# Patient Record
Sex: Male | Born: 1965 | Race: White | Hispanic: No | Smoking: Current some day smoker
Health system: Southern US, Community
[De-identification: ages and names within clinical notes are randomized; demographics above are authoritative.]

## PROBLEM LIST (undated history)

## (undated) DIAGNOSIS — N200 Calculus of kidney: Secondary | ICD-10-CM

## (undated) DIAGNOSIS — F1910 Other psychoactive substance abuse, uncomplicated: Secondary | ICD-10-CM

## (undated) DIAGNOSIS — F329 Major depressive disorder, single episode, unspecified: Secondary | ICD-10-CM

## (undated) DIAGNOSIS — F32A Depression, unspecified: Secondary | ICD-10-CM

## (undated) DIAGNOSIS — F4310 Post-traumatic stress disorder, unspecified: Secondary | ICD-10-CM

## (undated) DIAGNOSIS — E785 Hyperlipidemia, unspecified: Secondary | ICD-10-CM

## (undated) DIAGNOSIS — M545 Low back pain, unspecified: Secondary | ICD-10-CM

## (undated) DIAGNOSIS — F419 Anxiety disorder, unspecified: Secondary | ICD-10-CM

## (undated) DIAGNOSIS — J45909 Unspecified asthma, uncomplicated: Secondary | ICD-10-CM

## (undated) DIAGNOSIS — F1010 Alcohol abuse, uncomplicated: Secondary | ICD-10-CM

## (undated) DIAGNOSIS — J189 Pneumonia, unspecified organism: Secondary | ICD-10-CM

## (undated) HISTORY — PX: THUMB FUSION: SUR636

## (undated) HISTORY — PX: BACK SURGERY: SHX140

## (undated) HISTORY — PX: OPEN REDUCTION INTERNAL FIXATION (ORIF) HAND: SHX5991

---

## 2001-05-15 ENCOUNTER — Encounter: Payer: Self-pay | Admitting: Emergency Medicine

## 2001-05-15 ENCOUNTER — Emergency Department (HOSPITAL_COMMUNITY): Admission: EM | Admit: 2001-05-15 | Discharge: 2001-05-15 | Payer: Self-pay | Admitting: Emergency Medicine

## 2001-05-17 ENCOUNTER — Emergency Department (HOSPITAL_COMMUNITY): Admission: EM | Admit: 2001-05-17 | Discharge: 2001-05-17 | Payer: Self-pay | Admitting: Emergency Medicine

## 2002-11-08 ENCOUNTER — Ambulatory Visit (HOSPITAL_COMMUNITY): Admission: RE | Admit: 2002-11-08 | Discharge: 2002-11-08 | Payer: Self-pay | Admitting: *Deleted

## 2003-11-22 ENCOUNTER — Emergency Department (HOSPITAL_COMMUNITY): Admission: EM | Admit: 2003-11-22 | Discharge: 2003-11-22 | Payer: Self-pay | Admitting: Internal Medicine

## 2004-02-23 ENCOUNTER — Emergency Department (HOSPITAL_COMMUNITY): Admission: EM | Admit: 2004-02-23 | Discharge: 2004-02-23 | Payer: Self-pay | Admitting: Emergency Medicine

## 2004-12-02 ENCOUNTER — Emergency Department (HOSPITAL_COMMUNITY): Admission: EM | Admit: 2004-12-02 | Discharge: 2004-12-02 | Payer: Self-pay | Admitting: Emergency Medicine

## 2005-07-06 ENCOUNTER — Emergency Department (HOSPITAL_COMMUNITY): Admission: EM | Admit: 2005-07-06 | Discharge: 2005-07-06 | Payer: Self-pay | Admitting: Emergency Medicine

## 2005-10-25 ENCOUNTER — Ambulatory Visit (HOSPITAL_COMMUNITY): Admission: RE | Admit: 2005-10-25 | Discharge: 2005-10-25 | Payer: Self-pay | Admitting: Family Medicine

## 2006-09-05 ENCOUNTER — Emergency Department (HOSPITAL_COMMUNITY): Admission: EM | Admit: 2006-09-05 | Discharge: 2006-09-06 | Payer: Self-pay | Admitting: Emergency Medicine

## 2006-09-11 ENCOUNTER — Emergency Department (HOSPITAL_COMMUNITY): Admission: EM | Admit: 2006-09-11 | Discharge: 2006-09-11 | Payer: Self-pay | Admitting: Emergency Medicine

## 2008-12-25 ENCOUNTER — Emergency Department (HOSPITAL_COMMUNITY): Admission: EM | Admit: 2008-12-25 | Discharge: 2008-12-25 | Payer: Self-pay | Admitting: Emergency Medicine

## 2012-09-03 ENCOUNTER — Emergency Department (HOSPITAL_COMMUNITY)
Admission: EM | Admit: 2012-09-03 | Discharge: 2012-09-03 | Disposition: A | Discharge status: Home / Self Care | Payer: Worker's Compensation | Attending: Emergency Medicine | Admitting: Emergency Medicine

## 2012-09-03 ENCOUNTER — Emergency Department (HOSPITAL_COMMUNITY): Payer: Worker's Compensation

## 2012-09-03 ENCOUNTER — Encounter (HOSPITAL_COMMUNITY): Payer: Self-pay

## 2012-09-03 DIAGNOSIS — Y9389 Activity, other specified: Secondary | ICD-10-CM | POA: Insufficient documentation

## 2012-09-03 DIAGNOSIS — IMO0001 Reserved for inherently not codable concepts without codable children: Secondary | ICD-10-CM

## 2012-09-03 DIAGNOSIS — F17200 Nicotine dependence, unspecified, uncomplicated: Secondary | ICD-10-CM | POA: Insufficient documentation

## 2012-09-03 DIAGNOSIS — S62609A Fracture of unspecified phalanx of unspecified finger, initial encounter for closed fracture: Secondary | ICD-10-CM | POA: Insufficient documentation

## 2012-09-03 DIAGNOSIS — Y9289 Other specified places as the place of occurrence of the external cause: Secondary | ICD-10-CM | POA: Insufficient documentation

## 2012-09-03 DIAGNOSIS — X500XXA Overexertion from strenuous movement or load, initial encounter: Secondary | ICD-10-CM | POA: Insufficient documentation

## 2012-09-03 MED ORDER — IBUPROFEN 800 MG PO TABS: 800.0000 mg | ORAL_TABLET | Freq: Three times a day (TID) | ORAL | Status: DC

## 2012-09-03 MED ORDER — IBUPROFEN 800 MG PO TABS
800.0000 mg | ORAL_TABLET | Freq: Once | ORAL | Status: AC
  Administered 2012-09-03: 800 mg via ORAL
  Filled 2012-09-03: qty 1

## 2012-09-03 MED ORDER — OXYCODONE-ACETAMINOPHEN 5-325 MG PO TABS: 1.0000 | ORAL_TABLET | ORAL | Status: AC | PRN

## 2012-09-03 MED ORDER — OXYCODONE-ACETAMINOPHEN 5-325 MG PO TABS
1.0000 | ORAL_TABLET | Freq: Once | ORAL | Status: AC
  Administered 2012-09-03: 1 via ORAL
  Filled 2012-09-03: qty 1

## 2012-09-03 NOTE — ED Notes (Signed)
Pt reports was at work Friday and r thumb was bent backwards.  Swelling to thumb and at base of thumb.

## 2012-09-03 NOTE — ED Provider Notes (Signed)
History     CSN: [id number]  Arrival date & time 09/03/12  4098   First MD Initiated Contact with Patient 09/03/12 0920      Chief Complaint  Patient presents with  . Hand Pain    (Consider location/radiation/quality/duration/timing/severity/associated sxs/prior treatment) HPI Comments: Patient c/o pain and swelling to the right thumb for 3 days.  Pain began after a work related injury that caused a hyperextension of the joint. Patient had a heavy weight in his hand at the time of the injury.  States the pain radiates into his wrist.  He has not tried to apply ice, has taken aleve w/o relief of pain.  He denies numbness of the fingers, elbow or shoulder pain  Patient is a 46 y.o. male presenting with hand pain. The history is provided by the patient.  Hand Pain This is a new problem. The current episode started in the past 7 days. The problem occurs constantly. The problem has been unchanged. Associated symptoms include arthralgias and joint swelling. Pertinent negatives include no chest pain, chills, fever, neck pain, numbness, rash or weakness. The symptoms are aggravated by bending (movement and palpation). He has tried nothing for the symptoms. The treatment provided no relief.    History reviewed. No pertinent past medical history.  Past Surgical History  Procedure Date  . Back surgery     No family history on file.  History  Substance Use Topics  . Smoking status: Current Every Day Smoker  . Smokeless tobacco: Not on file  . Alcohol Use: Yes     Comment: occ-  drank heavy last night      Review of Systems  Constitutional: Negative for fever and chills.  HENT: Negative for neck pain.   Cardiovascular: Negative for chest pain.  Genitourinary: Negative for dysuria and difficulty urinating.  Musculoskeletal: Positive for joint swelling and arthralgias.  Skin: Negative for color change, rash and wound.  Neurological: Negative for weakness and numbness.  All other  systems reviewed and are negative.    Allergies  Penicillins  Home Medications   Current Outpatient Rx  Name  Route  Sig  Dispense  Refill  . ALBUTEROL SULFATE HFA 108 (90 BASE) MCG/ACT IN AERS   Inhalation   Inhale 2 puffs into the lungs every 6 (six) hours as needed. Asthma         . ASPIRIN 325 MG PO TBEC   Oral   Take 650 mg by mouth daily as needed. Pain         . ADULT MULTIVITAMIN W/MINERALS CH   Oral   Take 1 tablet by mouth daily.         Marland Kitchen NAPROXEN SODIUM 220 MG PO TABS   Oral   Take 220-440 mg by mouth 2 (two) times daily as needed. Pain           BP 130/79  Pulse 100  Temp 98.4 F (36.9 C) (Oral)  Resp 18  Ht 5\' 7"  (1.702 m)  Wt 175 lb (79.379 kg)  BMI 27.41 kg/m2  SpO2 100%  Physical Exam  Nursing note and vitals reviewed. Constitutional: He is oriented to person, place, and time. He appears well-developed and well-nourished. No distress.  HENT:  Head: Normocephalic and atraumatic.  Cardiovascular: Normal rate, regular rhythm and normal heart sounds.   Pulmonary/Chest: Effort normal and breath sounds normal.  Musculoskeletal: He exhibits edema and tenderness.       Right hand: He exhibits decreased range of motion, tenderness,  bony tenderness and swelling. He exhibits normal two-point discrimination, normal capillary refill, no deformity and no laceration. normal sensation noted. Normal strength noted.       Hands:      ttp of the base of the right thumb with moderate STS of the thenar eminence and dorsal surface of thumb and dorsal hand.  Radial pulse is brisk, distal sensation intact.  CR< 2 sec.    Neurological: He is alert and oriented to person, place, and time. He exhibits normal muscle tone. Coordination normal.  Skin: Skin is warm and dry.    ED Course  Procedures (including critical care time)  Labs Reviewed - No data to display Dg Wrist Complete Right  09/03/2012  *RADIOLOGY REPORT*  Clinical Data: Hand pain  RIGHT WRIST -  COMPLETE 3+ VIEW  Comparison:  None.  Findings: There is soft tissue swelling ventral to the distal radius suggesting a hematoma displacing the pronator fat pad.  There is a probable fracture at the base of the first metacarpal. While this could represent an ossicle, it has angular margins suggestive of fracture.  Correlate with pain location.  Chronic healed fractures of the fourth and fifth metacarpals.  IMPRESSION: Probable fracture at the base of the thumb which could be acute.  Chronic fracture fourth and fifth metacarpals.  Displaced pronator fat pad suggesting hematoma.   Original Report Authenticated By: Janeece Riggers, M.D.    Dg Finger Thumb Right  09/03/2012  *RADIOLOGY REPORT*  Clinical Data: Thumb pain after injury.  RIGHT THUMB 2+V  Comparison: None.  Findings: No acute osseous or joint abnormality.  IMPRESSION: No acute osseous or joint abnormality.   Original Report Authenticated By: Leanna Battles, M.D.      Fiberglass thumb spice splint applied, pain improved, remains NV intact.     MDM     Pt has a 3 day old injury to the base of the right thumb with significant swelling of the thenar eminence and dorsal aspect of the proximal thumb.  Radial pulse is brisk, CR < 3 sec.  Distal sensation intact.  I have advised pt that he will need close orthopedic follow-up.  Will give referral to hand surgeon.  Pt agrees to RICE therapy  Prescribed: Percocet #20 ibuprofen     Jamont Mellin L. [place], [place] 09/04/12 2127

## 2012-09-06 NOTE — ED Provider Notes (Signed)
Medical screening examination/treatment/procedure(s) were performed by non-physician practitioner and as supervising physician I was immediately available for consultation/collaboration. Devoria Albe, MD, FACEP   Reviewed xrays with PA  Ward Givens, MD 09/06/12 [phone]

## 2012-10-24 ENCOUNTER — Ambulatory Visit (HOSPITAL_COMMUNITY)
Admission: AD | Admit: 2012-10-24 | Discharge: 2012-10-24 | Disposition: A | Discharge status: Home / Self Care | Payer: Worker's Compensation | Source: Ambulatory Visit | Attending: Orthopedic Surgery | Admitting: Orthopedic Surgery

## 2012-10-24 ENCOUNTER — Ambulatory Visit (HOSPITAL_COMMUNITY): Payer: Worker's Compensation

## 2012-10-24 ENCOUNTER — Encounter (HOSPITAL_COMMUNITY)
Admission: AD | Disposition: A | Discharge status: Home / Self Care | Payer: Self-pay | Source: Ambulatory Visit | Attending: Orthopedic Surgery

## 2012-10-24 ENCOUNTER — Ambulatory Visit (HOSPITAL_COMMUNITY): Payer: Worker's Compensation | Admitting: Anesthesiology

## 2012-10-24 ENCOUNTER — Encounter (HOSPITAL_COMMUNITY): Payer: Self-pay | Admitting: Anesthesiology

## 2012-10-24 ENCOUNTER — Other Ambulatory Visit: Payer: Self-pay | Admitting: Orthopedic Surgery

## 2012-10-24 ENCOUNTER — Encounter (HOSPITAL_COMMUNITY): Payer: Self-pay | Admitting: *Deleted

## 2012-10-24 DIAGNOSIS — M86149 Other acute osteomyelitis, unspecified hand: Secondary | ICD-10-CM | POA: Insufficient documentation

## 2012-10-24 DIAGNOSIS — T847XXA Infection and inflammatory reaction due to other internal orthopedic prosthetic devices, implants and grafts, initial encounter: Secondary | ICD-10-CM | POA: Insufficient documentation

## 2012-10-24 DIAGNOSIS — Z79899 Other long term (current) drug therapy: Secondary | ICD-10-CM | POA: Insufficient documentation

## 2012-10-24 DIAGNOSIS — L0889 Other specified local infections of the skin and subcutaneous tissue: Secondary | ICD-10-CM | POA: Insufficient documentation

## 2012-10-24 DIAGNOSIS — Y838 Other surgical procedures as the cause of abnormal reaction of the patient, or of later complication, without mention of misadventure at the time of the procedure: Secondary | ICD-10-CM | POA: Insufficient documentation

## 2012-10-24 DIAGNOSIS — F17200 Nicotine dependence, unspecified, uncomplicated: Secondary | ICD-10-CM | POA: Insufficient documentation

## 2012-10-24 HISTORY — PX: I & D EXTREMITY: SHX5045

## 2012-10-24 LAB — CBC WITH DIFFERENTIAL/PLATELET
HCT: 42.8 % (ref 39.0–52.0)
Hemoglobin: 14.8 g/dL (ref 13.0–17.0)
Lymphocytes Relative: 22 % (ref 12–46)
Lymphs Abs: 2.2 10*3/uL (ref 0.7–4.0)
Monocytes Absolute: 0.8 10*3/uL (ref 0.1–1.0)
Monocytes Relative: 8 % (ref 3–12)
Neutro Abs: 7 10*3/uL (ref 1.7–7.7)
Neutrophils Relative %: 68 % (ref 43–77)
RBC: 4.86 MIL/uL (ref 4.22–5.81)

## 2012-10-24 LAB — BASIC METABOLIC PANEL
BUN: 11 mg/dL (ref 6–23)
CO2: 26 mEq/L (ref 19–32)
Chloride: 99 mEq/L (ref 96–112)
Creatinine, Ser: 0.73 mg/dL (ref 0.50–1.35)
GFR calc Af Amer: >90 mL/min (ref >90)
Glucose, Bld: 97 mg/dL (ref 70–99)
Potassium: 3.3 mEq/L — ABNORMAL LOW (ref 3.5–5.1)

## 2012-10-24 SURGERY — IRRIGATION AND DEBRIDEMENT EXTREMITY
Anesthesia: General | Site: Thumb | Laterality: Right | Wound class: Dirty or Infected

## 2012-10-24 MED ORDER — LIDOCAINE HCL 1 % IJ SOLN
INTRAMUSCULAR | Status: DC | PRN
  Administered 2012-10-24: 40 mg via INTRADERMAL

## 2012-10-24 MED ORDER — MUPIROCIN 2 % EX OINT
TOPICAL_OINTMENT | Freq: Two times a day (BID) | CUTANEOUS | Status: DC
  Administered 2012-10-24: 1 via NASAL

## 2012-10-24 MED ORDER — HYDROMORPHONE HCL PF 1 MG/ML IJ SOLN
INTRAMUSCULAR | Status: AC
  Filled 2012-10-24: qty 1

## 2012-10-24 MED ORDER — SODIUM CHLORIDE 0.9 % IR SOLN
Status: DC | PRN
  Administered 2012-10-24: 1000 mL
  Administered 2012-10-24: 3000 mL

## 2012-10-24 MED ORDER — MUPIROCIN 2 % EX OINT
TOPICAL_OINTMENT | CUTANEOUS | Status: AC
  Administered 2012-10-24: 1 via NASAL
  Filled 2012-10-24: qty 22

## 2012-10-24 MED ORDER — OXYCODONE HCL 5 MG/5ML PO SOLN: 5.0000 mg | Freq: Once | ORAL | Status: DC | PRN

## 2012-10-24 MED ORDER — LACTATED RINGERS IV SOLN
INTRAVENOUS | Status: DC | PRN
  Administered 2012-10-24 (×2): via INTRAVENOUS

## 2012-10-24 MED ORDER — FENTANYL CITRATE 0.05 MG/ML IJ SOLN
INTRAMUSCULAR | Status: DC | PRN
  Administered 2012-10-24 (×2): 50 ug via INTRAVENOUS
  Administered 2012-10-24: 25 ug via INTRAVENOUS
  Administered 2012-10-24: 50 ug via INTRAVENOUS
  Administered 2012-10-24: 25 ug via INTRAVENOUS
  Administered 2012-10-24: 50 ug via INTRAVENOUS

## 2012-10-24 MED ORDER — PHENYLEPHRINE HCL 10 MG/ML IJ SOLN
INTRAMUSCULAR | Status: DC | PRN
  Administered 2012-10-24 (×2): 40 ug via INTRAVENOUS
  Administered 2012-10-24: 80 ug via INTRAVENOUS

## 2012-10-24 MED ORDER — VECURONIUM BROMIDE 10 MG IV SOLR
INTRAVENOUS | Status: DC | PRN
  Administered 2012-10-24: 2 mg via INTRAVENOUS

## 2012-10-24 MED ORDER — MIDAZOLAM HCL 5 MG/5ML IJ SOLN
INTRAMUSCULAR | Status: DC | PRN
  Administered 2012-10-24: 2 mg via INTRAVENOUS

## 2012-10-24 MED ORDER — VANCOMYCIN HCL IN DEXTROSE 1-5 GM/200ML-% IV SOLN: 1000.0000 mg | INTRAVENOUS | Status: DC

## 2012-10-24 MED ORDER — VANCOMYCIN HCL IN DEXTROSE 1-5 GM/200ML-% IV SOLN
INTRAVENOUS | Status: AC
  Filled 2012-10-24: qty 200

## 2012-10-24 MED ORDER — VANCOMYCIN HCL 1000 MG IV SOLR
INTRAVENOUS | Status: DC | PRN
  Administered 2012-10-24: 1000 mg

## 2012-10-24 MED ORDER — GLYCOPYRROLATE 0.2 MG/ML IJ SOLN
INTRAMUSCULAR | Status: DC | PRN
  Administered 2012-10-24: 0.2 mg via INTRAVENOUS

## 2012-10-24 MED ORDER — NEOSTIGMINE METHYLSULFATE 1 MG/ML IJ SOLN
INTRAMUSCULAR | Status: DC | PRN
  Administered 2012-10-24: 1 mg via INTRAVENOUS

## 2012-10-24 MED ORDER — ONDANSETRON HCL 4 MG/2ML IJ SOLN
INTRAMUSCULAR | Status: DC | PRN
  Administered 2012-10-24: 4 mg via INTRAVENOUS

## 2012-10-24 MED ORDER — OXYCODONE HCL 5 MG PO TABS: 5.0000 mg | ORAL_TABLET | Freq: Once | ORAL | Status: DC | PRN

## 2012-10-24 MED ORDER — HYDROMORPHONE HCL PF 1 MG/ML IJ SOLN
0.2500 mg | INTRAMUSCULAR | Status: DC | PRN
  Administered 2012-10-24 (×4): 0.5 mg via INTRAVENOUS

## 2012-10-24 MED ORDER — PROPOFOL 10 MG/ML IV BOLUS
INTRAVENOUS | Status: DC | PRN
  Administered 2012-10-24: 160 mg via INTRAVENOUS

## 2012-10-24 MED ORDER — BUPIVACAINE HCL (PF) 0.25 % IJ SOLN
INTRAMUSCULAR | Status: DC | PRN
  Administered 2012-10-24: 8 mL

## 2012-10-24 MED ORDER — OXYCODONE-ACETAMINOPHEN 5-325 MG PO TABS: ORAL_TABLET | ORAL | Status: DC

## 2012-10-24 MED ORDER — SULFAMETHOXAZOLE-TRIMETHOPRIM 800-160 MG PO TABS: 1.0000 | ORAL_TABLET | Freq: Two times a day (BID) | ORAL | Status: DC

## 2012-10-24 MED ORDER — BUPIVACAINE HCL (PF) 0.25 % IJ SOLN
INTRAMUSCULAR | Status: AC
  Filled 2012-10-24: qty 30

## 2012-10-24 SURGICAL SUPPLY — 57 items
BANDAGE COBAN STERILE 2 (GAUZE/BANDAGES/DRESSINGS) IMPLANT
BANDAGE CONFORM 2  STR LF (GAUZE/BANDAGES/DRESSINGS) IMPLANT
BANDAGE ELASTIC 3 VELCRO ST LF (GAUZE/BANDAGES/DRESSINGS) ×2 IMPLANT
BANDAGE ELASTIC 4 VELCRO ST LF (GAUZE/BANDAGES/DRESSINGS) ×2 IMPLANT
BANDAGE GAUZE ELAST BULKY 4 IN (GAUZE/BANDAGES/DRESSINGS) ×2 IMPLANT
BNDG COHESIVE 1X5 TAN STRL LF (GAUZE/BANDAGES/DRESSINGS) IMPLANT
BNDG ESMARK 4X9 LF (GAUZE/BANDAGES/DRESSINGS) ×2 IMPLANT
CANISTER SUCTION 1500CC (MISCELLANEOUS) ×2 IMPLANT
CLOTH BEACON ORANGE TIMEOUT ST (SAFETY) ×2 IMPLANT
CORDS BIPOLAR (ELECTRODE) ×2 IMPLANT
COVER SURGICAL LIGHT HANDLE (MISCELLANEOUS) ×2 IMPLANT
DECANTER SPIKE VIAL GLASS SM (MISCELLANEOUS) IMPLANT
DRAIN PENROSE 1/4X12 LTX STRL (WOUND CARE) IMPLANT
DRSG ADAPTIC 3X8 NADH LF (GAUZE/BANDAGES/DRESSINGS) IMPLANT
DRSG EMULSION OIL 3X3 NADH (GAUZE/BANDAGES/DRESSINGS) ×2 IMPLANT
DRSG PAD ABDOMINAL 8X10 ST (GAUZE/BANDAGES/DRESSINGS) ×4 IMPLANT
GAUZE PACKING IODOFORM 1/4X5 (PACKING) ×2 IMPLANT
GAUZE XEROFORM 1X8 LF (GAUZE/BANDAGES/DRESSINGS) ×2 IMPLANT
GLOVE BIO SURGEON STRL SZ7.5 (GLOVE) ×2 IMPLANT
GLOVE BIOGEL PI IND STRL 8 (GLOVE) ×1 IMPLANT
GLOVE BIOGEL PI INDICATOR 8 (GLOVE) ×1
GOWN STRL REIN XL XLG (GOWN DISPOSABLE) ×2 IMPLANT
HANDPIECE INTERPULSE COAX TIP (DISPOSABLE)
KIT BASIN OR (CUSTOM PROCEDURE TRAY) ×2 IMPLANT
KIT ROOM TURNOVER OR (KITS) ×2 IMPLANT
LOOP VESSEL MAXI BLUE (MISCELLANEOUS) IMPLANT
LOOP VESSEL MINI RED (MISCELLANEOUS) IMPLANT
MANIFOLD NEPTUNE II (INSTRUMENTS) ×2 IMPLANT
NEEDLE HYPO 25X1 1.5 SAFETY (NEEDLE) ×2 IMPLANT
NS IRRIG 1000ML POUR BTL (IV SOLUTION) ×2 IMPLANT
PACK ORTHO EXTREMITY (CUSTOM PROCEDURE TRAY) ×2 IMPLANT
PAD ARMBOARD 7.5X6 YLW CONV (MISCELLANEOUS) ×2 IMPLANT
PADDING CAST ABS 3INX4YD NS (CAST SUPPLIES) ×1
PADDING CAST ABS COTTON 3X4 (CAST SUPPLIES) ×1 IMPLANT
SCRUB BETADINE 4OZ XXX (MISCELLANEOUS) ×2 IMPLANT
SET HNDPC FAN SPRY TIP SCT (DISPOSABLE) IMPLANT
SOLUTION BETADINE 4OZ (MISCELLANEOUS) ×2 IMPLANT
SPLINT PLASTER EXTRA FAST 3X15 (CAST SUPPLIES) ×1
SPLINT PLASTER GYPS XFAST 3X15 (CAST SUPPLIES) ×1 IMPLANT
SPONGE GAUZE 4X4 12PLY (GAUZE/BANDAGES/DRESSINGS) ×2 IMPLANT
SPONGE LAP 18X18 X RAY DECT (DISPOSABLE) IMPLANT
SPONGE LAP 4X18 X RAY DECT (DISPOSABLE) ×2 IMPLANT
SUCTION FRAZIER TIP 10 FR DISP (SUCTIONS) ×2 IMPLANT
SUT ETHILON 4 0 PS 2 18 (SUTURE) ×2 IMPLANT
SUT MON AB 5-0 P3 18 (SUTURE) IMPLANT
SWAB CULTURE LIQUID MINI MALE (MISCELLANEOUS) ×4 IMPLANT
SYR 20CC LL (SYRINGE) ×2 IMPLANT
SYR CONTROL 10ML LL (SYRINGE) ×2 IMPLANT
TOWEL OR 17X24 6PK STRL BLUE (TOWEL DISPOSABLE) ×2 IMPLANT
TOWEL OR 17X26 10 PK STRL BLUE (TOWEL DISPOSABLE) ×2 IMPLANT
TUBE ANAEROBIC SPECIMEN COL (MISCELLANEOUS) ×4 IMPLANT
TUBE CONNECTING 12X1/4 (SUCTIONS) ×2 IMPLANT
TUBE FEEDING 5FR 15 INCH (TUBING) IMPLANT
TUBING CYSTO DISP (UROLOGICAL SUPPLIES) ×2 IMPLANT
UNDERPAD 30X30 INCONTINENT (UNDERPADS AND DIAPERS) ×2 IMPLANT
WATER STERILE IRR 1000ML POUR (IV SOLUTION) IMPLANT
YANKAUER SUCT BULB TIP NO VENT (SUCTIONS) IMPLANT

## 2012-10-24 NOTE — Anesthesia Postprocedure Evaluation (Signed)
  Anesthesia Post-op Note  Patient: Jacob Reilly  Procedure(s) Performed: Procedure(s) (LRB) with comments: IRRIGATION AND DEBRIDEMENT EXTREMITY (Right)  Patient Location: PACU  Anesthesia Type:General  Level of Consciousness: awake, alert  and oriented  Airway and Oxygen Therapy: Patient Spontanous Breathing  Post-op Pain: mild  Post-op Assessment: Post-op Vital signs reviewed, Patient's Cardiovascular Status Stable, Respiratory Function Stable, Patent Airway and No signs of Nausea or vomiting  Post-op Vital Signs: Reviewed and stable  Complications: No apparent anesthesia complications

## 2012-10-24 NOTE — Transfer of Care (Signed)
Immediate Anesthesia Transfer of Care Note  Patient: Jacob Reilly  Procedure(s) Performed: Procedure(s) (LRB) with comments: IRRIGATION AND DEBRIDEMENT EXTREMITY (Right)  Patient Location: PACU  Anesthesia Type:General  Level of Consciousness: awake, alert  and oriented  Airway & Oxygen Therapy: Patient Spontanous Breathing and Patient connected to face mask oxygen  Post-op Assessment: Report given to PACU RN, Post -op Vital signs reviewed and stable and Patient moving all extremities  Post vital signs: Reviewed and stable  Complications: No apparent anesthesia complications

## 2012-10-24 NOTE — Preoperative (Signed)
Beta Blockers   Reason not to administer Beta Blockers:Not Applicable 

## 2012-10-24 NOTE — Brief Op Note (Signed)
10/24/2012  9:36 PM  PATIENT:  Jacob Reilly  46 y.o. male  PRE-OPERATIVE DIAGNOSIS:  Right Thumb CMC Joint Infection  POST-OPERATIVE DIAGNOSIS:  Right Thumb CMC Joint Infection  PROCEDURE:  Procedure(s) (LRB) with comments: IRRIGATION AND DEBRIDEMENT EXTREMITY (Right)  SURGEON:  Surgeon(s) and Role:    * Tami Ribas, MD - Primary  PHYSICIAN ASSISTANT:   ASSISTANTS: none   ANESTHESIA:   general  EBL:     BLOOD ADMINISTERED:none  DRAINS: Vessel loop drain in thumb metacarpal and cmc joint, iodoform packing in wound  LOCAL MEDICATIONS USED:  MARCAINE     SPECIMEN:  Source of Specimen:  right thumb abscess and thumb metacarpal   DISPOSITION OF SPECIMEN:  micro  COUNTS:  YES  TOURNIQUET:  * Missing tourniquet times found for documented tourniquets in log:  [id number] *  DICTATION: .Other Dictation: Dictation Number [id number]  PLAN OF CARE: Discharge to home after PACU  PATIENT DISPOSITION:  PACU - hemodynamically stable.

## 2012-10-24 NOTE — Anesthesia Preprocedure Evaluation (Signed)
Anesthesia Evaluation  Patient identified by MRN, date of birth, ID band Patient awake    Reviewed: Allergy & Precautions, H&P , NPO status , Patient's Chart, lab work & pertinent test results  Airway Mallampati: I TM Distance: >3 FB Neck ROM: Full    Dental No notable dental hx. (+) Teeth Intact and Dental Advisory Given   Pulmonary neg pulmonary ROS,  breath sounds clear to auscultation  Pulmonary exam normal       Cardiovascular negative cardio ROS  Rhythm:Regular Rate:Normal     Neuro/Psych negative neurological ROS  negative psych ROS   GI/Hepatic negative GI ROS, Neg liver ROS,   Endo/Other  negative endocrine ROS  Renal/GU negative Renal ROS  negative genitourinary   Musculoskeletal   Abdominal   Peds  Hematology negative hematology ROS (+)   Anesthesia Other Findings   Reproductive/Obstetrics negative OB ROS                           Anesthesia Physical Anesthesia Plan  ASA: I and emergent  Anesthesia Plan: General   Post-op Pain Management:    Induction: Intravenous, Rapid sequence and Cricoid pressure planned  Airway Management Planned: Oral ETT  Additional Equipment:   Intra-op Plan:   Post-operative Plan: Extubation in OR  Informed Consent: I have reviewed the patients History and Physical, chart, labs and discussed the procedure including the risks, benefits and alternatives for the proposed anesthesia with the patient or authorized representative who has indicated his/her understanding and acceptance.   Dental advisory given  Plan Discussed with: CRNA  Anesthesia Plan Comments:         Anesthesia Quick Evaluation

## 2012-10-24 NOTE — Op Note (Signed)
Dictation [id number]

## 2012-10-24 NOTE — H&P (Signed)
  Jacob Reilly is an 47 y.o. male.   Chief Complaint: right thumb infection HPI: 47 yo male 3 weeks s/p right thumb crpp for mc base fracture.  Got dressing wet and pin came out three days ago.  States thumb began to swell and redden and become painful over last two days.  Presented to office this afternoon.  No fevers, chills, night sweats.  History reviewed. No pertinent past medical history.  Past Surgical History  Procedure Date  . Back surgery     History reviewed. No pertinent family history. Social History:  reports that he has been smoking.  He does not have any smokeless tobacco history on file. He reports that he drinks alcohol. He reports that he does not use illicit drugs.  Allergies:  Allergies  Allergen Reactions  . Penicillins Other (See Comments)    Childhood Allergy.     Medications Prior to Admission  Medication Sig Dispense Refill  . albuterol (PROVENTIL HFA;VENTOLIN HFA) 108 (90 BASE) MCG/ACT inhaler Inhale 2 puffs into the lungs every 6 (six) hours as needed. Asthma      . aspirin 325 MG EC tablet Take 650 mg by mouth daily as needed. Pain      . ibuprofen (ADVIL,MOTRIN) 800 MG tablet Take 1 tablet (800 mg total) by mouth 3 (three) times daily.  21 tablet  0  . Multiple Vitamin (MULTIVITAMIN WITH MINERALS) TABS Take 1 tablet by mouth daily.      . naproxen sodium (ALEVE) 220 MG tablet Take 220-440 mg by mouth 2 (two) times daily as needed. Pain        No results found for this or any previous visit (from the past 48 hour(s)).  No results found.   A comprehensive review of systems was negative except for: Respiratory: positive for asthma  There were no vitals taken for this visit.  General appearance: alert, cooperative and appears stated age Head: Normocephalic, without obvious abnormality, atraumatic Neck: supple, symmetrical, trachea midline Resp: clear to auscultation bilaterally Cardio: regular rate and rhythm GI: non tender Extremities: light  touch sensation and capillary refill intact all digits.  +epl/fpl/io.  erythema of dorsum of thumb and around pin site.  swelling in thumb and thenar eminence.  ttp at pin site primarily and in thumb over mc and cmc.  no proximal streaks.   Pulses: 2+ and symmetric Skin: as above Neurologic: Grossly normal Incision/Wound: As above  Assessment/Plan Right thumb pin site infection.  Recommend I&D in OR.  Risks, benefits, and alternatives of surgery were discussed and the patient agrees with the plan of care.   Gargi Berch R 10/24/2012, 7:10 PM

## 2012-10-25 ENCOUNTER — Encounter (HOSPITAL_COMMUNITY): Payer: Self-pay | Admitting: Orthopedic Surgery

## 2012-10-25 NOTE — Op Note (Signed)
Jacob Reilly, Jacob Reilly               ACCOUNT NO.:  [id number]  MEDICAL RECORD NO.:  [id number]  LOCATION:  [facility]                         FACILITY:  [facility]  PHYSICIAN:  Betha Loa, MD        DATE OF BIRTH:  Feb 22, 1966  DATE OF PROCEDURE:  10/24/2012 DATE OF DISCHARGE:  10/24/2012                              OPERATIVE REPORT   PREOPERATIVE DIAGNOSIS:  Right thumb infection status post closed reduction and percutaneous pinning.  POSTOPERATIVE DIAGNOSIS:  Right thumb infection with osteomyelitis and CMC joint infection.  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  SURGEON:  Betha Loa, MD.  ASSISTANT:  None.  ANESTHESIA:  General.  IV FLUIDS:  Per anesthesia flow sheet.  ESTIMATED BLOOD LOSS:  Minimal.  COMPLICATIONS:  None.  SPECIMENS:  Cultures from thumb wound and metacarpal to micro for examination.  TOURNIQUET TIME:  48 minutes.  DISPOSITION:  Stable to PACU.  INDICATIONS:  [name] is a 47 year old male who underwent closed reduction, percutaneous pinning for a right thumb metacarpal base fracture by Dr. Mina Marble approximately 3 weeks ago.  One of his pins came out a couple of weeks ago and his second pin came out 3 days ago. He began to notice swelling, erythema, and pain in the thumb.  He presented to the office today.  He has had no fevers, chills, night sweats.  On examination, he had intact sensation and capillary refill in the thumb.  He had a swollen erythematous thumb and thenar eminence.  He was tender to palpation at the pin site and somewhat at the [facility] joint of the thumb.  Radiographs showed a small lucency at the base of the metacarpal.  Recommended [name] going to the operating room for irrigation and debridement of the thumb.  Risks, benefits, alternatives of the surgery were discussed including risk of blood loss, infection; damage to nerves, vessels, tendons,  ligaments, bone; failure of surgery; need for additional surgery; complications with wound healing; continued pain; continued infection; need for repeat irrigation and debridement. They voiced understanding of these risks and elected to proceed.  OPERATIVE COURSE:  After being identified preoperatively by myself, the patient and I agreed upon procedure and site procedure.  Surgical site was marked.  The risks, benefits, and alternatives of surgery were reviewed and wished to proceed.  Surgical consent had been signed. Antibiotics were held for cultures.  He was transferred to the operating room and placed on the operating room table in supine position with the right upper extremity on arm board.  General anesthesia was induced by the anesthesiologist.  The right upper extremity was prepped and draped in normal sterile orthopedic fashion.  Surgical pause was performed between surgeons, anesthesia, operating staff, and all were in agreement as to the patient, procedure, and site of procedure.  Tourniquet was at the proximal aspect of the arm was inflated to 250 mmHg after exsanguination of the forearm with an Esmarch bandage.  The hand was exsanguinated by gravity.  An incision was made at the pin site in the longitudinal fashion.  This was carried into subcutaneous tissues by spreading technique.  There was some cloudy fluid.  No distinct gross purulence.  Cultures were taken.  The tract was followed down.  It coursed beside the EPL tendon on the ulnar side.  It was found tract down to the bone.  The pin site in the bone was identified.  It was very soft.  Curette was able to be introduced into the bone very easily. There was some purulence within the bone.  This debrided using the curette.  It was sent for cultures.  The thenar eminence was entered from the ulnar side of the thumb at the MP joint.  There was no gross purulence in the musculature.  While irrigating, it was noted that  the [facility] joint was insufflating and was unstable.  It was felt that the pin tract communicated with the [facility] joint.  An additional incision was made over the [facility] joint and carried into subcutaneous tissues by spreading technique.  The sheath of the APL and EPB tendons was released at the radial side.  The [facility] joint was entered.  There was cloudy fluid within the joint.  This was irrigated as well as the bony lesion by Angiocath needle.  3000 mL of sterile saline was irrigated through the wounds and CMC joint and metacarpal lesion.  The wounds were all packed with quarter-inch iodoform gauze.  A vessel loop drain was placed in the pin tract and 2 vessel loop drains placed in the [facility] joint.  The wounds were packed with Iodoform.  They were injected with 8 mL of 0.25% plain Marcaine to aid in postoperative analgesia.  They were then dressed sterile Xeroform, 4x4s, and wrapped with a Kerlix bandage.  A thumb spica splint was placed and wrapped with Kerlix and Ace bandage. Tourniquet was deflated at 48 minutes.  The fingertips were pink with brisk capillary refill after deflation of tourniquet.  Operative drapes were broken down.  The patient was awoken from anesthesia safely.  He had been given a gram of IV vancomycin in the operating room after cultures had been taken.  He was transferred back to stretcher and taken to PACU in stable condition.  He will be seen back in the office in 2 days for postoperative care and initiation of hydrotherapy.  We will give him Bactrim DS 1 p.o. b.i.d. x10 days at this time.  Percocet 5/325 one to two p.o. q.6 hours p.r.n. pain, dispensed #40.     Betha Loa, MD     KK/MEDQ  D:  10/24/2012  T:  10/25/2012  Job:  [id number]

## 2012-10-27 LAB — TISSUE CULTURE: Gram Stain: NONE SEEN

## 2012-10-27 LAB — CULTURE, ROUTINE-ABSCESS

## 2012-10-30 LAB — ANAEROBIC CULTURE
Gram Stain: NONE SEEN
Gram Stain: NONE SEEN

## 2012-11-26 ENCOUNTER — Encounter: Payer: Self-pay | Admitting: Internal Medicine

## 2012-11-26 ENCOUNTER — Ambulatory Visit (INDEPENDENT_AMBULATORY_CARE_PROVIDER_SITE_OTHER): Payer: Worker's Compensation | Admitting: Internal Medicine

## 2012-11-26 VITALS — BP 127/75 | HR 86 | Temp 98.0°F | Wt 170.0 lb

## 2012-11-26 DIAGNOSIS — I714 Abdominal aortic aneurysm, without rupture: Secondary | ICD-10-CM

## 2012-11-26 DIAGNOSIS — M9080 Osteopathy in diseases classified elsewhere, unspecified site: Secondary | ICD-10-CM

## 2012-11-26 DIAGNOSIS — E1169 Type 2 diabetes mellitus with other specified complication: Secondary | ICD-10-CM

## 2012-11-26 DIAGNOSIS — M869 Osteomyelitis, unspecified: Secondary | ICD-10-CM

## 2012-11-26 LAB — CBC WITH DIFFERENTIAL/PLATELET
Eosinophils Relative: 4 % (ref 0–5)
HCT: 42.2 % (ref 39.0–52.0)
Hemoglobin: 14.6 g/dL (ref 13.0–17.0)
Lymphocytes Relative: 25 % (ref 12–46)
Lymphs Abs: 1.7 10*3/uL (ref 0.7–4.0)
MCV: 88.5 fL (ref 78.0–100.0)
Monocytes Absolute: 0.5 10*3/uL (ref 0.1–1.0)
Monocytes Relative: 8 % (ref 3–12)
Platelets: 311 10*3/uL (ref 150–400)
RBC: 4.77 MIL/uL (ref 4.22–5.81)
WBC: 6.9 10*3/uL (ref 4.0–10.5)

## 2012-11-26 LAB — COMPLETE METABOLIC PANEL WITH GFR
ALT: 18 U/L (ref 0–53)
AST: 15 U/L (ref 0–37)
Alkaline Phosphatase: 72 U/L (ref 39–117)
BUN: 11 mg/dL (ref 6–23)
Creat: 0.87 mg/dL (ref 0.50–1.35)
Total Bilirubin: 0.6 mg/dL (ref 0.3–1.2)

## 2012-11-26 LAB — SEDIMENTATION RATE: Sed Rate: 1 mm/hr (ref 0–16)

## 2012-11-26 LAB — C-REACTIVE PROTEIN: CRP: <0.5 mg/dL (ref <0.60)

## 2012-11-26 MED ORDER — RIFAMPIN 300 MG PO CAPS: 300.0000 mg | ORAL_CAPSULE | Freq: Two times a day (BID) | ORAL | Status: DC

## 2012-11-26 MED ORDER — SULFAMETHOXAZOLE-TMP DS 800-160 MG PO TABS: 2.0000 | ORAL_TABLET | Freq: Two times a day (BID) | ORAL | Status: DC

## 2012-11-26 NOTE — Assessment & Plan Note (Signed)
He developed acute osteomyelitis and is one month into antibiotic therapy with Bactrim DS 1 tab bid and is currently responding to treatment.  I am going to increase his Bactrim dose to 2 DS BID and add rifampin 300 mg bid, both sensitive to MRSA.  I anticipate 2-3 months of therapy for osteomyelitis.  I am going to check CRP and ESR today and CMP to assure no side effects from Bactrim.  He will follow up with [place] here next month.

## 2012-11-26 NOTE — Progress Notes (Signed)
  Subjective:    Patient ID: Jacob Reilly, male    DOB: 08/13/1966, 47 y.o.   MRN: [id number]  HPI He comes in for a new patient evaluation with acute osteomyelitis of his right thumb.  He initially had a broken thumb in mid-December 2013 that was surgically repaired by Dr. Mina Marble at that time and had two pins placed.  One of the pins came out inadvertently and he then developed swelling and redness. On January 8, he underwent debridement and felt likely c/w osteomyelitis.  He was started on BActrim empirically and then the culture grew CA-MRSA, sensitive to Bactrim.  Since the surgery about 1 month ago, his wound has healed and edema and erythema have pretty much resolved.  No fever or chills.     Review of Systems  Constitutional: Negative for fever and chills.  Gastrointestinal: Negative for nausea, abdominal pain and diarrhea.  Genitourinary: Negative for difficulty urinating.  Musculoskeletal:       Thumb improving       Objective:   Physical Exam  Constitutional: He appears well-developed and well-nourished. No distress.  Musculoskeletal:  Right thumb with well healed scar.  No significant erythema or edema.  ROM is diminished, but improved according to the patient          Assessment & Plan:

## 2012-12-24 ENCOUNTER — Ambulatory Visit (INDEPENDENT_AMBULATORY_CARE_PROVIDER_SITE_OTHER): Payer: Worker's Compensation | Admitting: Infectious Disease

## 2012-12-24 ENCOUNTER — Ambulatory Visit (HOSPITAL_COMMUNITY)
Admission: RE | Admit: 2012-12-24 | Discharge: 2012-12-24 | Disposition: A | Discharge status: Home / Self Care | Payer: Worker's Compensation | Source: Ambulatory Visit | Attending: Infectious Disease | Admitting: Infectious Disease

## 2012-12-24 ENCOUNTER — Other Ambulatory Visit: Payer: Self-pay | Admitting: Infectious Disease

## 2012-12-24 ENCOUNTER — Encounter: Payer: Self-pay | Admitting: Infectious Disease

## 2012-12-24 VITALS — BP 122/79 | HR 109 | Temp 100.1°F | Wt 162.0 lb

## 2012-12-24 DIAGNOSIS — Y838 Other surgical procedures as the cause of abnormal reaction of the patient, or of later complication, without mention of misadventure at the time of the procedure: Secondary | ICD-10-CM | POA: Insufficient documentation

## 2012-12-24 DIAGNOSIS — F17200 Nicotine dependence, unspecified, uncomplicated: Secondary | ICD-10-CM

## 2012-12-24 DIAGNOSIS — Z7289 Other problems related to lifestyle: Secondary | ICD-10-CM

## 2012-12-24 DIAGNOSIS — T8140XA Infection following a procedure, unspecified, initial encounter: Secondary | ICD-10-CM | POA: Insufficient documentation

## 2012-12-24 DIAGNOSIS — T365X1A Poisoning by aminoglycosides, accidental (unintentional), initial encounter: Secondary | ICD-10-CM

## 2012-12-24 DIAGNOSIS — T3691XA Poisoning by unspecified systemic antibiotic, accidental (unintentional), initial encounter: Secondary | ICD-10-CM

## 2012-12-24 DIAGNOSIS — R509 Fever, unspecified: Secondary | ICD-10-CM

## 2012-12-24 DIAGNOSIS — A4902 Methicillin resistant Staphylococcus aureus infection, unspecified site: Secondary | ICD-10-CM | POA: Insufficient documentation

## 2012-12-24 DIAGNOSIS — R937 Abnormal findings on diagnostic imaging of other parts of musculoskeletal system: Secondary | ICD-10-CM | POA: Insufficient documentation

## 2012-12-24 DIAGNOSIS — R42 Dizziness and giddiness: Secondary | ICD-10-CM | POA: Insufficient documentation

## 2012-12-24 DIAGNOSIS — F1010 Alcohol abuse, uncomplicated: Secondary | ICD-10-CM

## 2012-12-24 NOTE — Progress Notes (Signed)
Subjective:    Patient ID: Jacob Reilly, male    DOB: 1965-11-20, 47 y.o.   MRN: [id number]  Fever  Associated symptoms include nausea and vomiting. Pertinent negatives include no abdominal pain, chest pain, congestion, coughing, diarrhea, rash, sore throat or wheezing.    47 year old with hx of  acute osteomyelitis of his right thumb. He initially had a broken thumb in mid-December 2013 that was surgically repaired by Dr. Mina Marble at that time and had two pins placed. One of the pins came out inadvertently and he then developed swelling and redness. On January 8, he underwent debridement and felt likely c/w osteomyelitis. He was started on BActrim empirically and then the culture grew CA-MRSA, sensitive to Bactrim, R to tetracycline. Since the surgeryhis wound has healed and edema and erythema had resolved. The patient was seen by Dr Luciana Axe in February at which time labs including ESR and CRP were completely normal. Pts dose of bactrim was escalated tot TWO DS bid and rifampin 300mg  bid was added. Since then the pt had done well until last Thursday when he developed acute onset of fever to 103, myalgias, malaise. He had one episode of nonbloody, nobilious emesis. He has not had diarrhea. He denies cough, sinus congestion. He has been suffering from HA with fever and orthostatic dizziness.   During the past month he has continued to drink etoh drinking 5 or more beers a day but at times not drinking for up to 3 days. He did not understand prohibition of etoh with rifampin. He lives alone and has a girlfriend. There have been no sick contacts. I spent greater than 45 minutes with the patient including greater than 50% of time in face to face counsel of the patient and in coordination of their care.    Review of Systems  Constitutional: Positive for fever, appetite change and fatigue. Negative for chills, diaphoresis, activity change and unexpected weight change.  HENT: Negative for congestion, sore  throat, rhinorrhea, sneezing, trouble swallowing and sinus pressure.   Eyes: Negative for photophobia and visual disturbance.  Respiratory: Negative for cough, chest tightness, shortness of breath, wheezing and stridor.   Cardiovascular: Negative for chest pain, palpitations and leg swelling.  Gastrointestinal: Positive for nausea and vomiting. Negative for abdominal pain, diarrhea, constipation, blood in stool, abdominal distention and anal bleeding.  Genitourinary: Negative for dysuria, hematuria, flank pain and difficulty urinating.  Musculoskeletal: Negative for myalgias, back pain, joint swelling, arthralgias and gait problem.  Skin: Negative for color change, pallor, rash and wound.  Neurological: Positive for dizziness, weakness and light-headedness. Negative for tremors.  Hematological: Negative for adenopathy. Does not bruise/bleed easily.  Psychiatric/Behavioral: Negative for behavioral problems, confusion, sleep disturbance, dysphoric mood, decreased concentration and agitation.       Objective:   Physical Exam  Constitutional: He is oriented to person, place, and time. He appears well-nourished. No distress.  HENT:  Head: Normocephalic and atraumatic.  Mouth/Throat: Oropharynx is clear and moist. No oropharyngeal exudate.  Eyes: Conjunctivae and EOM are normal. Pupils are equal, round, and reactive to light. No scleral icterus.  Neck: Normal range of motion. Neck supple. No JVD present.  Cardiovascular: Normal rate, regular rhythm and normal heart sounds.  Exam reveals no gallop and no friction rub.   No murmur heard. Pulmonary/Chest: Effort normal and breath sounds normal. No respiratory distress. He has no wheezes. He has no rales. He exhibits no tenderness.  Abdominal: He exhibits no distension and no mass. There is no  tenderness. There is no rebound and no guarding.  Musculoskeletal: He exhibits no edema and no tenderness.       Arms: Lymphadenopathy:    He has no  cervical adenopathy.  Neurological: He is alert and oriented to person, place, and time. He exhibits normal muscle tone. Coordination normal.  Skin: Skin is warm and dry. He is not diaphoretic. No erythema. No pallor.  Psychiatric: He has a normal mood and affect. His behavior is normal. Judgment and thought content normal.          Assessment & Plan:  Fever: differential includes viral illness flu seems less likely given recent trends but is possible. I did not initiate tamiflu because he seems to be getting better and it would only make a difference this point if he were to worsen and require hospitalization. Obviously the other major concern would be his MRSA infection in his arm could have recurred or that he could even be bacteermic. Finally there is the possibility of drug toxicity and esp rifampin induced fever and hepatotoxicity esp given his etoh consumption  --I will check stat cbc, cmp, esr, crp, blood cultures x2 and plain films of the wrist and hand --dc rifampin   Light headedness, dizziness. HR went up by 20 but pulse did not drop. I am worried about dehydration and prerenal renal insufficiency. See above, drop bactrim dose to 1 bid dc rifampin. If CR is up subtantially may need to send him to ED/[facility] for IVF  Nausea and vomiting: see above, stop rifampin  EToh use: unlikely to be able to completely stop now, but would be good if he could cut back carefully  Osteomyelitis: pins are out and esr, crp were normal. Again bothersome is his recent illness. (see above) will continue bactrim and renally adjust if necessary

## 2012-12-24 NOTE — Patient Instructions (Addendum)
I would like to get blood work today  I would also like to get a repeat xray of your right wrist and hand  Please STOP the rifampin  Drink plenty of fluids  Stop taking any of the following ibuprofen, aleve, aspirin until notified by [place]  Tylenol is fine for your fevers  Drop your dose of the double strength bactrim to one tablet twice daily  I will call you back if it is OK to go back up to the higher dose of bactrim TWO DS twice daily  If your labs are concerning (for ex your kidney fxn is much worse) we may have to hospitalize you here at [facility]

## 2012-12-26 LAB — CULTURE, BLOOD (SINGLE)
Preliminary Report: NEGATIVE
Preliminary Report: NO GROWTH

## 2012-12-27 ENCOUNTER — Other Ambulatory Visit: Payer: Self-pay | Admitting: Infectious Disease

## 2013-01-28 ENCOUNTER — Ambulatory Visit (INDEPENDENT_AMBULATORY_CARE_PROVIDER_SITE_OTHER): Payer: Worker's Compensation | Admitting: Infectious Diseases

## 2013-01-28 ENCOUNTER — Other Ambulatory Visit: Payer: Self-pay | Admitting: Licensed Clinical Social Worker

## 2013-01-28 VITALS — BP 121/93 | Temp 98.6°F | Wt 166.0 lb

## 2013-01-28 DIAGNOSIS — M869 Osteomyelitis, unspecified: Secondary | ICD-10-CM

## 2013-01-28 DIAGNOSIS — A4902 Methicillin resistant Staphylococcus aureus infection, unspecified site: Secondary | ICD-10-CM

## 2013-01-28 DIAGNOSIS — Z7289 Other problems related to lifestyle: Secondary | ICD-10-CM

## 2013-01-28 DIAGNOSIS — F17200 Nicotine dependence, unspecified, uncomplicated: Secondary | ICD-10-CM

## 2013-01-28 DIAGNOSIS — F1010 Alcohol abuse, uncomplicated: Secondary | ICD-10-CM

## 2013-01-28 LAB — CBC
Hemoglobin: 14.2 g/dL (ref 13.0–17.0)
MCH: 30 pg (ref 26.0–34.0)
MCHC: 34.1 g/dL (ref 30.0–36.0)

## 2013-01-28 LAB — SEDIMENTATION RATE: Sed Rate: 4 mm/hr (ref 0–16)

## 2013-01-28 MED ORDER — RIFAMPIN 300 MG PO CAPS: 300.0000 mg | ORAL_CAPSULE | Freq: Two times a day (BID) | ORAL | Status: DC

## 2013-01-28 NOTE — Addendum Note (Signed)
Addended by: Starleen Arms D on: 01/28/2013 10:14 AM   Modules accepted: Orders

## 2013-01-28 NOTE — Assessment & Plan Note (Addendum)
Encouraged to quit, is on patch now.

## 2013-01-28 NOTE — Progress Notes (Signed)
  Subjective:    Patient ID: Jacob Reilly, male    DOB: 1966/10/04, 47 y.o.   MRN: [id number]  HPI 47 year old with hx of acute osteomyelitis of his right thumb. He initially had a broken thumb in mid-December 2013 that was surgically repaired by Dr. Mina Marble at that time and had two pins placed. One of the pins came out inadvertently and he then developed swelling and redness. On January 8, he underwent debridement and felt likely c/w osteomyelitis. He was started on Bactrim and the culture grew CA-MRSA, sensitive to Bactrim, R to tetracycline. Since the surgery, his wound has healed and edema and erythema had resolved. The patient was seen by Dr Luciana Axe in February at which time labs including ESR and CRP were completely normal. Pts dose of bactrim was escalated tot TWO DS bid and rifampin 300mg  bid was added. He did well until early March when developed f/, myalgias, n/v. He was seen in ID clinic (noted to have ETOH use), stopped his rifampin and decreased his bactrim dose. His sx have completely resolved and he has increased his anbx doses on his own. 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  He is awaiting repeat surgery based on his results of his infection in his thumb.   Plain film of wrist (12-24-12): 1. 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  2. Significant collapse involving the first metacarpal and  trapezium. Suspect involvement of the distal scaphoid.  ESR 27 (12-24-12) CRP 15.3 (12-24-12)   Review of Systems No f/c, normal function in his fingers. His thumb is dislocated and he has limited function. Wound is healed on his thumb. No swelling or erythema. See hpi.      Objective:   Physical Exam  Constitutional: He appears well-developed and well-nourished. No distress.  Musculoskeletal:       Hands:         Assessment & Plan:

## 2013-01-28 NOTE — Assessment & Plan Note (Signed)
Encouraged to abstain while on anbx. His previous LFTS were normal.

## 2013-01-28 NOTE — Assessment & Plan Note (Signed)
Will send him for MRI of his hand.  Will repeat his ESR and CRP (I suspect that these were elevated due to his intercurrent illness at his previous visit).  Will have him return next month to ID if not sooner.  If his MRI is showing worsening infection, he may need to be transitioned to IV anbx. May need debridement?

## 2013-01-28 NOTE — Addendum Note (Signed)
Addended by: Starleen Arms D on: 01/28/2013 10:21 AM   Modules accepted: Orders

## 2013-01-30 ENCOUNTER — Telehealth: Payer: Self-pay | Admitting: *Deleted

## 2013-01-30 NOTE — Telephone Encounter (Signed)
Faxed order after Dr. Ninetta Lights signed to [facility], Attn: Harriett Sine.  Faxed copy of order to [employer] for billing purposes to [phone].

## 2013-02-04 ENCOUNTER — Encounter: Payer: Self-pay | Admitting: Licensed Clinical Social Worker

## 2013-02-11 ENCOUNTER — Telehealth: Payer: Self-pay | Admitting: Licensed Clinical Social Worker

## 2013-02-11 NOTE — Telephone Encounter (Signed)
I called the patient's workers comp Sports coach asking her to return my call. We have not received a copy of the MRI report and she is requesting a return visit for the patient. Per his last visit if his MRI is normal he does not need to return in 5 months. I called [facility] and asked for a copy but they stated that they don't keep them when it's worker's comp.

## 2013-02-25 ENCOUNTER — Ambulatory Visit (INDEPENDENT_AMBULATORY_CARE_PROVIDER_SITE_OTHER): Payer: Worker's Compensation | Admitting: Internal Medicine

## 2013-02-25 ENCOUNTER — Encounter: Payer: Self-pay | Admitting: Internal Medicine

## 2013-02-25 VITALS — BP 130/77 | HR 102 | Temp 99.6°F

## 2013-02-25 DIAGNOSIS — M869 Osteomyelitis, unspecified: Secondary | ICD-10-CM

## 2013-02-25 LAB — CBC WITH DIFFERENTIAL/PLATELET
Basophils Absolute: 0 10*3/uL (ref 0.0–0.1)
Eosinophils Absolute: 0.1 10*3/uL (ref 0.0–0.7)
Eosinophils Relative: 2 % (ref 0–5)
MCH: 30.5 pg (ref 26.0–34.0)
MCV: 89.2 fL (ref 78.0–100.0)
Platelets: 320 10*3/uL (ref 150–400)
RDW: 13.7 % (ref 11.5–15.5)
WBC: 6.5 10*3/uL (ref 4.0–10.5)

## 2013-02-25 NOTE — Progress Notes (Signed)
[facility] Note  RFV: follow up to osteo of hand, worker's comp, requiring surgery Subjective:    Patient ID: Jacob Reilly, male    DOB: [birth year], 47 y.o.   MRN: [id number]  HPI 47 year old with hx of acute osteomyelitis of his right thumb. He initially had a broken thumb in mid-December 2013 that was surgically repaired by Dr. Mina Marble at that time and had two pins placed. One of the pins came out inadvertently and he then developed swelling and redness. Underwent 2nd surgery, an I x D, in January, and felt to be c/w osteomyelitis. OR Cx grew CA-MRSA, (sensitive to Bactrim, R to tetracycline) and he was discharged on bactrim . Since the surgery, his wound has healed and edema and erythema has resolved. Dr. Luciana Axe increased his dosage to  2 bactrim DS bid and rifampin 300mg  bid in February. He did well until early March when developed fever, myalgias, n/v. He was seen in ID clinic(noted to have ETOH use), stopped his rifampin and decreased his bactrim dose. Work up from March visit showed ESR 27 and CRP 15, with plain wrist xray  Changes consistent with chronic osteomyelitis involving the radial aspect of the carpus and the first metacarpal. 2. Significant collapse involving the first metacarpal and trapezium. Suspect involvement of the distal scaphoid.  He was asked to go back to bactrim Ds BID and rif 300 BID and followed up in April. In April, he was back to full health, still having difficulty with use of right thumb. Lab work showed normal inflammatory markers ( Sed rate 4 adn CRP <0.5). An MRI of hand done in order to look for worsening disease or deep tissue infection to determine if need IV antibiotics or further debridement. He was continued on antibiotics until his visit today  He is doing well with his constitutional health. Is anxious to get his hand operated on in order to get better function and return to work. He does not have FROM of right thumb still swelling at the base of  thumb. No pain. Hardly uses any percocets. Continues to take abtx as directed with bactrim DS BID and rifampin 300 BID. Urine intermittently dark orange as one would expect with rif use.   MRI on 02/07/13 showed chronic deformity of the base of the first metacarpal and of the adjacent trapezium as demonstrated on prior xray. There is edema in the trapezium and in the base and proximal shaft fo the first metacarpal. There is also high signal material within the joint space on T2 images. After contrast admin, the trapezium and the base o f the first metacarpal and the material in the joint space all enhance. There are no fluid collections. No abscess in the deep tissues. The adjacent muscle structures appear normal.   Impression: abn edema and enhancement at the first carpal metacarpal joint with echancement of what is probably synovial hypertrophy or fibrosis with then joint. The deformity of the bones has not changedsince the prior xray in march.   weingold will likely be doing surgery of right hand once infection resolved per patient report Current Outpatient Prescriptions on File Prior to Visit  Medication Sig Dispense Refill  . albuterol (PROVENTIL HFA;VENTOLIN HFA) 108 (90 BASE) MCG/ACT inhaler Inhale 2 puffs into the lungs every 6 (six) hours as needed. Asthma      . Multiple Vitamin (MULTIVITAMIN WITH MINERALS) TABS Take 1 tablet by mouth daily.      Marland Kitchen oxyCODONE-acetaminophen (PERCOCET) 5-325 MG per  tablet 1-2 tabs po q6 hours prn pain  40 tablet  0  . rifampin (RIFADIN) 300 MG capsule Take 1 capsule (300 mg total) by mouth 2 (two) times daily.  60 capsule  3  . sulfamethoxazole-trimethoprim (BACTRIM DS) 800-160 MG per tablet Take 2 tablets by mouth 2 (two) times daily.  120 tablet  3   No current facility-administered medications on file prior to visit.   Active Ambulatory Problems    Diagnosis Date Noted  . Finger osteomyelitis, right 11/26/2012  . Fever, unspecified 12/24/2012  . MRSA  infection 12/24/2012  . Dizziness and giddiness 12/24/2012  . Smoker 12/24/2012  . Habitual alcohol use 12/24/2012   Resolved Ambulatory Problems    Diagnosis Date Noted  . No Resolved Ambulatory Problems   No Additional Past Medical History   Social and family hx unchanged since last visit; remains unemployed during this period of his hand injury   Review of Systems  Constitutional: Negative for fever, chills, diaphoresis, activity change, appetite change, fatigue and unexpected weight change.  HENT: Negative for congestion, sore throat, rhinorrhea, sneezing, trouble swallowing and sinus pressure.  Eyes: Negative for photophobia and visual disturbance.  Respiratory: Negative for cough, chest tightness, shortness of breath, wheezing and stridor.  Cardiovascular: Negative for chest pain, palpitations and leg swelling.  Gastrointestinal: Negative for nausea, vomiting, abdominal pain, diarrhea, constipation, blood in stool, abdominal distention and anal bleeding.  Genitourinary: Negative for dysuria, hematuria, flank pain and difficulty urinating.  Musculoskeletal: per hpi Skin: Negative for color change, pallor, rash and wound.  Neurological: Negative for dizziness, tremors, weakness and light-headedness.  Hematological: Negative for adenopathy. Does not bruise/bleed easily.  Psychiatric/Behavioral: Negative for behavioral problems, confusion, sleep disturbance, dysphoric mood, decreased concentration and agitation.       Objective:   Physical Exam BP 130/77  Pulse 102  Temp(Src) 99.6 F (37.6 C) (Oral) gen = a x o by 4 in NAD Neuro = decreased ROM on flexion of right thumb Skin = right dorsum of hand has hyperpigmented scarring for hand surgery, assymetric due to swelling of right metatarsal  Labs: Lab Results  Component Value Date   ESRSEDRATE 4 01/28/2013   Lab Results  Component Value Date   CRP <0.5 01/28/2013        Assessment & Plan:  Osteomyelitis of right  hand = i have reviewed the MRI results and his previous labs which are consistent with treated osteomyelitis. I will check sed rate, crp, CMP today to ensure that they are still normal. He has been on appropriate antibiotic therapy >8 wks. Would suspect that his infection has cleared and would be suitable for next step of his surgery  - continue with rif and bactrim until he sees Dr. Mina Marble on May 27th - will call him with results on 5/13 and send copy of mri read to patient

## 2013-02-27 ENCOUNTER — Encounter: Payer: Self-pay | Admitting: Infectious Diseases

## 2013-03-20 ENCOUNTER — Other Ambulatory Visit: Payer: Self-pay | Admitting: Licensed Clinical Social Worker

## 2013-03-20 DIAGNOSIS — A4902 Methicillin resistant Staphylococcus aureus infection, unspecified site: Secondary | ICD-10-CM

## 2013-03-20 DIAGNOSIS — M869 Osteomyelitis, unspecified: Secondary | ICD-10-CM

## 2013-03-20 MED ORDER — RIFAMPIN 300 MG PO CAPS: 300.0000 mg | ORAL_CAPSULE | Freq: Two times a day (BID) | ORAL | Status: DC

## 2013-05-02 ENCOUNTER — Ambulatory Visit: Payer: Self-pay | Admitting: Internal Medicine

## 2013-07-19 ENCOUNTER — Encounter (HOSPITAL_COMMUNITY): Payer: Self-pay | Admitting: Pharmacy Technician

## 2013-07-26 ENCOUNTER — Encounter (HOSPITAL_COMMUNITY): Payer: Self-pay

## 2013-07-26 ENCOUNTER — Encounter (HOSPITAL_COMMUNITY)
Admission: RE | Admit: 2013-07-26 | Discharge: 2013-07-26 | Disposition: A | Discharge status: Home / Self Care | Payer: Self-pay | Source: Ambulatory Visit | Attending: Urology | Admitting: Urology

## 2013-07-26 DIAGNOSIS — Z01812 Encounter for preprocedural laboratory examination: Secondary | ICD-10-CM | POA: Insufficient documentation

## 2013-07-26 HISTORY — DX: Unspecified asthma, uncomplicated: J45.909

## 2013-07-26 LAB — BASIC METABOLIC PANEL
BUN: 15 mg/dL (ref 6–23)
CO2: 24 mEq/L (ref 19–32)
Chloride: 98 mEq/L (ref 96–112)
Creatinine, Ser: 0.92 mg/dL (ref 0.50–1.35)

## 2013-07-26 NOTE — Patient Instructions (Signed)
Jacob Reilly  07/26/2013   Your procedure is scheduled on:  08/01/2013  Report to [facility] at  615  AM.  Call this number if you have problems the morning of surgery: [phone]   Remember:   Do not eat food or drink liquids after midnight.   Take these medicines the morning of surgery with A SIP OF WATER: Take your proventil inhaler before you come.   Do not wear jewelry, make-up or nail polish.  Do not wear lotions, powders, or perfumes.   Do not shave 48 hours prior to surgery. Men may shave face and neck.  Do not bring valuables to the hospital.  [facility] is not responsible for any belongings or valuables.               Contacts, dentures or bridgework may not be worn into surgery.  Leave suitcase in the car. After surgery it may be brought to your room.  For patients admitted to the hospital, discharge time is determined by your treatment team.               Patients discharged the day of surgery will not be allowed to drive home.  Name and phone number of your driver: family  Special Instructions: Shower using CHG 2 nights before surgery and the night before surgery.  If you shower the day of surgery use CHG.  Use special wash - you have one bottle of CHG for all showers.  You should use approximately 1/3 of the bottle for each shower.   Please read over the following fact sheets that you were given: Pain Booklet, Coughing and Deep Breathing, MRSA Information, Surgical Site Infection Prevention, Anesthesia Post-op Instructions and Care and Recovery After Surgery Hydrocele, Adult Fluid can collect around the testicles. This fluid forms in a sac. This condition is called a hydrocele. The collected fluid causes swelling of the scrotum. Usually, it affects just one testicle. Most of the time, the condition does not cause pain. Sometimes, the hydrocele goes away on its own. Other times, surgery is needed to get rid of the fluid. CAUSES A hydrocele does not develop  often. Different things can cause a hydrocele in a man, including:  Injury to the scrotum.  Infection.  X-ray of the area around the scrotum.  A tumor or cancer of the testicle.  Twisting of a testicle.  Decreased blood flow to the scrotum. SYMPTOMS   Swelling without pain. The hydrocele feels like a water-filled balloon.  Swelling with pain. This can occur if the hydrocele was caused by infection or twisting.  Mild discomfort in the scrotum.  The hydrocele may feel heavy.  Swelling that gets smaller when you lie down. DIAGNOSIS  Your caregiver will do a physical exam to decide if you have a hydrocele. This may include:  Asking questions about your overall health, today and in the past. Your caregiver may ask about any injuries, X-rays, or infections.  Pushing on your abdomen or asking you to change positions to see if the size of the hydrocele changes.  Shining a light through the scrotum (transillumination) to see if the fluid inside the scrotum is clear.  Blood tests and urine tests to check for infection.  Imaging studies that take pictures of the scrotum and testicles. TREATMENT  Treatment depends in part on what caused the condition. Options include:  Watchful waiting. Your caregiver checks the hydrocele every so often.  Different surgeries to drain the  fluid.  A needle may be put into the scrotum to drain fluid (needle aspiration). Fluid often returns after this type of treatment.  A cut (incision) may be made in the scrotum to remove the fluid sac (hydrocelectomy).  An incision may be made in the groin to repair a hydrocele that has contact with abdominal fluids (communicating hydrocele).  Medicines to treat an infection (antibiotics). HOME CARE INSTRUCTIONS  What you need to do at home may depend on the cause of the hydrocele and type of treatment. In general:  Take all medicine as directed by your caregiver. Follow the directions carefully.  Ask your  caregiver if there is anything you should not do while you recover (activities, lifting, work, sex).  If you had surgery to repair a communicating hydrocele, recovery time may vary. Ask you caregiver about your recovery time.  Avoid heavy lifting for 4 to 6 weeks.  If you had an incision on the scrotum or groin, wash it for 2 to 3 days after surgery. Do this as long as the skin is closed and there are no gaps in the wound. Wash gently, and avoid rubbing the incision.  Keep all follow-up appointments. SEEK MEDICAL CARE IF:   Your scrotum seems to be getting larger.  The area becomes more and more uncomfortable. SEEK IMMEDIATE MEDICAL CARE IF:  You have a fever. Document Released: 03/23/2010 Document Revised: 12/26/2011 Document Reviewed: 03/23/2010 [facility] Patient Information 2014 [place], [place]. PATIENT INSTRUCTIONS POST-ANESTHESIA  IMMEDIATELY FOLLOWING SURGERY:  Do not drive or operate machinery for the first twenty four hours after surgery.  Do not make any important decisions for twenty four hours after surgery or while taking narcotic pain medications or sedatives.  If you develop intractable nausea and vomiting or a severe headache please notify your doctor immediately.  FOLLOW-UP:  Please make an appointment with your surgeon as instructed. You do not need to follow up with anesthesia unless specifically instructed to do so.  WOUND CARE INSTRUCTIONS (if applicable):  Keep a dry clean dressing on the anesthesia/puncture wound site if there is drainage.  Once the wound has quit draining you may leave it open to air.  Generally you should leave the bandage intact for twenty four hours unless there is drainage.  If the epidural site drains for more than 36-48 hours please call the anesthesia department.  QUESTIONS?:  Please feel free to call your physician or the hospital operator if you have any questions, and they will be happy to assist you.

## 2013-08-01 ENCOUNTER — Encounter (HOSPITAL_COMMUNITY)
Admission: RE | Disposition: A | Discharge status: Home / Self Care | Payer: Self-pay | Source: Ambulatory Visit | Attending: Urology

## 2013-08-01 ENCOUNTER — Encounter (HOSPITAL_COMMUNITY): Payer: Self-pay | Admitting: *Deleted

## 2013-08-01 ENCOUNTER — Ambulatory Visit (HOSPITAL_COMMUNITY): Payer: Self-pay | Admitting: Anesthesiology

## 2013-08-01 ENCOUNTER — Encounter (HOSPITAL_COMMUNITY): Payer: Self-pay | Admitting: Anesthesiology

## 2013-08-01 ENCOUNTER — Ambulatory Visit (HOSPITAL_COMMUNITY)
Admission: RE | Admit: 2013-08-01 | Discharge: 2013-08-01 | Disposition: A | Discharge status: Home / Self Care | Payer: Self-pay | Source: Ambulatory Visit | Attending: Urology | Admitting: Urology

## 2013-08-01 DIAGNOSIS — N433 Hydrocele, unspecified: Secondary | ICD-10-CM | POA: Insufficient documentation

## 2013-08-01 HISTORY — PX: HYDROCELE EXCISION: SHX482

## 2013-08-01 SURGERY — HYDROCELECTOMY
Anesthesia: General | Site: Scrotum | Laterality: Left | Wound class: Clean Contaminated

## 2013-08-01 MED ORDER — FENTANYL CITRATE 0.05 MG/ML IJ SOLN
25.0000 ug | INTRAMUSCULAR | Status: AC
  Administered 2013-08-01 (×2): 25 ug via INTRAVENOUS

## 2013-08-01 MED ORDER — BUPIVACAINE HCL (PF) 0.25 % IJ SOLN
INTRAMUSCULAR | Status: DC | PRN
  Administered 2013-08-01: 10 mL

## 2013-08-01 MED ORDER — SEVOFLURANE IN SOLN
RESPIRATORY_TRACT | Status: AC
  Filled 2013-08-01: qty 250

## 2013-08-01 MED ORDER — FENTANYL CITRATE 0.05 MG/ML IJ SOLN
INTRAMUSCULAR | Status: AC
  Filled 2013-08-01: qty 2

## 2013-08-01 MED ORDER — BACITRACIN ZINC 500 UNIT/GM EX OINT
TOPICAL_OINTMENT | CUTANEOUS | Status: AC
  Filled 2013-08-01: qty 0.9

## 2013-08-01 MED ORDER — FENTANYL CITRATE 0.05 MG/ML IJ SOLN
25.0000 ug | INTRAMUSCULAR | Status: DC | PRN
  Administered 2013-08-01: 50 ug via INTRAVENOUS
  Filled 2013-08-01: qty 2

## 2013-08-01 MED ORDER — ONDANSETRON HCL 4 MG/2ML IJ SOLN
4.0000 mg | Freq: Once | INTRAMUSCULAR | Status: AC
  Administered 2013-08-01: 4 mg via INTRAVENOUS
  Filled 2013-08-01: qty 2

## 2013-08-01 MED ORDER — LACTATED RINGERS IV SOLN
INTRAVENOUS | Status: DC | PRN
  Administered 2013-08-01 (×2): via INTRAVENOUS

## 2013-08-01 MED ORDER — PROPOFOL 10 MG/ML IV BOLUS
INTRAVENOUS | Status: DC | PRN
  Administered 2013-08-01: 150 mg via INTRAVENOUS

## 2013-08-01 MED ORDER — BACITRACIN ZINC 500 UNIT/GM EX OINT
TOPICAL_OINTMENT | CUTANEOUS | Status: DC | PRN
  Administered 2013-08-01: 1 via TOPICAL

## 2013-08-01 MED ORDER — LIDOCAINE HCL (CARDIAC) 20 MG/ML IV SOLN
INTRAVENOUS | Status: DC | PRN
  Administered 2013-08-01: 50 mg via INTRAVENOUS

## 2013-08-01 MED ORDER — LACTATED RINGERS IV SOLN
INTRAVENOUS | Status: DC
  Administered 2013-08-01: 08:00:00 via INTRAVENOUS

## 2013-08-01 MED ORDER — PROPOFOL 10 MG/ML IV BOLUS
INTRAVENOUS | Status: AC
  Filled 2013-08-01: qty 20

## 2013-08-01 MED ORDER — 0.9 % SODIUM CHLORIDE (POUR BTL) OPTIME
TOPICAL | Status: DC | PRN
  Administered 2013-08-01: 1000 mL

## 2013-08-01 MED ORDER — BUPIVACAINE HCL (PF) 0.25 % IJ SOLN
INTRAMUSCULAR | Status: AC
  Filled 2013-08-01: qty 30

## 2013-08-01 MED ORDER — MIDAZOLAM HCL 2 MG/2ML IJ SOLN
INTRAMUSCULAR | Status: AC
  Filled 2013-08-01: qty 2

## 2013-08-01 MED ORDER — OXYCODONE-ACETAMINOPHEN 7.5-325 MG PO TABS: 1.0000 | ORAL_TABLET | Freq: Four times a day (QID) | ORAL | Status: DC | PRN

## 2013-08-01 MED ORDER — FENTANYL CITRATE 0.05 MG/ML IJ SOLN
INTRAMUSCULAR | Status: DC | PRN
  Administered 2013-08-01 (×2): 50 ug via INTRAVENOUS
  Administered 2013-08-01: 25 ug via INTRAVENOUS
  Administered 2013-08-01 (×3): 50 ug via INTRAVENOUS
  Administered 2013-08-01: 25 ug via INTRAVENOUS

## 2013-08-01 MED ORDER — ONDANSETRON HCL 4 MG/2ML IJ SOLN: 4.0000 mg | Freq: Once | INTRAMUSCULAR | Status: DC | PRN

## 2013-08-01 MED ORDER — SUCCINYLCHOLINE CHLORIDE 20 MG/ML IJ SOLN
INTRAMUSCULAR | Status: AC
  Filled 2013-08-01: qty 1

## 2013-08-01 MED ORDER — ARTIFICIAL TEARS OP OINT
TOPICAL_OINTMENT | OPHTHALMIC | Status: AC
  Filled 2013-08-01: qty 3.5

## 2013-08-01 MED ORDER — MIDAZOLAM HCL 2 MG/2ML IJ SOLN
1.0000 mg | INTRAMUSCULAR | Status: AC | PRN
  Administered 2013-08-01 (×3): 2 mg via INTRAVENOUS
  Filled 2013-08-01 (×2): qty 2

## 2013-08-01 MED ORDER — LIDOCAINE HCL (PF) 1 % IJ SOLN
INTRAMUSCULAR | Status: AC
  Filled 2013-08-01: qty 5

## 2013-08-01 SURGICAL SUPPLY — 34 items
CLOTH BEACON ORANGE TIMEOUT ST (SAFETY) ×2 IMPLANT
COVER LIGHT HANDLE STERIS (MISCELLANEOUS) ×4 IMPLANT
DECANTER SPIKE VIAL GLASS SM (MISCELLANEOUS) ×2 IMPLANT
DRSG TEGADERM 2-3/8X2-3/4 SM (GAUZE/BANDAGES/DRESSINGS) ×2 IMPLANT
ELECT REM PT RETURN 9FT ADLT (ELECTROSURGICAL) ×2
ELECTRODE REM PT RTRN 9FT ADLT (ELECTROSURGICAL) ×1 IMPLANT
FORMALIN 10 PREFIL 20ML (MISCELLANEOUS) ×2 IMPLANT
GLOVE BIO SURGEON STRL SZ7 (GLOVE) ×2 IMPLANT
GLOVE BIOGEL PI IND STRL 7.0 (GLOVE) ×1 IMPLANT
GLOVE BIOGEL PI IND STRL 7.5 (GLOVE) ×2 IMPLANT
GLOVE BIOGEL PI INDICATOR 7.0 (GLOVE) ×1
GLOVE BIOGEL PI INDICATOR 7.5 (GLOVE) ×2
GLOVE ECLIPSE 6.5 STRL STRAW (GLOVE) ×2 IMPLANT
GLOVE ECLIPSE 7.0 STRL STRAW (GLOVE) ×2 IMPLANT
GLOVE EXAM NITRILE LRG STRL (GLOVE) ×2 IMPLANT
GOWN STRL REIN XL XLG (GOWN DISPOSABLE) ×8 IMPLANT
KIT BLADEGUARD II DBL (SET/KITS/TRAYS/PACK) ×2 IMPLANT
KIT ROOM TURNOVER AP CYSTO (KITS) ×2 IMPLANT
MANIFOLD NEPTUNE II (INSTRUMENTS) ×2 IMPLANT
NEEDLE HYPO 21X1.5 SAFETY (NEEDLE) ×2 IMPLANT
NS IRRIG 1000ML POUR BTL (IV SOLUTION) ×2 IMPLANT
PACK MINOR (CUSTOM PROCEDURE TRAY) ×2 IMPLANT
PAD ARMBOARD 7.5X6 YLW CONV (MISCELLANEOUS) ×2 IMPLANT
SET BASIN LINEN APH (SET/KITS/TRAYS/PACK) ×2 IMPLANT
SOL PREP PROV IODINE SCRUB 4OZ (MISCELLANEOUS) ×2 IMPLANT
SPONGE GAUZE 4X4 12PLY (GAUZE/BANDAGES/DRESSINGS) ×2 IMPLANT
SPONGE INTESTINAL PEANUT (DISPOSABLE) ×2 IMPLANT
SPONGE LAP 18X18 X RAY DECT (DISPOSABLE) ×2 IMPLANT
SUPPORT SCROTAL XLRG NO STRP (MISCELLANEOUS) ×2 IMPLANT
SUT CHROMIC 3 0 SH 27 (SUTURE) ×2 IMPLANT
SUT VICRYL AB 3-0 BRD CT 36IN (SUTURE) ×10 IMPLANT
SYR CONTROL 10ML LL (SYRINGE) ×2 IMPLANT
SYRINGE 10CC LL (SYRINGE) ×2 IMPLANT
YANKAUER SUCT 12FT TUBE ARGYLE (SUCTIONS) ×2 IMPLANT

## 2013-08-01 NOTE — Anesthesia Preprocedure Evaluation (Addendum)
Anesthesia Evaluation  Patient identified by MRN, date of birth, ID band Patient awake    Reviewed: Allergy & Precautions, H&P , NPO status , Patient's Chart, lab work & pertinent test results  Airway Mallampati: I TM Distance: >3 FB Neck ROM: Full    Dental no notable dental hx. (+) Teeth Intact and Dental Advisory Given   Pulmonary neg pulmonary ROS, asthma ,  breath sounds clear to auscultation  Pulmonary exam normal       Cardiovascular negative cardio ROS  Rhythm:Regular Rate:Normal     Neuro/Psych negative neurological ROS  negative psych ROS   GI/Hepatic negative GI ROS, Neg liver ROS,   Endo/Other  negative endocrine ROS  Renal/GU negative Renal ROS  negative genitourinary   Musculoskeletal   Abdominal   Peds  Hematology negative hematology ROS (+)   Anesthesia Other Findings   Reproductive/Obstetrics negative OB ROS                          Anesthesia Physical Anesthesia Plan  ASA: I and emergent  Anesthesia Plan: General   Post-op Pain Management:    Induction: Intravenous  Airway Management Planned: LMA  Additional Equipment:   Intra-op Plan:   Post-operative Plan: Extubation in OR  Informed Consent: I have reviewed the patients History and Physical, chart, labs and discussed the procedure including the risks, benefits and alternatives for the proposed anesthesia with the patient or authorized representative who has indicated his/her understanding and acceptance.   Dental advisory given  Plan Discussed with: CRNA  Anesthesia Plan Comments:         Anesthesia Quick Evaluation

## 2013-08-01 NOTE — Preoperative (Signed)
Beta Blockers   Reason not to administer Beta Blockers:Not Applicable 

## 2013-08-01 NOTE — Anesthesia Procedure Notes (Signed)
Procedure Name: LMA Insertion Date/Time: 08/01/2013 8:35 AM Performed by: Carolyne Littles, AMY L Pre-anesthesia Checklist: Patient identified, Timeout performed, Emergency Drugs available, Suction available and Patient being monitored Patient Re-evaluated:Patient Re-evaluated prior to inductionOxygen Delivery Method: Circle system utilized Preoxygenation: Pre-oxygenation with 100% oxygen Intubation Type: IV induction Ventilation: Mask ventilation without difficulty LMA Size: 4.0 Number of attempts: 1 Tube secured with: Tape Dental Injury: Teeth and Oropharynx as per pre-operative assessment

## 2013-08-01 NOTE — Brief Op Note (Signed)
08/01/2013  9:49 AM  PATIENT:  Jacob Reilly  47 y.o. male  PRE-OPERATIVE DIAGNOSIS:  left hydrocele  POST-OPERATIVE DIAGNOSIS:  left hydrocele  PROCEDURE:  Procedure(s): HYDROCELECTOMY ADULT (Left)  SURGEON:  Surgeon(s) and Role:    * Ky Barban, MD - Primary  PHYSICIAN ASSISTANT:   ASSISTANTS: none   ANESTHESIA:   spinal  EBL:  Total I/O In: 1300 [I.V.:1300] Out: 5 [Blood:5]  BLOOD ADMINISTERED:none  DRAINS: none   LOCAL MEDICATIONS USED:  MARCAINE     SPECIMEN:  Source of Specimen:  appendix testicle  DISPOSITION OF SPECIMEN:  PATHOLOGY  COUNTS:  YES  TOURNIQUET:  * No tourniquets in log *  DICTATION: .Other Dictation: Dictation Number [id number]  PLAN OF CARE: Discharge to home after PACU  PATIENT DISPOSITION:  PACU - hemodynamically stable.   Delay start of Pharmacological VTE agent (>24hrs) due to surgical blood loss or risk of bleeding:

## 2013-08-01 NOTE — H&P (Signed)
Jacob Reilly, Jacob Reilly               ACCOUNT NO.:  [id number]  MEDICAL RECORD NO.:  [id number]  LOCATION:                                 FACILITY:  PHYSICIAN:  Ky Barban, M.D.DATE OF BIRTH:  May 23, 1966  DATE OF ADMISSION:  08/01/2013 DATE OF DISCHARGE:  LH                             HISTORY & PHYSICAL   CHIEF COMPLAINT:  Left testicular swelling.  HISTORY:  A 47 year old gentleman says that he is having this swelling for long time, but recently, it has gotten three times bigger and it is uncomfortable to walk around.  It is bothering him.  He wants something done about it.  He has no trouble using the bathroom.  PAST MEDICAL HISTORY:  No history of diabetes or hypertension.  Has history of having kidney stones, which he passed about 30 years ago.  FAMILY HISTORY:  No history of prostate cancer.  PERSONAL HISTORY:  Smokes 1 pack of cigarettes per day for 30 years. Drinks 6 pegs daily.  REVIEW OF SYSTEMS:  Unremarkable.  PHYSICAL EXAMINATION:  GENERAL:  Moderately built male, not in acute distress, fully conscious, alert, oriented. VITAL SIGNS:  Blood pressure is 130/80, temperature is normal. CENTRAL NERVOUS SYSTEM:  No gross neurological deficit. HEAD, NECK, EYE, ENT:  Negative. CHEST:  Symmetrical.  Normal breath sounds. HEART:  Regular sinus rhythm.  No murmur. ABDOMEN:  Soft, flat.  Liver, spleen, and kidneys are not palpable.  No CVA tenderness. GU:  External genitalia has large swelling of the left hemiscrotum. Testicle feels normal.  The swelling of the left hemiscrotum goes all the way into the groin, but transilluminates freely.  I did get a testicular ultrasound to make sure the testicle is okay and the testicular ultrasound confirmed that he had a large hydrocele and both testicles are normal.  IMPRESSION:  Left hydrocele.  PLAN:  Left hydrocelectomy under anesthesia as outpatient.  I have discussed the patient with the possible complications  especially impaction and the swelling, and no guarantees what the result of the understand, wanting me to go ahead and proceed.     Ky Barban, M.D.     MIJ/MEDQ  D:  07/31/2013  T:  08/01/2013  Job:  [id number]

## 2013-08-01 NOTE — Anesthesia Postprocedure Evaluation (Signed)
  Anesthesia Post-op Note  Patient: Jacob Reilly  Procedure(s) Performed: Procedure(s): HYDROCELECTOMY ADULT (Left)  Patient Location: PACU  Anesthesia Type:General  Level of Consciousness: awake, alert , oriented and patient cooperative  Airway and Oxygen Therapy: Patient Spontanous Breathing  Post-op Pain: moderate  Post-op Assessment: Post-op Vital signs reviewed  Post-op Vital Signs: Reviewed and stable  Complications: No apparent anesthesia complications

## 2013-08-01 NOTE — Transfer of Care (Signed)
Immediate Anesthesia Transfer of Care Note  Patient: Jacob Reilly  Procedure(s) Performed: Procedure(s): HYDROCELECTOMY ADULT (Left)  Patient Location: PACU  Anesthesia Type:General  Level of Consciousness: awake, alert , oriented and patient cooperative  Airway & Oxygen Therapy: Patient Spontanous Breathing  Post-op Assessment: Report given to PACU RN and Post -op Vital signs reviewed and stable  Post vital signs: Reviewed and stable  Complications: No apparent anesthesia complications

## 2013-08-01 NOTE — Op Note (Signed)
NAME:  Jacob Reilly, Jacob Reilly               ACCOUNT NO.:  [id number]  MEDICAL RECORD NO.:  [id number]  LOCATION:  [facility]                          FACILITY:  [facility]  PHYSICIAN:  Ky Barban, M.D.DATE OF BIRTH:  1966/10/15  DATE OF PROCEDURE: DATE OF DISCHARGE:                              OPERATIVE REPORT   PREOPERATIVE DIAGNOSIS:  Large left hydrocele.  POSTOPERATIVE DIAGNOSIS:  Large left hydrocele.  PROCEDURE:  Left hydrocelectomy.  ANESTHESIA:  Spinal.  DESCRIPTION OF PROCEDURE:  The patient under spinal anesthesia in supine position.  After usual prep and drape, the left hydrocele was held by the assistant and then I aspirated with #20 needle and 10 mL syringe of clear amber colored fluid came out.  So, I proceeded to do the hydrocelectomy.  Transverse incision across mid scrotum was made while the assistant was holding the scrotum tight on that side.  The fascial layers were divided with the help of the cautery.  The hydrocele sac was exposed.  It was stabbed, drained about 100 mL of clear fluid.  After enlarging the hydrocele opening with the help of the cautery, the testicle was delivered through it and everted the sac and felt there is no other pathology in the testicle.  The appendix testicle was excised with the help of the cautery and the margin of this hydrocele sac was continuously imbricated along side of the left testicle with a running stitch of 3-0 Vicryl on both sides and after imbricating the hydrocele sac, there was no bleeding going on.  The testicle was replaced into the scrotal sac and the fascial layers were closed with continuous stitch of 3-0 Vicryl stitch.  Skin was closed with horizontal mattress 3-0 chromic.  At the end, there was no bleeding.  The wound was infiltrated with 10 mL of 0.25% Marcaine.  Sterile gauze dressing applied.  The patient left the operating room in satisfactory condition.     Ky Barban, M.D.     MIJ/MEDQ  D:   08/01/2013  T:  08/01/2013  Job:  [id number]

## 2013-08-01 NOTE — Progress Notes (Signed)
No change in H&P on reexamination. 

## 2013-08-02 ENCOUNTER — Encounter (HOSPITAL_COMMUNITY): Payer: Self-pay | Admitting: Urology

## 2013-08-26 ENCOUNTER — Ambulatory Visit: Payer: Self-pay | Admitting: Internal Medicine

## 2013-08-27 ENCOUNTER — Ambulatory Visit (INDEPENDENT_AMBULATORY_CARE_PROVIDER_SITE_OTHER): Payer: Worker's Compensation | Admitting: Internal Medicine

## 2013-08-27 ENCOUNTER — Encounter: Payer: Self-pay | Admitting: Internal Medicine

## 2013-08-27 VITALS — BP 117/79 | HR 68 | Temp 98.1°F | Ht 67.0 in | Wt 175.0 lb

## 2013-08-27 DIAGNOSIS — M869 Osteomyelitis, unspecified: Secondary | ICD-10-CM

## 2013-08-27 NOTE — Assessment & Plan Note (Signed)
This has healed from an ID standpoint and no further antibiotics indicated.  RTC as needed.

## 2013-08-27 NOTE — Progress Notes (Signed)
  Subjective:    Patient ID: Jacob Reilly, male    DOB: [birth year], 47 y.o.   MRN: [id number]  HPI 47 year old with hx of acute osteomyelitis of his right thumb. He initially had a broken thumb in mid-December 2013 that was surgically repaired by Dr. Mina Marble at that time and had two pins placed. One of the pins came out inadvertently and he then developed swelling and redness. Underwent 2nd surgery, an I x D, in January, and felt to be c/w osteomyelitis. OR Cx grew CA-MRSA, (sensitive to Bactrim, R to tetracycline) and he was discharged on bactrim . Since the surgery, his wound has healed and edema and erythema has resolved. Dr. Luciana Axe increased his dosage to 2 bactrim DS bid and rifampin 300mg  bid in February. He did well until early March when developed fever, myalgias, n/v. He was seen in ID clinic(noted to have ETOH use), stopped his rifampin and decreased his bactrim dose. 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 2. Significant collapse involving the first metacarpal and trapezium. Suspect involvement of the distal scaphoid. He was asked to go back to bactrim Ds BID and rif 300 BID and followed up in April. In April, he was back to full health, still having difficulty with use of right thumb. Lab work showed normal inflammatory markers ( Sed rate 4 adn CRP <0.5). He finished about 6 months of po medication.    MRI on 02/07/13 showed chronic deformity of the base of the first metacarpal and of the adjacent trapezium as demonstrated on prior xray.   He had surgery in June of finger and has had no further swelling, no fever, no chills.  Still with decreased ROM, some tenderness at certain parts.      Review of Systems  Constitutional: Negative for fever.  Musculoskeletal: Negative for joint swelling.  Skin: Negative for rash.  Neurological: Negative for  dizziness.       Objective:   Physical Exam  Constitutional: He appears well-developed and well-nourished. No distress.  Musculoskeletal: He exhibits no edema.  Decreased flexion of thumb  Skin: No rash noted.  Psychiatric: He has a normal mood and affect.          Assessment & Plan:

## 2014-04-10 ENCOUNTER — Encounter (HOSPITAL_COMMUNITY): Payer: Self-pay | Admitting: Emergency Medicine

## 2014-04-10 DIAGNOSIS — Z791 Long term (current) use of non-steroidal anti-inflammatories (NSAID): Secondary | ICD-10-CM | POA: Insufficient documentation

## 2014-04-10 DIAGNOSIS — F1410 Cocaine abuse, uncomplicated: Secondary | ICD-10-CM | POA: Insufficient documentation

## 2014-04-10 DIAGNOSIS — J45909 Unspecified asthma, uncomplicated: Secondary | ICD-10-CM | POA: Insufficient documentation

## 2014-04-10 DIAGNOSIS — F17200 Nicotine dependence, unspecified, uncomplicated: Secondary | ICD-10-CM | POA: Insufficient documentation

## 2014-04-10 DIAGNOSIS — Z880 Allergy status to penicillin: Secondary | ICD-10-CM | POA: Insufficient documentation

## 2014-04-10 DIAGNOSIS — Z79899 Other long term (current) drug therapy: Secondary | ICD-10-CM | POA: Insufficient documentation

## 2014-04-10 NOTE — ED Notes (Signed)
denies SI or HI

## 2014-04-10 NOTE — ED Notes (Signed)
Pt requesting detox from crack cocaine, last used 4 hours ago, ETOH as well

## 2014-04-11 ENCOUNTER — Emergency Department (HOSPITAL_COMMUNITY)
Admission: EM | Admit: 2014-04-11 | Discharge: 2014-04-11 | Discharge status: Against Medical Advice | Payer: BC Managed Care – PPO | Attending: Emergency Medicine | Admitting: Emergency Medicine

## 2014-04-11 DIAGNOSIS — F1410 Cocaine abuse, uncomplicated: Secondary | ICD-10-CM

## 2014-04-11 LAB — COMPREHENSIVE METABOLIC PANEL
ALT: 19 U/L (ref 0–53)
AST: 26 U/L (ref 0–37)
Albumin: 3.6 g/dL (ref 3.5–5.2)
Alkaline Phosphatase: 95 U/L (ref 39–117)
BUN: 13 mg/dL (ref 6–23)
CO2: 26 meq/L (ref 19–32)
Calcium: 8.9 mg/dL (ref 8.4–10.5)
Chloride: 101 mEq/L (ref 96–112)
Creatinine, Ser: 0.83 mg/dL (ref 0.50–1.35)
GFR calc Af Amer: >90 mL/min (ref >90)
Glucose, Bld: 86 mg/dL (ref 70–99)
Potassium: 4.1 mEq/L (ref 3.7–5.3)
SODIUM: 139 meq/L (ref 137–147)
Total Bilirubin: 0.3 mg/dL (ref 0.3–1.2)
Total Protein: 6.4 g/dL (ref 6.0–8.3)

## 2014-04-11 LAB — CBC WITH DIFFERENTIAL/PLATELET
Basophils Absolute: 0 10*3/uL (ref 0.0–0.1)
Basophils Relative: 0 % (ref 0–1)
EOS PCT: 7 % — AB (ref 0–5)
Eosinophils Absolute: 0.5 10*3/uL (ref 0.0–0.7)
HCT: 37.2 % — ABNORMAL LOW (ref 39.0–52.0)
Hemoglobin: 12.9 g/dL — ABNORMAL LOW (ref 13.0–17.0)
LYMPHS ABS: 2.8 10*3/uL (ref 0.7–4.0)
LYMPHS PCT: 35 % (ref 12–46)
MCH: 30.1 pg (ref 26.0–34.0)
MCHC: 34.7 g/dL (ref 30.0–36.0)
MCV: 86.7 fL (ref 78.0–100.0)
Monocytes Absolute: 0.9 10*3/uL (ref 0.1–1.0)
Monocytes Relative: 11 % (ref 3–12)
NEUTROS ABS: 3.8 10*3/uL (ref 1.7–7.7)
Neutrophils Relative %: 47 % (ref 43–77)
PLATELETS: 237 10*3/uL (ref 150–400)
RBC: 4.29 MIL/uL (ref 4.22–5.81)
RDW: 13.3 % (ref 11.5–15.5)
WBC: 8 10*3/uL (ref 4.0–10.5)

## 2014-04-11 LAB — ETHANOL: Alcohol, Ethyl (B): <11 mg/dL (ref 0–11)

## 2014-04-11 MED ORDER — ZOLPIDEM TARTRATE 5 MG PO TABS: 5.0000 mg | ORAL_TABLET | Freq: Every evening | ORAL | Status: DC | PRN

## 2014-04-11 MED ORDER — NICOTINE 7 MG/24HR TD PT24
7.0000 mg | MEDICATED_PATCH | Freq: Every day | TRANSDERMAL | Status: DC
  Filled 2014-04-11 (×2): qty 1

## 2014-04-11 MED ORDER — ONDANSETRON HCL 4 MG PO TABS: 4.0000 mg | ORAL_TABLET | Freq: Three times a day (TID) | ORAL | Status: DC | PRN

## 2014-04-11 MED ORDER — ACETAMINOPHEN 325 MG PO TABS: 650.0000 mg | ORAL_TABLET | ORAL | Status: DC | PRN

## 2014-04-11 MED ORDER — IBUPROFEN 400 MG PO TABS: 600.0000 mg | ORAL_TABLET | Freq: Three times a day (TID) | ORAL | Status: DC | PRN

## 2014-04-11 MED ORDER — IPRATROPIUM-ALBUTEROL 0.5-2.5 (3) MG/3ML IN SOLN
3.0000 mL | Freq: Once | RESPIRATORY_TRACT | Status: AC
  Administered 2014-04-11: 3 mL via RESPIRATORY_TRACT
  Filled 2014-04-11: qty 3

## 2014-04-11 MED ORDER — ALUM & MAG HYDROXIDE-SIMETH 200-200-20 MG/5ML PO SUSP: 30.0000 mL | ORAL | Status: DC | PRN

## 2014-04-11 MED ORDER — LORAZEPAM 1 MG PO TABS: 1.0000 mg | ORAL_TABLET | Freq: Three times a day (TID) | ORAL | Status: DC | PRN

## 2014-04-11 NOTE — ED Notes (Signed)
Pt refused to provide urine sample.  Pt decided he did not wish to stay for treatment.

## 2014-04-11 NOTE — ED Provider Notes (Signed)
CSN: [id number]     Arrival date & time 04/10/14  2251 History   First MD Initiated Contact with Patient 04/11/14 0158     Chief Complaint  Patient presents with  . V70.1     (Consider location/radiation/quality/duration/timing/severity/associated sxs/prior Treatment) The history is provided by the patient.   48 year old male comes in requesting detox from cocaine. He states that he has been smoking crack cocaine and large amounts for the last week and states he has not slept in the last 4 days. He has noted some difficulty breathing but denies any chest pain. He has not gone through any detox programs in the past. He does state that he took 2 painkillers earlier in the day because of joint pains he has related to old injuries. He also will drink about a case of beer a week but denies having any today. He denies other drug use. He does admit to occasional suicidal ideation related to being frustrated about his strength abuse but denies specific suicidal plan.  Past Medical History  Diagnosis Date  . Asthma     as child-now with allergy season only.   Past Surgical History  Procedure Laterality Date  . Back surgery    . I&d extremity  10/24/2012    Procedure: IRRIGATION AND DEBRIDEMENT EXTREMITY;  Surgeon: Tami RibasKevin R Kuzma, MD;  [facility];  Service: Orthopedics;  Laterality: Right;  . Open reduction internal fixation (orif) hand Right thumb  . Hydrocele excision Left 08/01/2013    Procedure: HYDROCELECTOMY ADULT;  Surgeon: Ky BarbanMohammad I Javaid, MD;  Location: [facility];  Service: Urology;  Laterality: Left;   History reviewed. No pertinent family history. History  Substance Use Topics  . Smoking status: Current Every Day Smoker -- 0.50 packs/day for 30 years    Types: Cigarettes  . Smokeless tobacco: Not on file  . Alcohol Use: 4.8 oz/week    8 Cans of beer per week     Comment: occ- 2 times weekly    Review of Systems  All other systems reviewed and are negative.     Allergies   Penicillins  Home Medications   Prior to Admission medications   Medication Sig Start Date End Date Taking? Authorizing Provider  albuterol (PROVENTIL HFA;VENTOLIN HFA) 108 (90 BASE) MCG/ACT inhaler Inhale 2 puffs into the lungs every 6 (six) hours as needed. Asthma    Historical Provider, MD  Multiple Vitamin (MULTIVITAMIN WITH MINERALS) TABS Take 1 tablet by mouth daily.    Historical Provider, MD  naproxen sodium (ALEVE) 220 MG tablet Take 220 mg by mouth 3 (three) times daily with meals.    Historical Provider, MD   BP 127/84  Pulse 88  Temp(Src) 97.6 F (36.4 C) (Oral)  Resp 20  Ht 5\' 7"  (1.702 m)  Wt 190 lb (86.183 kg)  BMI 29.75 kg/m2  SpO2 95% Physical Exam  Nursing note and vitals reviewed.  48 year old male, resting comfortably and in no acute distress. Vital signs are normal. Oxygen saturation is 95%, which is normal. Head is normocephalic and atraumatic. PERRLA, EOMI. Oropharynx is clear. Neck is nontender and supple without adenopathy or JVD. Back is nontender and there is no CVA tenderness. Lungs have mild expiratory wheezing without rales or rhonchi. Chest is nontender. Heart has regular rate and rhythm without murmur. Abdomen is soft, flat, nontender without masses or hepatosplenomegaly and peristalsis is normoactive. Extremities have no cyanosis or edema, full range of motion is present. Skin is warm and dry without  rash. Neurologic: Mental status is normal, cranial nerves are intact, there are no motor or sensory deficits.  ED Course  Procedures (including critical care time) Labs Review Results for orders placed during the hospital encounter of 04/11/14  CBC WITH DIFFERENTIAL      Result Value Ref Range   WBC 8.0  4.0 - 10.5 K/uL   RBC 4.29  4.22 - 5.81 MIL/uL   Hemoglobin 12.9 (*) 13.0 - 17.0 g/dL   HCT 63.037.2 (*) 16.039.0 - 10.952.0 %   MCV 86.7  78.0 - 100.0 fL   MCH 30.1  26.0 - 34.0 pg   MCHC 34.7  30.0 - 36.0 g/dL   RDW 32.313.3  55.711.5 - 32.215.5 %   Platelets  237  150 - 400 K/uL   Neutrophils Relative % 47  43 - 77 %   Neutro Abs 3.8  1.7 - 7.7 K/uL   Lymphocytes Relative 35  12 - 46 %   Lymphs Abs 2.8  0.7 - 4.0 K/uL   Monocytes Relative 11  3 - 12 %   Monocytes Absolute 0.9  0.1 - 1.0 K/uL   Eosinophils Relative 7 (*) 0 - 5 %   Eosinophils Absolute 0.5  0.0 - 0.7 K/uL   Basophils Relative 0  0 - 1 %   Basophils Absolute 0.0  0.0 - 0.1 K/uL  COMPREHENSIVE METABOLIC PANEL      Result Value Ref Range   Sodium 139  137 - 147 mEq/L   Potassium 4.1  3.7 - 5.3 mEq/L   Chloride 101  96 - 112 mEq/L   CO2 26  19 - 32 mEq/L   Glucose, Bld 86  70 - 99 mg/dL   BUN 13  6 - 23 mg/dL   Creatinine, Ser 0.250.83  0.50 - 1.35 mg/dL   Calcium 8.9  8.4 - 42.710.5 mg/dL   Total Protein 6.4  6.0 - 8.3 g/dL   Albumin 3.6  3.5 - 5.2 g/dL   AST 26  0 - 37 U/L   ALT 19  0 - 53 U/L   Alkaline Phosphatase 95  39 - 117 U/L   Total Bilirubin 0.3  0.3 - 1.2 mg/dL   GFR calc non Af Amer >90  >90 mL/min   GFR calc Af Amer >90  >90 mL/min  ETHANOL      Result Value Ref Range   Alcohol, Ethyl (B) <11  0 - 11 mg/dL    MDM   Final diagnoses:  Cocaine abuse    Cocaine abuse. The session related to tobacco and cocaine abuse. You will be given an albuterol with ipratropium nebulizer treatment. Suicidal ideation without plan. Screening labs will be obtained and consultation will be obtained with [facility]. Old records are reviewed and I did not see any prior visits for drug problems.  Screening labs are unremarkable. He never provided a urine sample for drug screen. I have advised the patient that there is no inpatient detox for cocaine and that he probably would be referred to an outpatient facility. Patient was concerned that he has been made for transportation. I had requested consultation with [facility], but patient apparently decided he did not wish to wait and left AGAINST MEDICAL ADVICE. He had left the department before is aware that he was signing out.  Dione Boozeavid Chakia Counts,  MD 04/11/14 [phone]

## 2014-04-11 NOTE — ED Notes (Signed)
Pt resting quietly. Respirations regular, even and unlabored.  No distress noted at present time.

## 2014-05-08 ENCOUNTER — Encounter (HOSPITAL_COMMUNITY): Payer: Self-pay | Admitting: Emergency Medicine

## 2014-05-08 ENCOUNTER — Emergency Department (HOSPITAL_COMMUNITY)
Admission: EM | Admit: 2014-05-08 | Discharge: 2014-05-08 | Disposition: A | Discharge status: Home / Self Care | Payer: BC Managed Care – PPO | Attending: Emergency Medicine | Admitting: Emergency Medicine

## 2014-05-08 ENCOUNTER — Emergency Department (HOSPITAL_COMMUNITY): Payer: BC Managed Care – PPO

## 2014-05-08 DIAGNOSIS — Z880 Allergy status to penicillin: Secondary | ICD-10-CM | POA: Insufficient documentation

## 2014-05-08 DIAGNOSIS — F17200 Nicotine dependence, unspecified, uncomplicated: Secondary | ICD-10-CM | POA: Insufficient documentation

## 2014-05-08 DIAGNOSIS — S6990XA Unspecified injury of unspecified wrist, hand and finger(s), initial encounter: Secondary | ICD-10-CM | POA: Insufficient documentation

## 2014-05-08 DIAGNOSIS — J45909 Unspecified asthma, uncomplicated: Secondary | ICD-10-CM | POA: Insufficient documentation

## 2014-05-08 DIAGNOSIS — Y9289 Other specified places as the place of occurrence of the external cause: Secondary | ICD-10-CM | POA: Insufficient documentation

## 2014-05-08 DIAGNOSIS — W540XXA Bitten by dog, initial encounter: Secondary | ICD-10-CM | POA: Insufficient documentation

## 2014-05-08 DIAGNOSIS — Z79899 Other long term (current) drug therapy: Secondary | ICD-10-CM | POA: Insufficient documentation

## 2014-05-08 DIAGNOSIS — Z791 Long term (current) use of non-steroidal anti-inflammatories (NSAID): Secondary | ICD-10-CM | POA: Insufficient documentation

## 2014-05-08 DIAGNOSIS — S60222A Contusion of left hand, initial encounter: Secondary | ICD-10-CM

## 2014-05-08 DIAGNOSIS — Y9389 Activity, other specified: Secondary | ICD-10-CM | POA: Insufficient documentation

## 2014-05-08 DIAGNOSIS — S60229A Contusion of unspecified hand, initial encounter: Secondary | ICD-10-CM | POA: Insufficient documentation

## 2014-05-08 MED ORDER — IBUPROFEN 800 MG PO TABS: 800.0000 mg | ORAL_TABLET | Freq: Three times a day (TID) | ORAL | Status: DC

## 2014-05-08 MED ORDER — TRAMADOL HCL 50 MG PO TABS: 50.0000 mg | ORAL_TABLET | Freq: Four times a day (QID) | ORAL | Status: DC | PRN

## 2014-05-08 NOTE — ED Notes (Signed)
Pt reports increased left hand pain. Pt reports injured left hand x1.5 months ago. Pt denies any increased swelling but reports decreased ROM last several days. No obvious deformity noted.

## 2014-05-08 NOTE — ED Provider Notes (Signed)
CSN: [id number]     Arrival date & time 05/08/14  1415 History   First MD Initiated Contact with Patient 05/08/14 1425     Chief Complaint  Patient presents with  . Hand Pain     (Consider location/radiation/quality/duration/timing/severity/associated sxs/prior Treatment) HPI Comments: Patient presents to ER for evaluation of left hand pain. Patient reports an injury which occurred 1-1/2 months ago. Patient reports that he was bitten over the third knuckle on the left hand by a dog. The wound healed, but since then he has had continued swelling in the area. Rarely uses the hand, pain and swelling worsened. He has not had any redness or drainage from the site. Patient reports that he did not seek medical care when the original injury occurred.  Patient is a 48 y.o. male presenting with hand pain.  Hand Pain    Past Medical History  Diagnosis Date  . Asthma     as child-now with allergy season only.   Past Surgical History  Procedure Laterality Date  . Back surgery    . I&d extremity  10/24/2012    Procedure: IRRIGATION AND DEBRIDEMENT EXTREMITY;  Surgeon: Tami Ribas, MD;  Location: [facility] OR;  Service: Orthopedics;  Laterality: Right;  . Open reduction internal fixation (orif) hand Right thumb  . Hydrocele excision Left 08/01/2013    Procedure: HYDROCELECTOMY ADULT;  Surgeon: Ky Barban, MD;  Location: [facility];  Service: Urology;  Laterality: Left;  . Thumb fusion     History reviewed. No pertinent family history. History  Substance Use Topics  . Smoking status: Current Every Day Smoker -- 0.50 packs/day for 30 years    Types: Cigarettes  . Smokeless tobacco: Not on file  . Alcohol Use: 4.8 oz/week    8 Cans of beer per week     Comment: occ- 2 times weekly    Review of Systems  Musculoskeletal: Positive for arthralgias.  Skin: Negative.       Allergies  Penicillins  Home Medications   Prior to Admission medications   Medication Sig Start Date End Date  Taking? Authorizing Provider  albuterol (PROVENTIL HFA;VENTOLIN HFA) 108 (90 BASE) MCG/ACT inhaler Inhale 2 puffs into the lungs every 6 (six) hours as needed. Asthma    Historical Provider, MD  Multiple Vitamin (MULTIVITAMIN WITH MINERALS) TABS Take 1 tablet by mouth daily.    Historical Provider, MD  naproxen sodium (ALEVE) 220 MG tablet Take 220 mg by mouth 3 (three) times daily with meals.    Historical Provider, MD   BP 132/79  Pulse 70  Temp(Src) 98.6 F (37 C) (Oral)  Resp 16  Ht 5\' 7"  (1.702 m)  Wt 180 lb (81.647 kg)  BMI 28.19 kg/m2  SpO2 98% Physical Exam  Constitutional: He appears well-developed and well-nourished.  HENT:  Head: Normocephalic and atraumatic.  Eyes: Pupils are equal, round, and reactive to light.  Neck: Normal range of motion.  Cardiovascular: Normal rate.   Pulmonary/Chest: Effort normal.  Musculoskeletal:       Hands:   ED Course  Procedures (including critical care time) Labs Review Labs Reviewed - No data to display  Imaging Review Dg Hand Complete Left  05/08/2014   CLINICAL DATA:  Trauma.  EXAM: LEFT HAND - COMPLETE 3+ VIEW  COMPARISON:  None.  FINDINGS: No acute bony or joint abnormality identified. No evidence of fracture. Diffuse degenerative change.  IMPRESSION: No acute abnormality.   Electronically Signed   By: Maisie Fus  Register  On: 05/08/2014 14:41     EKG Interpretation None      MDM   Final diagnoses:  None   hand contusion  Patient presents to ER for evaluation of pain and swelling around the third MCP joint on the left hand. Patient reports injury one and a half months ago. Patient reports he was bitten by a dog. There is a well-healed wound but does not have any signs of infection currently. Area is swollen, however. The swollen area is tender, nonfluctuant. X-ray of the hand does not show any bony abnormality. Patient does have a history of suffering osteomyelitis postoperatively in the other hand. At this point, however,  I do not see any signs of soft tissue infection and no sequela of osteomyelitis on the x-ray. Patient was previously seen by Doctor Drema PryWeiner Gold for his right hand problems, will refer back to walking cold for repeat evaluation of left hand to rule out occult injury and infection, but does not record any further workup today. Analgesia, rest until followup.    Gilda Creasehristopher J. Pollina, MD 05/08/14 1500

## 2014-05-08 NOTE — Discharge Instructions (Signed)
Contusion °A contusion is a deep bruise. Contusions happen when an injury causes bleeding under the skin. Signs of bruising include pain, puffiness (swelling), and discolored skin. The contusion may turn blue, purple, or yellow. °HOME CARE  °· Put ice on the injured area. °¨ Put ice in a plastic bag. °¨ Place a towel between your skin and the bag. °¨ Leave the ice on for 15-20 minutes, 03-04 times a day. °· Only take medicine as told by your doctor. °· Rest the injured area. °· If possible, raise (elevate) the injured area to lessen puffiness. °GET HELP RIGHT AWAY IF:  °· You have more bruising or puffiness. °· You have pain that is getting worse. °· Your puffiness or pain is not helped by medicine. °MAKE SURE YOU:  °· Understand these instructions. °· Will watch your condition. °· Will get help right away if you are not doing well or get worse. °Document Released: 03/21/2008 Document Revised: 12/26/2011 Document Reviewed: 08/08/2011 °Exit[employer]® Patient Information ©2015 [employer]. This information is not intended to replace advice given to you by your health care provider. Make sure you discuss any questions you have with your health care provider. ° °

## 2015-04-05 ENCOUNTER — Emergency Department (HOSPITAL_COMMUNITY)
Admission: EM | Admit: 2015-04-05 | Discharge: 2015-04-06 | Disposition: A | Discharge status: Home / Self Care | Payer: Self-pay | Attending: Emergency Medicine | Admitting: Emergency Medicine

## 2015-04-05 ENCOUNTER — Encounter (HOSPITAL_COMMUNITY): Payer: Self-pay | Admitting: *Deleted

## 2015-04-05 DIAGNOSIS — Z791 Long term (current) use of non-steroidal anti-inflammatories (NSAID): Secondary | ICD-10-CM | POA: Insufficient documentation

## 2015-04-05 DIAGNOSIS — F1424 Cocaine dependence with cocaine-induced mood disorder: Secondary | ICD-10-CM | POA: Insufficient documentation

## 2015-04-05 DIAGNOSIS — Z720 Tobacco use: Secondary | ICD-10-CM | POA: Insufficient documentation

## 2015-04-05 DIAGNOSIS — F1324 Sedative, hypnotic or anxiolytic dependence with sedative, hypnotic or anxiolytic-induced mood disorder: Secondary | ICD-10-CM | POA: Insufficient documentation

## 2015-04-05 DIAGNOSIS — F419 Anxiety disorder, unspecified: Secondary | ICD-10-CM | POA: Insufficient documentation

## 2015-04-05 DIAGNOSIS — Z79899 Other long term (current) drug therapy: Secondary | ICD-10-CM | POA: Insufficient documentation

## 2015-04-05 DIAGNOSIS — Z880 Allergy status to penicillin: Secondary | ICD-10-CM | POA: Insufficient documentation

## 2015-04-05 DIAGNOSIS — R45850 Homicidal ideations: Secondary | ICD-10-CM

## 2015-04-05 DIAGNOSIS — J45909 Unspecified asthma, uncomplicated: Secondary | ICD-10-CM | POA: Insufficient documentation

## 2015-04-05 DIAGNOSIS — F1910 Other psychoactive substance abuse, uncomplicated: Secondary | ICD-10-CM | POA: Diagnosis present

## 2015-04-05 DIAGNOSIS — F1994 Other psychoactive substance use, unspecified with psychoactive substance-induced mood disorder: Secondary | ICD-10-CM

## 2015-04-05 LAB — COMPREHENSIVE METABOLIC PANEL
ALBUMIN: 4.7 g/dL (ref 3.5–5.0)
ALT: 18 U/L (ref 17–63)
AST: 19 U/L (ref 15–41)
Alkaline Phosphatase: 91 U/L (ref 38–126)
Anion gap: 6 (ref 5–15)
BUN: 28 mg/dL — AB (ref 6–20)
CHLORIDE: 106 mmol/L (ref 101–111)
CO2: 27 mmol/L (ref 22–32)
CREATININE: 0.99 mg/dL (ref 0.61–1.24)
Calcium: 9.6 mg/dL (ref 8.9–10.3)
GFR calc non Af Amer: >60 mL/min (ref >60)
Glucose, Bld: 93 mg/dL (ref 65–99)
POTASSIUM: 4.1 mmol/L (ref 3.5–5.1)
Sodium: 139 mmol/L (ref 135–145)
TOTAL PROTEIN: 7.7 g/dL (ref 6.5–8.1)
Total Bilirubin: 1 mg/dL (ref 0.3–1.2)

## 2015-04-05 LAB — CBC WITH DIFFERENTIAL/PLATELET
BASOS PCT: 1 % (ref 0–1)
Basophils Absolute: 0.1 10*3/uL (ref 0.0–0.1)
EOS ABS: 0.3 10*3/uL (ref 0.0–0.7)
EOS PCT: 4 % (ref 0–5)
HCT: 48.2 % (ref 39.0–52.0)
HEMOGLOBIN: 16.7 g/dL (ref 13.0–17.0)
LYMPHS PCT: 37 % (ref 12–46)
Lymphs Abs: 2.5 10*3/uL (ref 0.7–4.0)
MCH: 31 pg (ref 26.0–34.0)
MCHC: 34.6 g/dL (ref 30.0–36.0)
MCV: 89.4 fL (ref 78.0–100.0)
MONO ABS: 0.6 10*3/uL (ref 0.1–1.0)
Monocytes Relative: 10 % (ref 3–12)
NEUTROS ABS: 3.2 10*3/uL (ref 1.7–7.7)
Neutrophils Relative %: 48 % (ref 43–77)
PLATELETS: 319 10*3/uL (ref 150–400)
RBC: 5.39 MIL/uL (ref 4.22–5.81)
RDW: 13 % (ref 11.5–15.5)
WBC: 6.7 10*3/uL (ref 4.0–10.5)

## 2015-04-05 LAB — URINALYSIS, ROUTINE W REFLEX MICROSCOPIC
Bilirubin Urine: NEGATIVE
Glucose, UA: NEGATIVE mg/dL
Hgb urine dipstick: NEGATIVE
Ketones, ur: NEGATIVE mg/dL
Leukocytes, UA: NEGATIVE
Nitrite: NEGATIVE
PH: 6 (ref 5.0–8.0)
PROTEIN: NEGATIVE mg/dL
Specific Gravity, Urine: 1.025 (ref 1.005–1.030)
Urobilinogen, UA: 0.2 mg/dL (ref 0.0–1.0)

## 2015-04-05 LAB — RAPID URINE DRUG SCREEN, HOSP PERFORMED
Amphetamines: NOT DETECTED
BENZODIAZEPINES: POSITIVE — AB
Barbiturates: NOT DETECTED
COCAINE: POSITIVE — AB
OPIATES: NOT DETECTED
Tetrahydrocannabinol: NOT DETECTED

## 2015-04-05 LAB — ETHANOL

## 2015-04-05 MED ORDER — ONDANSETRON HCL 4 MG PO TABS: 4.0000 mg | ORAL_TABLET | Freq: Three times a day (TID) | ORAL | Status: DC | PRN

## 2015-04-05 MED ORDER — VITAMIN B-1 100 MG PO TABS
100.0000 mg | ORAL_TABLET | Freq: Every day | ORAL | Status: DC
  Administered 2015-04-06: 100 mg via ORAL
  Filled 2015-04-05 (×2): qty 1

## 2015-04-05 MED ORDER — NICOTINE 21 MG/24HR TD PT24
21.0000 mg | MEDICATED_PATCH | Freq: Every day | TRANSDERMAL | Status: DC
  Administered 2015-04-05 – 2015-04-06 (×2): 21 mg via TRANSDERMAL
  Filled 2015-04-05 (×2): qty 1

## 2015-04-05 MED ORDER — THIAMINE HCL 100 MG/ML IJ SOLN: 100.0000 mg | Freq: Every day | INTRAMUSCULAR | Status: DC

## 2015-04-05 MED ORDER — ALBUTEROL SULFATE HFA 108 (90 BASE) MCG/ACT IN AERS: 2.0000 | INHALATION_SPRAY | Freq: Four times a day (QID) | RESPIRATORY_TRACT | Status: DC | PRN

## 2015-04-05 MED ORDER — LORAZEPAM 1 MG PO TABS: 0.0000 mg | ORAL_TABLET | Freq: Two times a day (BID) | ORAL | Status: DC

## 2015-04-05 MED ORDER — ACETAMINOPHEN 325 MG PO TABS: 650.0000 mg | ORAL_TABLET | ORAL | Status: DC | PRN

## 2015-04-05 MED ORDER — LORAZEPAM 1 MG PO TABS: 0.0000 mg | ORAL_TABLET | Freq: Four times a day (QID) | ORAL | Status: DC

## 2015-04-05 NOTE — BH Assessment (Signed)
Binnie Rail, [facility], confirms adult unit is currently at capacity. Faxed clinical information to the following facilities for placement:  [facility]   [address] [place], [facility], [facility] Triage Specialist [phone]
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
## 2015-04-05 NOTE — ED Notes (Signed)
Ford, with [facility] states pt does meet in patient criteria but no beds are available at [facility]. [facility] will continue to try to find placement for pt.

## 2015-04-05 NOTE — ED Provider Notes (Signed)
Call received from [facility], they plan on admitting the patient to an outlying facility.  Mancel Bale, MD 04/05/15 [phone]

## 2015-04-05 NOTE — ED Provider Notes (Signed)
CSN: [id number]     Arrival date & time 04/05/15  2006 History   First MD Initiated Contact with Patient 04/05/15 2049     Chief Complaint  Patient presents with  . V70.1     (Consider location/radiation/quality/duration/timing/severity/associated sxs/prior Treatment) HPI Comments: Patient brought in by police under IVC after expressing thoughts of hurting his girlfriend's friend. Patient states he got in argument with his girlfriend and her friend yesterday and her friend called the police department because he was threatening towards her. He denies any suicidal or homicidal thoughts at this time. He states he uses cocaine and Xanax, last use yesterday. He drinks about a can of beer a week. Denies getting shaky but doesn't drink. He is a smoker. He denies any IV drug abuse. He denies any history of anxiety or depression or any psychiatric hospitalizations. He is calm and cooperative and denies hearing any voices. Please were called because he was threatening towards the friend of his girlfriend.  The history is provided by the police and the patient.    Past Medical History  Diagnosis Date  . Asthma     as child-now with allergy season only.   Past Surgical History  Procedure Laterality Date  . Back surgery    . I&d extremity  10/24/2012    Procedure: IRRIGATION AND DEBRIDEMENT EXTREMITY;  Surgeon: Tami Ribas, MD;  Location: [facility] OR;  Service: Orthopedics;  Laterality: Right;  . Open reduction internal fixation (orif) hand Right thumb  . Hydrocele excision Left 08/01/2013    Procedure: HYDROCELECTOMY ADULT;  Surgeon: Ky Barban, MD;  Location: [facility] ORS;  Service: Urology;  Laterality: Left;  . Thumb fusion     History reviewed. No pertinent family history. History  Substance Use Topics  . Smoking status: Current Every Day Smoker -- 0.50 packs/day for 30 years    Types: Cigarettes  . Smokeless tobacco: Not on file  . Alcohol Use: 4.8 oz/week    8 Cans of beer per week   Comment: occ- 2 times weekly    Review of Systems  Constitutional: Negative for fever, activity change and appetite change.  HENT: Negative for congestion and rhinorrhea.   Respiratory: Negative for cough, chest tightness and shortness of breath.   Cardiovascular: Negative for chest pain.  Gastrointestinal: Negative for nausea, vomiting and abdominal pain.  Genitourinary: Negative for dysuria and hematuria.  Musculoskeletal: Negative for myalgias.  Skin: Negative for rash.  Neurological: Negative for dizziness, weakness, light-headedness and headaches.  Psychiatric/Behavioral: Positive for behavioral problems, decreased concentration and agitation. The patient is nervous/anxious.   A complete 10 system review of systems was obtained and all systems are negative except as noted in the HPI and PMH.      Allergies  Penicillins  Home Medications   Prior to Admission medications   Medication Sig Start Date End Date Taking? Authorizing Provider  albuterol (PROVENTIL HFA;VENTOLIN HFA) 108 (90 BASE) MCG/ACT inhaler Inhale 2 puffs into the lungs every 6 (six) hours as needed for wheezing or shortness of breath.   Yes Historical Provider, MD  Multiple Vitamin (MULTIVITAMIN WITH MINERALS) TABS tablet Take 1 tablet by mouth daily.   Yes Historical Provider, MD  naproxen sodium (ALEVE) 220 MG tablet Take 220 mg by mouth 2 (two) times daily as needed (Pain).   Yes Historical Provider, MD  Omega-3 Fatty Acids (FISH OIL) 1000 MG CAPS Take 2,000 mg by mouth daily.   Yes Historical Provider, MD  ibuprofen (ADVIL,MOTRIN) 800 MG tablet  Take 1 tablet (800 mg total) by mouth 3 (three) times daily. 05/08/14   Gilda Crease, MD  Multiple Vitamin (MULTIVITAMIN WITH MINERALS) TABS Take 1 tablet by mouth daily.    Historical Provider, MD  naproxen sodium (ALEVE) 220 MG tablet Take 220 mg by mouth 3 (three) times daily with meals.    Historical Provider, MD  traMADol (ULTRAM) 50 MG tablet Take 1 tablet  (50 mg total) by mouth every 6 (six) hours as needed. 05/08/14   Gilda Crease, MD   BP 129/90 mmHg  Pulse 93  Temp(Src) 98.4 F (36.9 C) (Oral)  Resp 24  Ht 5\' 7"  (1.702 m)  Wt 155 lb (70.308 kg)  BMI 24.27 kg/m2  SpO2 100% Physical Exam  Constitutional: He is oriented to person, place, and time. He appears well-developed and well-nourished. No distress.  HENT:  Head: Normocephalic and atraumatic.  Mouth/Throat: Oropharynx is clear and moist. No oropharyngeal exudate.  Eyes: Conjunctivae and EOM are normal. Pupils are equal, round, and reactive to light.  Neck: Normal range of motion. Neck supple.  No meningismus.  Cardiovascular: Normal rate, regular rhythm, normal heart sounds and intact distal pulses.   No murmur heard. Pulmonary/Chest: Effort normal and breath sounds normal. No respiratory distress.  Abdominal: Soft. There is no tenderness. There is no rebound and no guarding.  Musculoskeletal: Normal range of motion. He exhibits no edema or tenderness.  Neurological: He is alert and oriented to person, place, and time. No cranial nerve deficit. He exhibits normal muscle tone. Coordination normal.  No ataxia on finger to nose bilaterally. No pronator drift. 5/5 strength throughout. CN 2-12 intact. Negative Romberg. Equal grip strength. Sensation intact. Gait is normal.   Skin: Skin is warm.  Psychiatric: He has a normal mood and affect. His behavior is normal.  Nursing note and vitals reviewed.   ED Course  Procedures (including critical care time) Labs Review Labs Reviewed  COMPREHENSIVE METABOLIC PANEL - Abnormal; Notable for the following:    BUN 28 (*)    All other components within normal limits  CBC WITH DIFFERENTIAL/PLATELET  ETHANOL  URINE RAPID DRUG SCREEN, HOSP PERFORMED  URINALYSIS, ROUTINE W REFLEX MICROSCOPIC (NOT AT [facility])    Imaging Review No results found.   EKG Interpretation None      MDM   Final diagnoses:  Homicidal ideation    brought in by police after expressing homicidal thoughts towards girlfriend's friend. Has been abusing xanax and cocaine.   Calm and cooperative. Denies SI or HI at this time. Denies any hallucinations or hearing voices.  Screening labs obtained. Holding orders placed. Patient medically clear for [facility] consult.    Glynn Octave, MD 04/05/15 2159

## 2015-04-05 NOTE — BH Assessment (Signed)
Received notification of TTS consult request. Spoke to Dr. Glynn Octave who said Pt was IVC'd by girlfriend who states Pt has been abusing substances and made homicidal threats towards her. Tele-assessment will be initiated.  Harlin Rain Patsy [place], LPC, [facility], [facility] Triage Specialist [phone]
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
## 2015-04-06 ENCOUNTER — Emergency Department (HOSPITAL_COMMUNITY)
Admission: EM | Admit: 2015-04-06 | Discharge: 2015-04-06 | Disposition: A | Discharge status: Home / Self Care | Payer: Self-pay | Attending: Emergency Medicine | Admitting: Emergency Medicine

## 2015-04-06 ENCOUNTER — Encounter (HOSPITAL_COMMUNITY): Payer: Self-pay | Admitting: *Deleted

## 2015-04-06 ENCOUNTER — Emergency Department (HOSPITAL_COMMUNITY): Payer: Self-pay

## 2015-04-06 DIAGNOSIS — S6991XA Unspecified injury of right wrist, hand and finger(s), initial encounter: Secondary | ICD-10-CM | POA: Insufficient documentation

## 2015-04-06 DIAGNOSIS — F1910 Other psychoactive substance abuse, uncomplicated: Secondary | ICD-10-CM | POA: Diagnosis present

## 2015-04-06 DIAGNOSIS — S20229A Contusion of unspecified back wall of thorax, initial encounter: Secondary | ICD-10-CM

## 2015-04-06 DIAGNOSIS — S300XXA Contusion of lower back and pelvis, initial encounter: Secondary | ICD-10-CM | POA: Insufficient documentation

## 2015-04-06 DIAGNOSIS — R45850 Homicidal ideations: Secondary | ICD-10-CM

## 2015-04-06 DIAGNOSIS — J45909 Unspecified asthma, uncomplicated: Secondary | ICD-10-CM | POA: Insufficient documentation

## 2015-04-06 DIAGNOSIS — Y9389 Activity, other specified: Secondary | ICD-10-CM | POA: Insufficient documentation

## 2015-04-06 DIAGNOSIS — Y998 Other external cause status: Secondary | ICD-10-CM | POA: Insufficient documentation

## 2015-04-06 DIAGNOSIS — Z79899 Other long term (current) drug therapy: Secondary | ICD-10-CM | POA: Insufficient documentation

## 2015-04-06 DIAGNOSIS — Y9289 Other specified places as the place of occurrence of the external cause: Secondary | ICD-10-CM | POA: Insufficient documentation

## 2015-04-06 DIAGNOSIS — Z880 Allergy status to penicillin: Secondary | ICD-10-CM | POA: Insufficient documentation

## 2015-04-06 DIAGNOSIS — S0990XA Unspecified injury of head, initial encounter: Secondary | ICD-10-CM | POA: Insufficient documentation

## 2015-04-06 DIAGNOSIS — W06XXXA Fall from bed, initial encounter: Secondary | ICD-10-CM | POA: Insufficient documentation

## 2015-04-06 DIAGNOSIS — Z720 Tobacco use: Secondary | ICD-10-CM | POA: Insufficient documentation

## 2015-04-06 DIAGNOSIS — S24109A Unspecified injury at unspecified level of thoracic spinal cord, initial encounter: Secondary | ICD-10-CM | POA: Insufficient documentation

## 2015-04-06 DIAGNOSIS — F1994 Other psychoactive substance use, unspecified with psychoactive substance-induced mood disorder: Secondary | ICD-10-CM | POA: Diagnosis present

## 2015-04-06 DIAGNOSIS — Z791 Long term (current) use of non-steroidal anti-inflammatories (NSAID): Secondary | ICD-10-CM | POA: Insufficient documentation

## 2015-04-06 MED ORDER — KETOROLAC TROMETHAMINE 10 MG PO TABS
10.0000 mg | ORAL_TABLET | Freq: Once | ORAL | Status: AC
  Administered 2015-04-06: 10 mg via ORAL
  Filled 2015-04-06: qty 1

## 2015-04-06 MED ORDER — MELOXICAM 15 MG PO TABS: 15.0000 mg | ORAL_TABLET | Freq: Every day | ORAL | Status: DC

## 2015-04-06 MED ORDER — METHOCARBAMOL 500 MG PO TABS
1000.0000 mg | ORAL_TABLET | Freq: Once | ORAL | Status: AC
  Administered 2015-04-06: 1000 mg via ORAL
  Filled 2015-04-06: qty 2

## 2015-04-06 NOTE — Discharge Instructions (Signed)
No new broken bones noted on your thoracic or your lumbar spine x-ray. There is a broken screw noted on your lumbar x-ray, of questionable age. Please see your orthopedic specialists for additional evaluation of this area.  Contusion A contusion is a deep bruise. Contusions happen when an injury causes bleeding under the skin. Signs of bruising include pain, puffiness (swelling), and discolored skin. The contusion may turn blue, purple, or yellow. HOME CARE   Put ice on the injured area.  Put ice in a plastic bag.  Place a towel between your skin and the bag.  Leave the ice on for 15-20 minutes, 03-04 times a day.  Only take medicine as told by your doctor.  Rest the injured area.  If possible, raise (elevate) the injured area to lessen puffiness. GET HELP RIGHT AWAY IF:   You have more bruising or puffiness.  You have pain that is getting worse.  Your puffiness or pain is not helped by medicine. MAKE SURE YOU:   Understand these instructions.  Will watch your condition.  Will get help right away if you are not doing well or get worse. Document Released: 03/21/2008 Document Revised: 12/26/2011 Document Reviewed: 08/08/2011 [facility] Patient Information 2015 [place], [place]. This information is not intended to replace advice given to you by your health care provider. Make sure you discuss any questions you have with your health care provider.

## 2015-04-06 NOTE — Progress Notes (Signed)
CSW updated by Renata Caprice, DNP, of his evaluation and that Psych is recommending discharge. Per Dr Lucianne Muss, CSW was requested to contact patient's girlfriend to inform her of potential dc today. Attempted contact at [phone]- no answer but requested a call back. CSW spoke with [facility] regarding above.   Reece Levy, MSW, The[facility] Majors [phone]
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
## 2015-04-06 NOTE — ED Notes (Signed)
Breakfast tray given. Hospital provided sitter on way to ED to sit with patient so [employer] can be signed off.

## 2015-04-06 NOTE — ED Notes (Signed)
Pt made aware to return if symptoms worsen or if any life threatening symptoms occur.  Pt made aware that lisa, girlfriend called to report that pt would be able to meet her at her house to collect his belongings under police supervision.

## 2015-04-06 NOTE — Discharge Instructions (Signed)
Please call your doctor for a followup appointment within 24-48 hours. When you talk to your doctor please let them know that you were seen in the emergency department and have them acquire all of your records so that they can discuss the findings with you and formulate a treatment plan to fully care for your new and ongoing problems. ° ° °Emergency Department Resource Guide °1) Find a Doctor and Pay Out of Pocket °Although you won't have to find out who is covered by your insurance plan, it is a good idea to ask around and get recommendations. You will then need to call the office and see if the doctor you have chosen will accept you as a new patient and what types of options they offer for patients who are self-pay. Some doctors offer discounts or will set up payment plans for their patients who do not have insurance, but you will need to ask so you aren't surprised when you get to your appointment. ° °2) Contact Your Local Health Department °Not all health departments have doctors that can see patients for sick visits, but many do, so it is worth a call to see if yours does. If you don't know where your local health department is, you can check in your phone book. The CDC also has a tool to help you locate your state's health department, and many state websites also have listings of all of their local health departments. ° °3) Find a Walk-in Clinic °If your illness is not likely to be very severe or complicated, you may want to try a walk in clinic. These are popping up all over the country in pharmacies, drugstores, and shopping centers. They're usually staffed by nurse practitioners or physician assistants that have been trained to treat common illnesses and complaints. They're usually fairly quick and inexpensive. However, if you have serious medical issues or chronic medical problems, these are probably not your best option. ° °No Primary Care Doctor: °- Call [facility] at  832-8000 - they can help you  locate a primary care doctor that  accepts your insurance, provides certain services, etc. °- Physician Referral Service- 1-800-533-3463 ° °Chronic Pain Problems: °Organization         Address  Phone   Notes  °[facility]  (336) 297-2271 Patients need to be referred by their primary care doctor.  ° °Medication Assistance: °Organization         Address  Phone   Notes  °[place] [facility] [address] °[place], [place] [address] (336) 641-8030 --Must be a resident of [place] °-- Must have NO insurance coverage whatsoever (no Medicaid/ Medicare, etc.) °-- The pt. MUST have a primary care doctor that directs their care regularly and follows them in the community °  °[facility]  (866) 331-1348   °[employer]  (888) 892-1162   ° °Agencies that provide inexpensive medical care: °Organization         Address  Phone   Notes  °[facility]  (336) 832-8035   °[facility]    (336) 832-7272   °[facility] [address] °[place], [place] [address] (336) 832-4777   °[facility] [place] [address], °[place] (336) 271-4999   °[facility]    (336) 373-0678   °[facility]    (336) 272-1050   °[facility] ° [address], [place] Phone:  (336)   832-4444, Fax:  (336) 832-4440 Hours of Operation:  9 am - 6 pm, M-F.  Also accepts Medicaid/Medicare and self-pay.  °[facility] ° [address], [place] Phone: (336) 832-3150, Fax: (336) 832-3151. Hours of Operation:  8:30 am - 5:30 pm, M-F.  Also accepts Medicaid and self-pay.  °[facility] [place] [address], [place] Phone: (336) 878-6027   °[facility] [address], [place], [place] (336)723-1848, Ext. 123 Mondays & Thursdays: 7-9 AM.  First 15 patients are seen on a first come, first serve basis. °  ° °Medicaid-accepting [place]  Providers: ° °Organization         Address  Phone   Notes  °[facility] [address], [place] (336) 641-2100 Also accepts self-pay patients.  °[facility] [address], [place] ° (336) 856-9996   °[facility] [address], [place] (336) 288-8857   °[facility] [address] [place] [address], [place] (336) 299-7000   °Veita Bland [address], [place]  ° (336) 373-1557 Only accepts [place] Access Medicaid patients after they have their name applied to their card.  ° °Self-Pay (no insurance) in [place]: ° °Organization         Address  Phone   Notes  °Sickle Cell Patients, [facility] [address], [place] (336) 832-1970   °[facility] [address], [place] (336) 832-4400   °[facility] ° [address], [place] (336) 992-4800   °[facility]/Dr. Osei-Bonsu ° [address] [place] [address], [place] or [address], [place] (336) 841-8500 Phone number for both [place] and [place] locations is the same.  °[facility] [address], [place] (336) 299-0000   °[facility] [address] [place] [address], [place] or [address] (336) 852-7530 °(336) 878-2260   °[facility] [address],  (336) 350-1642, phone; (336) 294-5005, fax Sees patients 1st and 3rd Saturday of every month.  Must not qualify for public or private insurance (i.e. Medicaid, Medicare, [place] Health Choice, Veterans' Benefits) • Household income should be no more than 200% of the poverty level •The clinic cannot treat you if you are pregnant or think you are pregnant • Sexually transmitted diseases are not treated at the clinic.  ° ° °Dental Care: °Organization         Address  Phone  Notes  °[facility] [address],  (336) 641-6152 Accepts children up to age 21 who are enrolled in Medicaid or [place] Health Choice; pregnant women with a Medicaid card; and children who have applied for Medicaid or [place] Health Choice, but were declined, whose parents can pay a reduced fee at time of service.  °[facility] [place]  [address], [place] (336) 641-7733 Accepts children up to age 21 who are enrolled in Medicaid or [place] Health Choice; pregnant women with a Medicaid card; and children who have applied for Medicaid or [place] Health Choice, but were declined, whose parents can pay a reduced fee at time of service.  °[facility] ° [address],  (336) 641-4533 Patients are seen by appointment only. Walk-ins are   not accepted. [facility] will see patients 18 years of age and older. °Monday - Tuesday (8am-5pm) °Most Wednesdays (8:30-5pm) °$30 per visit, cash only  °[facility] ° [address], [place] (336) 641-4533 Patients are seen by appointment only. Walk-ins are not accepted. [facility] will see patients 18 years of age and older. °One Wednesday Evening (Monthly: Volunteer Based).  $30 per visit, cash only  °[facility]  (919) 537-3737 for adults; Children under age 4, call Graduate Pediatric Dentistry at (919) [phone]. Children aged 4-14, please call (919) [phone] to request a pediatric application. ° Dental services are provided in all areas of dental care including fillings, crowns and bridges, complete and partial dentures, implants, gum treatment, root canals, and extractions. Preventive care is also provided. Treatment is provided to both adults and children. °Patients are selected via a lottery and there is often a waiting list. °  °[facility] [address], °[place] ° (336) 763-8833 [email or web address] °  °[facility]  [address], [place], [place] (336)723-1848, Ext. 123 Second and Fourth Thursday of each month, opens at 6:30 AM; Clinic ends at 9 AM.  Patients are seen on a first-come first-served basis, and a limited number are seen during each clinic.  ° °[facility] ° [address], [place], [place] (336) 723-7904   Eligibility Requirements °You must have lived in [place], [place], or [place] counties for at least the last three months. °  You cannot be eligible for state or federal sponsored healthcare insurance, including Veterans Administration, Medicaid, or Medicare. °  You generally cannot be eligible for healthcare insurance through your employer.  °  How to apply: °Eligibility screenings are held every Tuesday and Wednesday afternoon from 1:00 pm until 4:00 pm. You do not need an appointment for the interview!  °[facility] [address], [place], [place] 336-631-2330   °[facility]  336-342-8273   °[place] [facility]  336-703-3100   °[facility]  336-570-6415   ° °Behavioral Health Resources in the Community: °Intensive Outpatient Programs °Organization         Address  Phone  Notes  °[facility] [address], [place], [place] 336-878-6098   °[facility] [address], [place], [place] 336-832-9800   °[facility] [address], [place], [place] ° 336-882-2125   °[facility] [address],  °[place], [place] 1-800-853-5163 or 336-641-4981   °Substance Abuse Resources °Organization         Address  Phone  Notes  °[facility]  336-882-2125   °[facility]  336-784-9470   °[facility]  336-285-9073   °[facility]  336-845-3988   °Residential & Outpatient Substance Abuse Program  1-800-659-3381   °Psychological Services °Organization         Address  Phone  Notes  °[facility]  336- 832-9600     °[facility]  336- 378-7881   °[facility] [address], [place] 1-800-853-5163 or 336-641-4981   ° °Mobile Crisis Teams °Organization         Address  Phone  Notes  °[facility], Mobile Crisis Care Unit  1-877-626-1772   °Assertive °[facility] ° [address] [place],  336-834-9664   °Sharon DeEsch [address] °[place]  336-554-5454   ° °  Self-Help/Support Groups °Organization         Address  Phone             Notes  °[facility] - variety of support groups  336- 373-1402 Call for more information  °Narcotics Anonymous (NA), [facility] [address], °[place]  2 meetings at this location  ° °Residential Treatment Programs °Organization         Address  Phone  Notes  °[facility] [address],    °[place]  1-866-801-8205   °[facility] ° [address], [place], [place] 704-293-8524   °[facility] [address], [place] 336-845-3988 Admissions: 8am-3pm M-F  °[facility] [address]    °[place], [place] 336-841-1104   °[facility] [address], [place], [place] 336-379-7146   °[facility] [address]  °[place], [place] 336-285-9073   °[facility] - Intensive Outpatient [address], [place], [place] 336-852-3033   °[facility] [address]  °[place], [place] 1-877-615-2722 or 336-784-9470   °[facility] [address] [place], [place] 336-227-7417 Accepts Medicaid  °[facility] [address]  °[place] 1-800-659-3381 Substance Abuse/Addiction Treatment  ° °[place] Behavioral Health Resources °Organization         Address  Phone  Notes  °[facility]  (888) 581-9988   °Julie Brannon, PhD [address] [place], [place]   (336) 349-5553 or (336) 951-0000    °[facility]   [address] °[place], [place] (336) 349-4454   °[facility] [address], [place], [place] (336) 342-8316 Insurance/Medicaid/sponsorship through [facility]  °[facility] [address]                                    [place], [place] (336) 342-8316 Therapy/tele-psych/case  °[facility] [address]  ° [place],  (336) 349-2233    °Dr. Arfeen  (336) 349-4544   °[facility] [place]  [employer] [place] [facility] 1) [address],  °2) [address], [place] °3)  [address], [place] (336) 349-3220 °(336) 342-7768 ° °(336) 342-8140   °[place] [facility] (336) 342-1394 or (336) 342-3537 (After Hours)    ° ° ° °

## 2015-04-06 NOTE — ED Provider Notes (Signed)
Cleared by [facility] - d/w Renata Caprice - states only gets ill with substance abuse - he is not currently homicidal, suicidal or dangerous.    Eber Hong, MD 04/06/15 1230

## 2015-04-06 NOTE — ED Provider Notes (Signed)
CSN: [id number]     Arrival date & time 04/06/15  1745 History   None   This chart was scribed for non-physician practitioner, [name], PA-C, working with Glynn Octave, MD by Marica Otter, ED Scribe. This patient was seen in room APFT21/APFT21 and the patient's care was started at 6:51 PM.  Chief Complaint  Patient presents with  . Fall   Patient is a 49 y.o. male presenting with fall. The history is provided by the patient. No language interpreter was used.  Fall This is a new problem. The current episode started 1 to 2 hours ago. The problem occurs rarely. The problem has not changed since onset.Associated symptoms include headaches. Nothing aggravates the symptoms. Nothing relieves the symptoms. He has tried nothing for the symptoms.   PCP: Toma Deiters, MD HPI Comments: Jacob Reilly is a 49 y.o. male, with PMH noted below including back surgery, who presents to the Emergency Department complaining of a fall with associated lower back pain, head pain, and right hand pain/numbness onset approximately 2 hours ago. Pt reports he fell from his top bunk bed, hit the toilet, and landed on his back. Pt denies neck pain, being on any blood thinners. Pt last tetanus shot is unknown.   Past Medical History  Diagnosis Date  . Asthma     as child-now with allergy season only.   Past Surgical History  Procedure Laterality Date  . Back surgery    . I&d extremity  10/24/2012    Procedure: IRRIGATION AND DEBRIDEMENT EXTREMITY;  Surgeon: Tami Ribas, MD;  Location: [facility] OR;  Service: Orthopedics;  Laterality: Right;  . Open reduction internal fixation (orif) hand Right thumb  . Hydrocele excision Left 08/01/2013    Procedure: HYDROCELECTOMY ADULT;  Surgeon: Ky Barban, MD;  Location: [facility];  Service: Urology;  Laterality: Left;  . Thumb fusion     History reviewed. No pertinent family history. History  Substance Use Topics  . Smoking status: Current Every Day Smoker -- 0.50  packs/day for 30 years    Types: Cigarettes  . Smokeless tobacco: Not on file  . Alcohol Use: 4.8 oz/week    8 Cans of beer per week     Comment: occ- 2 times weekly    Review of Systems  Constitutional: Negative for fever and chills.  Musculoskeletal: Positive for back pain. Negative for neck pain.       Right hand pain  Neurological: Positive for numbness and headaches.  Hematological: Does not bruise/bleed easily.  All other systems reviewed and are negative.  Allergies  Penicillins  Home Medications   Prior to Admission medications   Medication Sig Start Date End Date Taking? Authorizing Provider  albuterol (PROVENTIL HFA;VENTOLIN HFA) 108 (90 BASE) MCG/ACT inhaler Inhale 2 puffs into the lungs every 6 (six) hours as needed for wheezing or shortness of breath.    Historical Provider, MD  ibuprofen (ADVIL,MOTRIN) 800 MG tablet Take 1 tablet (800 mg total) by mouth 3 (three) times daily. 05/08/14   Gilda Crease, MD  Multiple Vitamin (MULTIVITAMIN WITH MINERALS) TABS tablet Take 1 tablet by mouth daily.    Historical Provider, MD  Multiple Vitamin (MULTIVITAMIN WITH MINERALS) TABS Take 1 tablet by mouth daily.    Historical Provider, MD  naproxen sodium (ALEVE) 220 MG tablet Take 220 mg by mouth 3 (three) times daily with meals.    Historical Provider, MD  naproxen sodium (ALEVE) 220 MG tablet Take 220 mg by mouth 2 (  two) times daily as needed (Pain).    Historical Provider, MD  Omega-3 Fatty Acids (FISH OIL) 1000 MG CAPS Take 2,000 mg by mouth daily.    Historical Provider, MD  traMADol (ULTRAM) 50 MG tablet Take 1 tablet (50 mg total) by mouth every 6 (six) hours as needed. 05/08/14   Gilda Crease, MD   Triage Vitals: BP 101/75 mmHg  Pulse 109  Temp(Src) 98.2 F (36.8 C) (Oral)  Resp 20  Ht  (1.702 m)  Wt 155 lb (70.308 kg)  BMI 24.27 kg/m2  SpO2 98% Physical Exam  Constitutional: He is oriented to person, place, and time. He appears well-developed  and well-nourished. No distress.  HENT:  Head: Normocephalic and atraumatic.  Eyes: Conjunctivae and EOM are normal.  Neck: Neck supple. Carotid bruit is not present. No tracheal deviation present.  Cardiovascular: Normal rate, regular rhythm and normal heart sounds.   Pulmonary/Chest: Effort normal. No respiratory distress.  Symmetric rise and fall of chest. Pt speaks in complete sentences.   Musculoskeletal: Normal range of motion.  Radial pulses 2+ bilaterally. Cap refill less than 2 seconds.  No effusion of knee bilaterally.  No deformity of tibial area.  Good ROM of bilateral hips, knees, and ankles   Lumbar Spine: No bruising of lumbar spine. Paraspinal area tenderness of upper lumbar spine. No palpable step offs. Some tenderness of the lower thoracic spine.    Neurological: He is alert and oriented to person, place, and time.  Gait is steady, no foot drop. No sensory deficit of LE.   Skin: Skin is warm and dry.  Well healed surgical scar in right thumb and wrist, no warmth appreciated in area.  Well healed surgical scar in lumbar spine. No palpable hematoma of lumbar spine.   Psychiatric: He has a normal mood and affect. His behavior is normal.  Nursing note and vitals reviewed.  ED Course  Procedures (including critical care time) DIAGNOSTIC STUDIES: Oxygen Saturation is 98% on RA, nl by my interpretation.    COORDINATION OF CARE: 6:58 PM-Discussed treatment plan which includes imaging of thoracic lumbar with pt at bedside and pt agreed to plan.   Labs Review Labs Reviewed - No data to display  Imaging Review No results found.   EKG Interpretation None      MDM  X-ray of the thoracic spine reveals thoracic spondylosis with moderate T12 compression deformity that was felt to be on chronic. There were other areas of mild vertebral body loss similar to the findings of a January 2014 x-ray finding. X-ray of the lumbar spine shows chronic changes at T12 and L4 with  postsurgical fixation present. It is of note that the left pedicle screw at L5 appears to be fractured. The chronicity of this screw change is uncertain.  There no gross neurologic deficits appreciated on today's examination. Patient was treated with Toradol and Robaxin in the emergency department. Prescription for Mobic 15 mg daily given to the patient. The patient left the emergency department in the care of law enforcement, and will be followed by the physician at his facility. The patient was invited to return if any emergent changes, problems, or concerns.    Final diagnoses:  None    **I have reviewed nursing notes, vital signs, and all appropriate lab and imaging results for this patient.*  **I personally performed the services described in this documentation, which was scribed in my presence. The recorded information has been reviewed and is accurate.Link Snuffer  Danae Orleans 04/08/15 1119  Glynn Octave, MD 04/08/15 1355

## 2015-04-06 NOTE — ED Notes (Signed)
Patient denying HI this Morning. [facility] staff notified and will have an extender reassess him this morning.

## 2015-04-06 NOTE — ED Notes (Signed)
Pt asked to sign consent for release of information to lisa, girlfriend.  Pt refused to sign stating "I'm not releasing information to her, she can go to hell."

## 2015-04-06 NOTE — ED Notes (Addendum)
Pt d/c from here, then arrested and taken to jail, says that he then fell off top bunk., landing on his back,  ? Loc, alert, says his rt hand hurts , and back.  Pt is a prisoner in cuffs with guard from jail.  Head hurts also.  No neck pain

## 2015-04-06 NOTE — ED Notes (Addendum)
Pts girlfriend called stating that she needs to talk to someone regarding pts care.  States that she has information about the patient threatening people and that she is afraid when the patient gets out of the hospital he is going to kill her.  Advised pt that if she feels threatened she may consider a protective order but that we cannot discuss patient information with her.  She also inquired as to how long we were going to "keep him locked up" advised that without pt's permission I could not discuss anything with her.  She states that she does not want pt to know that she called or that she is telling things about the pt.

## 2015-04-06 NOTE — ED Notes (Signed)
PT reports he fell off the top bunk bed in his cell and hit the toliet. PT c/o mid to lower back pain.
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
## 2015-04-30 ENCOUNTER — Encounter (HOSPITAL_COMMUNITY): Payer: Self-pay

## 2015-04-30 ENCOUNTER — Emergency Department (HOSPITAL_COMMUNITY): Payer: Self-pay

## 2015-04-30 ENCOUNTER — Emergency Department (HOSPITAL_COMMUNITY)
Admission: EM | Admit: 2015-04-30 | Discharge: 2015-04-30 | Discharge status: Against Medical Advice | Payer: Self-pay | Attending: Emergency Medicine | Admitting: Emergency Medicine

## 2015-04-30 DIAGNOSIS — S0990XA Unspecified injury of head, initial encounter: Secondary | ICD-10-CM

## 2015-04-30 DIAGNOSIS — W01198A Fall on same level from slipping, tripping and stumbling with subsequent striking against other object, initial encounter: Secondary | ICD-10-CM | POA: Insufficient documentation

## 2015-04-30 DIAGNOSIS — S3992XA Unspecified injury of lower back, initial encounter: Secondary | ICD-10-CM | POA: Insufficient documentation

## 2015-04-30 DIAGNOSIS — Z79899 Other long term (current) drug therapy: Secondary | ICD-10-CM | POA: Insufficient documentation

## 2015-04-30 DIAGNOSIS — S4991XA Unspecified injury of right shoulder and upper arm, initial encounter: Secondary | ICD-10-CM | POA: Insufficient documentation

## 2015-04-30 DIAGNOSIS — Z880 Allergy status to penicillin: Secondary | ICD-10-CM | POA: Insufficient documentation

## 2015-04-30 DIAGNOSIS — Z720 Tobacco use: Secondary | ICD-10-CM | POA: Insufficient documentation

## 2015-04-30 DIAGNOSIS — Y9389 Activity, other specified: Secondary | ICD-10-CM | POA: Insufficient documentation

## 2015-04-30 DIAGNOSIS — Y9289 Other specified places as the place of occurrence of the external cause: Secondary | ICD-10-CM | POA: Insufficient documentation

## 2015-04-30 DIAGNOSIS — W19XXXA Unspecified fall, initial encounter: Secondary | ICD-10-CM

## 2015-04-30 DIAGNOSIS — Z87442 Personal history of urinary calculi: Secondary | ICD-10-CM | POA: Insufficient documentation

## 2015-04-30 DIAGNOSIS — Y998 Other external cause status: Secondary | ICD-10-CM | POA: Insufficient documentation

## 2015-04-30 DIAGNOSIS — J45909 Unspecified asthma, uncomplicated: Secondary | ICD-10-CM | POA: Insufficient documentation

## 2015-04-30 DIAGNOSIS — M7989 Other specified soft tissue disorders: Secondary | ICD-10-CM

## 2015-04-30 DIAGNOSIS — R109 Unspecified abdominal pain: Secondary | ICD-10-CM | POA: Insufficient documentation

## 2015-04-30 DIAGNOSIS — M545 Low back pain, unspecified: Secondary | ICD-10-CM

## 2015-04-30 DIAGNOSIS — Z791 Long term (current) use of non-steroidal anti-inflammatories (NSAID): Secondary | ICD-10-CM | POA: Insufficient documentation

## 2015-04-30 HISTORY — DX: Calculus of kidney: N20.0

## 2015-04-30 LAB — CBC WITH DIFFERENTIAL/PLATELET
BASOS PCT: 0 % (ref 0–1)
Basophils Absolute: 0 10*3/uL (ref 0.0–0.1)
EOS PCT: 1 % (ref 0–5)
Eosinophils Absolute: 0.1 10*3/uL (ref 0.0–0.7)
HEMATOCRIT: 38.5 % — AB (ref 39.0–52.0)
Hemoglobin: 13.6 g/dL (ref 13.0–17.0)
Lymphocytes Relative: 13 % (ref 12–46)
Lymphs Abs: 1.5 10*3/uL (ref 0.7–4.0)
MCH: 30.5 pg (ref 26.0–34.0)
MCHC: 35.3 g/dL (ref 30.0–36.0)
MCV: 86.3 fL (ref 78.0–100.0)
MONO ABS: 1.4 10*3/uL — AB (ref 0.1–1.0)
MONOS PCT: 12 % (ref 3–12)
NEUTROS ABS: 8.5 10*3/uL — AB (ref 1.7–7.7)
Neutrophils Relative %: 74 % (ref 43–77)
Platelets: 278 10*3/uL (ref 150–400)
RBC: 4.46 MIL/uL (ref 4.22–5.81)
RDW: 13 % (ref 11.5–15.5)
WBC: 11.5 10*3/uL — ABNORMAL HIGH (ref 4.0–10.5)

## 2015-04-30 LAB — COMPREHENSIVE METABOLIC PANEL
ALK PHOS: 70 U/L (ref 38–126)
ALT: 17 U/L (ref 17–63)
AST: 19 U/L (ref 15–41)
Albumin: 4 g/dL (ref 3.5–5.0)
Anion gap: 10 (ref 5–15)
BUN: 13 mg/dL (ref 6–20)
CO2: 23 mmol/L (ref 22–32)
CREATININE: 0.79 mg/dL (ref 0.61–1.24)
Calcium: 8.6 mg/dL — ABNORMAL LOW (ref 8.9–10.3)
Chloride: 101 mmol/L (ref 101–111)
GFR calc non Af Amer: >60 mL/min (ref >60)
Glucose, Bld: 122 mg/dL — ABNORMAL HIGH (ref 65–99)
Potassium: 3.7 mmol/L (ref 3.5–5.1)
Sodium: 134 mmol/L — ABNORMAL LOW (ref 135–145)
TOTAL PROTEIN: 6.9 g/dL (ref 6.5–8.1)
Total Bilirubin: 1 mg/dL (ref 0.3–1.2)

## 2015-04-30 LAB — ETHANOL

## 2015-04-30 MED ORDER — SODIUM CHLORIDE 0.9 % IV BOLUS (SEPSIS): 1000.0000 mL | Freq: Once | INTRAVENOUS | Status: DC

## 2015-04-30 MED ORDER — KETOROLAC TROMETHAMINE 30 MG/ML IJ SOLN: 30.0000 mg | Freq: Once | INTRAMUSCULAR | Status: DC

## 2015-04-30 MED ORDER — ONDANSETRON HCL 4 MG/2ML IJ SOLN: 4.0000 mg | Freq: Once | INTRAMUSCULAR | Status: DC

## 2015-04-30 MED ORDER — TETANUS-DIPHTH-ACELL PERTUSSIS 5-2.5-18.5 LF-MCG/0.5 IM SUSP: 0.5000 mL | Freq: Once | INTRAMUSCULAR | Status: DC

## 2015-04-30 NOTE — ED Notes (Signed)
Pt initially here for right flank pain that radiates into his groin on the right, states history of kidney stones in the past.  Pt also states he fell down some steps on Saturday and injured his right arm, states he already has plates in his right hand

## 2015-04-30 NOTE — ED Notes (Signed)
Patient presented with multiple beer cans in pants pockets. Security contacted to inventory and secure contrabands. Patient refused to have items inventoried and secured and states "im just going to leave"  Patients IV removed at patient request, and patient ambulatory out of department at this time with a steady gait, cursing and yelling while walking out. EDP notified of patients wishes to leave.
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
## 2015-05-01 ENCOUNTER — Encounter (HOSPITAL_COMMUNITY): Payer: Self-pay | Admitting: Emergency Medicine

## 2015-05-01 ENCOUNTER — Emergency Department (HOSPITAL_COMMUNITY): Payer: Self-pay

## 2015-05-01 ENCOUNTER — Emergency Department (HOSPITAL_COMMUNITY)
Admission: EM | Admit: 2015-05-01 | Discharge: 2015-05-01 | Disposition: A | Discharge status: Home / Self Care | Payer: Self-pay | Attending: Emergency Medicine | Admitting: Emergency Medicine

## 2015-05-01 DIAGNOSIS — Z8659 Personal history of other mental and behavioral disorders: Secondary | ICD-10-CM | POA: Insufficient documentation

## 2015-05-01 DIAGNOSIS — Z880 Allergy status to penicillin: Secondary | ICD-10-CM | POA: Insufficient documentation

## 2015-05-01 DIAGNOSIS — S52501A Unspecified fracture of the lower end of right radius, initial encounter for closed fracture: Secondary | ICD-10-CM | POA: Insufficient documentation

## 2015-05-01 DIAGNOSIS — Z791 Long term (current) use of non-steroidal anti-inflammatories (NSAID): Secondary | ICD-10-CM | POA: Insufficient documentation

## 2015-05-01 DIAGNOSIS — W108XXA Fall (on) (from) other stairs and steps, initial encounter: Secondary | ICD-10-CM | POA: Insufficient documentation

## 2015-05-01 DIAGNOSIS — Z87442 Personal history of urinary calculi: Secondary | ICD-10-CM | POA: Insufficient documentation

## 2015-05-01 DIAGNOSIS — Y9289 Other specified places as the place of occurrence of the external cause: Secondary | ICD-10-CM | POA: Insufficient documentation

## 2015-05-01 DIAGNOSIS — Y9389 Activity, other specified: Secondary | ICD-10-CM | POA: Insufficient documentation

## 2015-05-01 DIAGNOSIS — F1911 Other psychoactive substance abuse, in remission: Secondary | ICD-10-CM

## 2015-05-01 DIAGNOSIS — Z79899 Other long term (current) drug therapy: Secondary | ICD-10-CM | POA: Insufficient documentation

## 2015-05-01 DIAGNOSIS — Y998 Other external cause status: Secondary | ICD-10-CM | POA: Insufficient documentation

## 2015-05-01 DIAGNOSIS — Z720 Tobacco use: Secondary | ICD-10-CM | POA: Insufficient documentation

## 2015-05-01 DIAGNOSIS — J45909 Unspecified asthma, uncomplicated: Secondary | ICD-10-CM | POA: Insufficient documentation

## 2015-05-01 MED ORDER — OXYCODONE-ACETAMINOPHEN 5-325 MG PO TABS
1.0000 | ORAL_TABLET | Freq: Once | ORAL | Status: AC
  Administered 2015-05-01: 1 via ORAL
  Filled 2015-05-01: qty 1

## 2015-05-01 MED ORDER — DOXYCYCLINE HYCLATE 100 MG PO CAPS: 100.0000 mg | ORAL_CAPSULE | Freq: Two times a day (BID) | ORAL | Status: DC

## 2015-05-01 MED ORDER — IBUPROFEN 600 MG PO TABS: 600.0000 mg | ORAL_TABLET | Freq: Four times a day (QID) | ORAL | Status: DC | PRN

## 2015-05-01 MED ORDER — TRAMADOL HCL 50 MG PO TABS: 50.0000 mg | ORAL_TABLET | Freq: Two times a day (BID) | ORAL | Status: DC | PRN

## 2015-05-01 NOTE — ED Notes (Signed)
Pt. Reports fall on Saturday injuring right hand. Pt. With swelling and redness to right hand and wrist.

## 2015-05-01 NOTE — Discharge Instructions (Signed)

## 2015-05-01 NOTE — ED Provider Notes (Signed)
CSN: [id number]     Arrival date & time 05/01/15  0630 History   First MD Initiated Contact with Patient 05/01/15 [phone]     Chief Complaint  Patient presents with  . Hand Injury     (Consider location/radiation/quality/duration/timing/severity/associated sxs/prior Treatment) Patient is a 49 y.o. male presenting with hand injury. The history is provided by the patient.  Hand Injury He fell down approximately 3 steps injuring his back and his right hand and forearm. This occurred 6 days ago. He has not done anything to treat it at home. His back is not bothering him much. He did hit his head but without loss of consciousness. His main complaint is his arm and hand. He rates pain at 10/10.  Past Medical History  Diagnosis Date  . Asthma     as child-now with allergy season only.  . Kidney calculi    Past Surgical History  Procedure Laterality Date  . Back surgery    . I&d extremity  10/24/2012    Procedure: IRRIGATION AND DEBRIDEMENT EXTREMITY;  Surgeon: Tami RibasKevin R Kuzma, MD;  [facility];  Service: Orthopedics;  Laterality: Right;  . Open reduction internal fixation (orif) hand Right thumb  . Hydrocele excision Left 08/01/2013    Procedure: HYDROCELECTOMY ADULT;  Surgeon: Ky BarbanMohammad I Javaid, MD;  Location: [facility];  Service: Urology;  Laterality: Left;  . Thumb fusion     No family history on file. History  Substance Use Topics  . Smoking status: Current Every Day Smoker -- 0.50 packs/day for 30 years    Types: Cigarettes  . Smokeless tobacco: Not on file  . Alcohol Use: 4.8 oz/week    8 Cans of beer per week     Comment: occ- 2 times weekly    Review of Systems  All other systems reviewed and are negative.     Allergies  Penicillins  Home Medications   Prior to Admission medications   Medication Sig Start Date End Date Taking? Authorizing Provider  albuterol (PROVENTIL HFA;VENTOLIN HFA) 108 (90 BASE) MCG/ACT inhaler Inhale 2 puffs into the lungs every 6 (six) hours as  needed for wheezing or shortness of breath.    Historical Provider, MD  meloxicam (MOBIC) 15 MG tablet Take 1 tablet (15 mg total) by mouth daily. 04/06/15   Ivery QualeHobson Bryant, PA-C  Multiple Vitamin (MULTIVITAMIN WITH MINERALS) TABS Take 1 tablet by mouth daily.    Historical Provider, MD  naproxen sodium (ALEVE) 220 MG tablet Take 220 mg by mouth 2 (two) times daily as needed (Pain).    Historical Provider, MD  Omega-3 Fatty Acids (FISH OIL) 1000 MG CAPS Take 2,000 mg by mouth daily.    Historical Provider, MD   BP 123/93 mmHg  Pulse 102  Temp(Src) 98.4 F (36.9 C) (Oral)  Resp 22  Ht 5\' 7"  (1.702 m)  Wt 145 lb (65.772 kg)  BMI 22.71 kg/m2  SpO2 100% Physical Exam  Nursing note and vitals reviewed.  49 year old male, resting comfortably and in no acute distress. Vital signs are significant for borderline hypertension and borderline tachycardia and borderline tachypnea. Oxygen saturation is 100%, which is normal. Head is normocephalic and atraumatic. PERRLA, EOMI. Oropharynx is clear. Neck is nontender and supple without adenopathy or JVD. Back is nontender and there is no CVA tenderness. Lungs are clear without rales, wheezes, or rhonchi. Chest is nontender. Heart has regular rate and rhythm without murmur. Abdomen is soft, flat, nontender without masses or hepatosplenomegaly and peristalsis is  normoactive. Extremities: There is moderate swelling of the right forearm, wrist, hand with tenderness rather diffusely. Neurovascular exam is intact with normal sensation and prompt capillary refill. Pulses are difficult to detect because of swelling. Skin is warm and dry without rash. Neurologic: Mental status is normal, cranial nerves are intact, there are no motor or sensory deficits.  ED Course  Procedures (including critical care time)   MDM   Final diagnoses:  Distal radius fracture, right, closed, initial encounter  History of substance abuse    Fall 6 days ago with injury to  right forearm and hand. He will be sent for x-rays. Old records are reviewed and he was allergy seen in the ED yesterday but left AGAINST MEDICAL ADVICE when he was found to have illegal drugs and beer with him.  Case is signed out to Dr. Bebe Shaggy to review x-ray results.  Dione Booze, MD 05/01/15 2258

## 2015-05-01 NOTE — ED Provider Notes (Signed)
Pt with distal radius fx He is neurovascularly intact He has erythema to distal forearm concern for possible cellulitis He needs splinting of fracture Will also start doxycycline for possible cellulitis Tramadol/ibuprofen given for pain Advised to f/u with dr Hilda Liaskeeling (his preference)   Zadie Rhineonald Sydny Schnitzler, MD 05/01/15 [phone]

## 2015-05-01 NOTE — ED Provider Notes (Signed)
SPLINT APPLICATION Date/Time: 8:15 AM Authorized by: Joya GaskinsWICKLINE,Dima Ferrufino W Consent: Verbal consent obtained. Risks and benefits: risks, benefits and alternatives were discussed Consent given by: patient Splint applied by: nurse Location details: right arm Splint type: sugartong Supplies used: fiberglass Post-procedure: The splinted body part was neurovascularly unchanged following the procedure. Patient tolerance: Patient tolerated the procedure well with no immediate complications.     Zadie Rhineonald Shalae Belmonte, MD 05/01/15 [phone]

## 2015-05-10 ENCOUNTER — Emergency Department (HOSPITAL_COMMUNITY)
Admission: EM | Admit: 2015-05-10 | Discharge: 2015-05-10 | Discharge status: Against Medical Advice | Payer: Self-pay | Attending: Emergency Medicine | Admitting: Emergency Medicine

## 2015-05-10 DIAGNOSIS — Y9389 Activity, other specified: Secondary | ICD-10-CM | POA: Insufficient documentation

## 2015-05-10 DIAGNOSIS — Y998 Other external cause status: Secondary | ICD-10-CM | POA: Insufficient documentation

## 2015-05-10 DIAGNOSIS — Y9289 Other specified places as the place of occurrence of the external cause: Secondary | ICD-10-CM | POA: Insufficient documentation

## 2015-05-10 DIAGNOSIS — Z720 Tobacco use: Secondary | ICD-10-CM | POA: Insufficient documentation

## 2015-05-10 DIAGNOSIS — X58XXXA Exposure to other specified factors, initial encounter: Secondary | ICD-10-CM | POA: Insufficient documentation

## 2015-05-10 DIAGNOSIS — S61217A Laceration without foreign body of left little finger without damage to nail, initial encounter: Secondary | ICD-10-CM | POA: Insufficient documentation

## 2015-05-10 DIAGNOSIS — S4991XA Unspecified injury of right shoulder and upper arm, initial encounter: Secondary | ICD-10-CM | POA: Insufficient documentation

## 2015-05-10 DIAGNOSIS — J45909 Unspecified asthma, uncomplicated: Secondary | ICD-10-CM | POA: Insufficient documentation
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
## 2015-05-10 NOTE — ED Notes (Signed)
Patient cursing at staff, security, and [employer] officer. Patient escorted out of department by security and [employer] after stating he did not want to be seen anymore.

## 2015-05-11 ENCOUNTER — Emergency Department (HOSPITAL_COMMUNITY)

## 2015-05-11 ENCOUNTER — Encounter (HOSPITAL_COMMUNITY): Payer: Self-pay | Admitting: *Deleted

## 2015-05-11 ENCOUNTER — Emergency Department (HOSPITAL_COMMUNITY)
Admission: EM | Admit: 2015-05-11 | Discharge: 2015-05-12 | Disposition: A | Discharge status: Home / Self Care | Attending: Emergency Medicine | Admitting: Emergency Medicine

## 2015-05-11 DIAGNOSIS — Z23 Encounter for immunization: Secondary | ICD-10-CM | POA: Insufficient documentation

## 2015-05-11 DIAGNOSIS — Z79899 Other long term (current) drug therapy: Secondary | ICD-10-CM | POA: Insufficient documentation

## 2015-05-11 DIAGNOSIS — Z880 Allergy status to penicillin: Secondary | ICD-10-CM | POA: Insufficient documentation

## 2015-05-11 DIAGNOSIS — S61217A Laceration without foreign body of left little finger without damage to nail, initial encounter: Secondary | ICD-10-CM | POA: Insufficient documentation

## 2015-05-11 DIAGNOSIS — Y9389 Activity, other specified: Secondary | ICD-10-CM | POA: Insufficient documentation

## 2015-05-11 DIAGNOSIS — S62627B Displaced fracture of medial phalanx of left little finger, initial encounter for open fracture: Secondary | ICD-10-CM | POA: Insufficient documentation

## 2015-05-11 DIAGNOSIS — Y998 Other external cause status: Secondary | ICD-10-CM | POA: Insufficient documentation

## 2015-05-11 DIAGNOSIS — S52501D Unspecified fracture of the lower end of right radius, subsequent encounter for closed fracture with routine healing: Secondary | ICD-10-CM

## 2015-05-11 DIAGNOSIS — Z87442 Personal history of urinary calculi: Secondary | ICD-10-CM | POA: Insufficient documentation

## 2015-05-11 DIAGNOSIS — Z791 Long term (current) use of non-steroidal anti-inflammatories (NSAID): Secondary | ICD-10-CM | POA: Insufficient documentation

## 2015-05-11 DIAGNOSIS — Y929 Unspecified place or not applicable: Secondary | ICD-10-CM | POA: Insufficient documentation

## 2015-05-11 DIAGNOSIS — S52591D Other fractures of lower end of right radius, subsequent encounter for closed fracture with routine healing: Secondary | ICD-10-CM | POA: Insufficient documentation

## 2015-05-11 DIAGNOSIS — J45909 Unspecified asthma, uncomplicated: Secondary | ICD-10-CM | POA: Insufficient documentation

## 2015-05-11 DIAGNOSIS — S62607B Fracture of unspecified phalanx of left little finger, initial encounter for open fracture: Secondary | ICD-10-CM

## 2015-05-11 DIAGNOSIS — Z720 Tobacco use: Secondary | ICD-10-CM | POA: Insufficient documentation

## 2015-05-11 MED ORDER — TETANUS-DIPHTH-ACELL PERTUSSIS 5-2.5-18.5 LF-MCG/0.5 IM SUSP
0.5000 mL | Freq: Once | INTRAMUSCULAR | Status: AC
Start: 2015-05-12 — End: 2015-05-12
  Administered 2015-05-12: 0.5 mL via INTRAMUSCULAR
  Filled 2015-05-11: qty 0.5
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
## 2015-05-12 ENCOUNTER — Other Ambulatory Visit: Payer: Self-pay | Admitting: Orthopedic Surgery

## 2015-05-12 MED ORDER — IBUPROFEN 800 MG PO TABS
800.0000 mg | ORAL_TABLET | Freq: Once | ORAL | Status: AC
  Administered 2015-05-12: 800 mg via ORAL

## 2015-05-12 MED ORDER — SULFAMETHOXAZOLE-TRIMETHOPRIM 800-160 MG PO TABS: 1.0000 | ORAL_TABLET | Freq: Two times a day (BID) | ORAL | Status: AC

## 2015-05-12 MED ORDER — SULFAMETHOXAZOLE-TRIMETHOPRIM 800-160 MG PO TABS
1.0000 | ORAL_TABLET | Freq: Once | ORAL | Status: AC
  Administered 2015-05-12: 1 via ORAL
  Filled 2015-05-12: qty 1

## 2015-05-12 MED ORDER — IBUPROFEN 800 MG PO TABS
ORAL_TABLET | ORAL | Status: AC
  Filled 2015-05-12: qty 1

## 2015-05-12 MED ORDER — BACITRACIN ZINC 500 UNIT/GM EX OINT
TOPICAL_OINTMENT | CUTANEOUS | Status: AC
  Administered 2015-05-12: 01:00:00
  Filled 2015-05-12: qty 0.9

## 2015-05-12 MED ORDER — OXYCODONE-ACETAMINOPHEN 5-325 MG PO TABS
1.0000 | ORAL_TABLET | Freq: Once | ORAL | Status: AC
  Administered 2015-05-12: 1 via ORAL
  Filled 2015-05-12: qty 1

## 2015-05-12 MED ORDER — IBUPROFEN 800 MG PO TABS: 800.0000 mg | ORAL_TABLET | Freq: Three times a day (TID) | ORAL | Status: DC | PRN

## 2015-05-12 NOTE — ED Notes (Signed)
Report given to detention department Sergeant.
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
## 2015-05-12 NOTE — Discharge Instructions (Signed)
Cast or Splint Care °Casts and splints support injured limbs and keep bones from moving while they heal. It is important to care for your cast or splint at home.   °HOME CARE INSTRUCTIONS °· Keep the cast or splint uncovered during the drying period. It can take 24 to 48 hours to dry if it is made of plaster. A fiberglass cast will dry in less than 1 hour. °· Do not rest the cast on anything harder than a pillow for the first 24 hours. °· Do not put weight on your injured limb or apply pressure to the cast until your health care provider gives you permission. °· Keep the cast or splint dry. Wet casts or splints can lose their shape and may not support the limb as well. A wet cast that has lost its shape can also create harmful pressure on your skin when it dries. Also, wet skin can become infected. °¨ Cover the cast or splint with a plastic bag when bathing or when out in the rain or snow. If the cast is on the trunk of the body, take sponge baths until the cast is removed. °¨ If your cast does become wet, dry it with a towel or a blow dryer on the cool setting only. °· Keep your cast or splint clean. Soiled casts may be wiped with a moistened cloth. °· Do not place any hard or soft foreign objects under your cast or splint, such as cotton, toilet paper, lotion, or powder. °· Do not try to scratch the skin under the cast with any object. The object could get stuck inside the cast. Also, scratching could lead to an infection. If itching is a problem, use a blow dryer on a cool setting to relieve discomfort. °· Do not trim or cut your cast or remove padding from inside of it. °· Exercise all joints next to the injury that are not immobilized by the cast or splint. For example, if you have a long leg cast, exercise the hip joint and toes. If you have an arm cast or splint, exercise the shoulder, elbow, thumb, and fingers. °· Elevate your injured arm or leg on 1 or 2 pillows for the first 1 to 3 days to decrease  swelling and pain. It is best if you can comfortably elevate your cast so it is higher than your heart. °SEEK MEDICAL CARE IF:  °· Your cast or splint cracks. °· Your cast or splint is too tight or too loose. °· You have unbearable itching inside the cast. °· Your cast becomes wet or develops a soft spot or area. °· You have a bad smell coming from inside your cast. °· You get an object stuck under your cast. °· Your skin around the cast becomes red or raw. °· You have new pain or worsening pain after the cast has been applied. °SEEK IMMEDIATE MEDICAL CARE IF:  °· You have fluid leaking through the cast. °· You are unable to move your fingers or toes. °· You have discolored (blue or white), cool, painful, or very swollen fingers or toes beyond the cast. °· You have tingling or numbness around the injured area. °· You have severe pain or pressure under the cast. °· You have any difficulty with your breathing or have shortness of breath. °· You have chest pain. °Document Released: 09/30/2000 Document Revised: 07/24/2013 Document Reviewed: 04/11/2013 °ExitCare® Patient Information ©2015 [employer]. This information is not intended to replace advice given to you by your health care   provider. Make sure you discuss any questions you have with your health care provider.  Delayed Wound Closure Sometimes, your health care provider will decide to delay closing a wound for several days. This is done when the wound is badly bruised, dirty, or when it has been several hours since the injury happened. By delaying the closure of your wound, the risk of infection is reduced. Wounds that are closed in 3-7 days after being cleaned up and dressed heal just as well as those that are closed right away. HOME CARE INSTRUCTIONS  Rest and elevate the injured area until the pain and swelling are gone.  Have your wound checked as instructed by your health care provider. SEEK MEDICAL CARE IF:  You develop unusual or increased  swelling or redness around the wound.  You have increasing pain or tenderness.  There is increasing fluid (drainage) or a bad smelling drainage coming from the wound. Document Released: 10/03/2005 Document Revised: 10/08/2013 Document Reviewed: 04/02/2013 [facility] Patient Information 2015 [place], [place]. This information is not intended to replace advice given to you by your health care provider. Make sure you discuss any questions you have with your health care provider.  Finger Fracture Fractures of fingers are breaks in the bones of the fingers. There are many types of fractures. There are different ways of treating these fractures. Your health care provider will discuss the best way to treat your fracture. CAUSES Traumatic injury is the main cause of broken fingers. These include:  Injuries while playing sports.  Workplace injuries.  Falls. RISK FACTORS Activities that can increase your risk of finger fractures include:  Sports.  Workplace activities that involve machinery.  A condition called osteoporosis, which can make your bones less dense and cause them to fracture more easily. SIGNS AND SYMPTOMS The main symptoms of a broken finger are pain and swelling within 15 minutes after the injury. Other symptoms include:  Bruising of your finger.  Stiffness of your finger.  Numbness of your finger.  Exposed bones (compound fracture) if the fracture is severe. DIAGNOSIS  The best way to diagnose a broken bone is with X-ray imaging. Additionally, your health care provider will use this X-ray image to evaluate the position of the broken finger bones.  TREATMENT  Finger fractures can be treated with:   Nonreduction--This means the bones are in place. The finger is splinted without changing the positions of the bone pieces. The splint is usually left on for about a week to 10 days. This will depend on your fracture and what your health care provider thinks.  Closed  reduction--The bones are put back into position without using surgery. The finger is then splinted.  Open reduction and internal fixation--The fracture site is opened. Then the bone pieces are fixed into place with pins or some type of hardware. This is seldom required. It depends on the severity of the fracture. HOME CARE INSTRUCTIONS   Follow your health care provider's instructions regarding activities, exercises, and physical therapy.  Only take over-the-counter or prescription medicines for pain, discomfort, or fever as directed by your health care provider. SEEK MEDICAL CARE IF: You have pain or swelling that limits the motion or use of your fingers. SEEK IMMEDIATE MEDICAL CARE IF:  Your finger becomes numb. MAKE SURE YOU:   Understand these instructions.  Will watch your condition.  Will get help right away if you are not doing well or get worse. Document Released: 01/15/2001 Document Revised: 07/24/2013 Document Reviewed: 05/15/2013 ExitCare Patient  Information 2015 [place], [place]. This information is not intended to replace advice given to you by your health care provider. Make sure you discuss any questions you have with your health care provider.   Radial Fracture You have a broken bone (fracture) of the forearm. This is the part of your arm between the elbow and your wrist. Your forearm is made up of two bones. These are the radius and ulna. Your fracture is in the radial shaft. This is the bone in your forearm located on the thumb side. A cast or splint is used to protect and keep your injured bone from moving. The cast or splint will be on generally for about 5 to 6 weeks, with individual variations. HOME CARE INSTRUCTIONS   Keep the injured part elevated while sitting or lying down. Keep the injury above the level of your heart (the center of the chest). This will decrease swelling and pain.  Apply ice to the injury for 15-20 minutes, 03-04 times per day while awake, for  2 days. Put the ice in a plastic bag and place a towel between the bag of ice and your cast or splint.  Move your fingers to avoid stiffness and minimize swelling.  If you have a plaster or fiberglass cast:  Do not try to scratch the skin under the cast using sharp or pointed objects.  Check the skin around the cast every day. You may put lotion on any red or sore areas.  Keep your cast dry and clean.  If you have a plaster splint:  Wear the splint as directed.  You may loosen the elastic around the splint if your fingers become numb, tingle, or turn cold or blue.  Do not put pressure on any part of your cast or splint. It may break. Rest your cast only on a pillow for the first 24 hours until it is fully hardened.  Your cast or splint can be protected during bathing with a plastic bag. Do not lower the cast or splint into water.  Only take over-the-counter or prescription medicines for pain, discomfort, or fever as directed by your caregiver. SEEK IMMEDIATE MEDICAL CARE IF:   Your cast gets damaged or breaks.  You have more severe pain or swelling than you did before getting the cast.  You have severe pain when stretching your fingers.  There is a bad smell, new stains and/or pus-like (purulent) drainage coming from under the cast.  Your fingers or hand turn pale or blue and become cold or your loose feeling. Document Released: 03/16/2006 Document Revised: 12/26/2011 Document Reviewed: 06/12/2006 [facility] Patient Information 2015 [place], [place]. This information is not intended to replace advice given to you by your health care provider. Make sure you discuss any questions you have with your health care provider.

## 2015-05-14 NOTE — Progress Notes (Signed)
Spoke with Dawn, RN to give pre-op instructions only for pt; please complete pt assessment on DOS. RN made aware to stop administering  Asa, otc vitamins and herbal medications. According to Sarah at MD's office, PA stated that pt can continue taking NSAID's. Spoke with Helmut Muster, surgical scheduler, for clarification of "P-2" written on order for consent; okay to write middle phalanx instead of P-2.

## 2015-05-15 ENCOUNTER — Other Ambulatory Visit: Payer: Self-pay | Admitting: Orthopedic Surgery

## 2015-05-15 MED ORDER — VANCOMYCIN HCL IN DEXTROSE 1-5 GM/200ML-% IV SOLN
1000.0000 mg | INTRAVENOUS | Status: AC
  Administered 2015-05-16: 1000 mg via INTRAVENOUS
  Filled 2015-05-15: qty 200

## 2015-05-16 ENCOUNTER — Ambulatory Visit (HOSPITAL_COMMUNITY): Payer: Self-pay | Admitting: Anesthesiology

## 2015-05-16 ENCOUNTER — Ambulatory Visit (HOSPITAL_COMMUNITY)
Admission: RE | Admit: 2015-05-16 | Discharge: 2015-05-16 | Disposition: A | Discharge status: Home / Self Care | Payer: Self-pay | Source: Ambulatory Visit | Attending: Orthopedic Surgery | Admitting: Orthopedic Surgery

## 2015-05-16 ENCOUNTER — Encounter (HOSPITAL_COMMUNITY)
Admission: RE | Disposition: A | Discharge status: Home / Self Care | Payer: Self-pay | Source: Ambulatory Visit | Attending: Orthopedic Surgery

## 2015-05-16 ENCOUNTER — Encounter (HOSPITAL_COMMUNITY): Payer: Self-pay | Admitting: *Deleted

## 2015-05-16 DIAGNOSIS — Z880 Allergy status to penicillin: Secondary | ICD-10-CM | POA: Insufficient documentation

## 2015-05-16 DIAGNOSIS — S52571A Other intraarticular fracture of lower end of right radius, initial encounter for closed fracture: Secondary | ICD-10-CM | POA: Insufficient documentation

## 2015-05-16 DIAGNOSIS — F17210 Nicotine dependence, cigarettes, uncomplicated: Secondary | ICD-10-CM | POA: Insufficient documentation

## 2015-05-16 DIAGNOSIS — S6401XA Injury of ulnar nerve at wrist and hand level of right arm, initial encounter: Secondary | ICD-10-CM | POA: Insufficient documentation

## 2015-05-16 DIAGNOSIS — S62627A Displaced fracture of medial phalanx of left little finger, initial encounter for closed fracture: Secondary | ICD-10-CM | POA: Insufficient documentation

## 2015-05-16 HISTORY — PX: OPEN REDUCTION INTERNAL FIXATION (ORIF) METACARPAL: SHX6234

## 2015-05-16 HISTORY — PX: OPEN REDUCTION INTERNAL FIXATION (ORIF) DISTAL RADIAL FRACTURE: SHX5989

## 2015-05-16 SURGERY — OPEN REDUCTION INTERNAL FIXATION (ORIF) DISTAL RADIUS FRACTURE
Anesthesia: General | Site: Wrist | Laterality: Right

## 2015-05-16 MED ORDER — BUPIVACAINE HCL (PF) 0.25 % IJ SOLN
INTRAMUSCULAR | Status: DC | PRN
  Administered 2015-05-16 (×2): 8 mL

## 2015-05-16 MED ORDER — OXYCODONE HCL 5 MG PO TABS
ORAL_TABLET | ORAL | Status: AC
  Filled 2015-05-16: qty 1

## 2015-05-16 MED ORDER — FENTANYL CITRATE (PF) 250 MCG/5ML IJ SOLN
INTRAMUSCULAR | Status: AC
  Filled 2015-05-16: qty 5

## 2015-05-16 MED ORDER — HYDROMORPHONE HCL 1 MG/ML IJ SOLN: 0.2500 mg | INTRAMUSCULAR | Status: DC | PRN

## 2015-05-16 MED ORDER — BUPIVACAINE HCL (PF) 0.25 % IJ SOLN
INTRAMUSCULAR | Status: AC
  Filled 2015-05-16: qty 30

## 2015-05-16 MED ORDER — LIDOCAINE HCL (CARDIAC) 20 MG/ML IV SOLN
INTRAVENOUS | Status: DC | PRN
  Administered 2015-05-16: 100 mg via INTRAVENOUS

## 2015-05-16 MED ORDER — 0.9 % SODIUM CHLORIDE (POUR BTL) OPTIME
TOPICAL | Status: DC | PRN
  Administered 2015-05-16: 1000 mL

## 2015-05-16 MED ORDER — ONDANSETRON HCL 4 MG/2ML IJ SOLN
INTRAMUSCULAR | Status: AC
  Filled 2015-05-16: qty 2

## 2015-05-16 MED ORDER — FENTANYL CITRATE (PF) 100 MCG/2ML IJ SOLN
INTRAMUSCULAR | Status: DC | PRN
  Administered 2015-05-16 (×2): 50 ug via INTRAVENOUS
  Administered 2015-05-16: 100 ug via INTRAVENOUS
  Administered 2015-05-16: 50 ug via INTRAVENOUS
  Administered 2015-05-16: 100 ug via INTRAVENOUS
  Administered 2015-05-16: 50 ug via INTRAVENOUS

## 2015-05-16 MED ORDER — ROCURONIUM BROMIDE 100 MG/10ML IV SOLN: INTRAVENOUS | Status: DC | PRN

## 2015-05-16 MED ORDER — PHENYLEPHRINE HCL 10 MG/ML IJ SOLN
INTRAMUSCULAR | Status: DC | PRN
  Administered 2015-05-16 (×2): 80 ug via INTRAVENOUS

## 2015-05-16 MED ORDER — OXYCODONE HCL 5 MG/5ML PO SOLN: 5.0000 mg | Freq: Once | ORAL | Status: AC | PRN

## 2015-05-16 MED ORDER — GLYCOPYRROLATE 0.2 MG/ML IJ SOLN
INTRAMUSCULAR | Status: DC | PRN
  Administered 2015-05-16: 0.2 mg via INTRAVENOUS

## 2015-05-16 MED ORDER — OXYCODONE HCL 5 MG PO TABS
5.0000 mg | ORAL_TABLET | Freq: Once | ORAL | Status: AC | PRN
  Administered 2015-05-16: 5 mg via ORAL

## 2015-05-16 MED ORDER — PROPOFOL 10 MG/ML IV BOLUS
INTRAVENOUS | Status: AC
  Filled 2015-05-16: qty 20

## 2015-05-16 MED ORDER — PROMETHAZINE HCL 25 MG/ML IJ SOLN: 6.2500 mg | INTRAMUSCULAR | Status: DC | PRN

## 2015-05-16 MED ORDER — HYDROMORPHONE HCL 1 MG/ML IJ SOLN
0.2500 mg | INTRAMUSCULAR | Status: DC | PRN
  Administered 2015-05-16 (×4): 0.5 mg via INTRAVENOUS

## 2015-05-16 MED ORDER — MIDAZOLAM HCL 2 MG/2ML IJ SOLN
INTRAMUSCULAR | Status: AC
  Filled 2015-05-16: qty 2

## 2015-05-16 MED ORDER — HYDROMORPHONE HCL 1 MG/ML IJ SOLN
INTRAMUSCULAR | Status: AC
  Administered 2015-05-16: 0.5 mg via INTRAVENOUS
  Filled 2015-05-16: qty 2

## 2015-05-16 MED ORDER — EPHEDRINE SULFATE 50 MG/ML IJ SOLN
INTRAMUSCULAR | Status: DC | PRN
  Administered 2015-05-16: 20 mg via INTRAVENOUS
  Administered 2015-05-16: 5 mg via INTRAVENOUS

## 2015-05-16 MED ORDER — LACTATED RINGERS IV SOLN
INTRAVENOUS | Status: DC | PRN
  Administered 2015-05-16 (×2): via INTRAVENOUS

## 2015-05-16 MED ORDER — CHLORHEXIDINE GLUCONATE 4 % EX LIQD: 60.0000 mL | Freq: Once | CUTANEOUS | Status: DC

## 2015-05-16 MED ORDER — MEPERIDINE HCL 25 MG/ML IJ SOLN: 6.2500 mg | INTRAMUSCULAR | Status: DC | PRN

## 2015-05-16 MED ORDER — ONDANSETRON HCL 4 MG/2ML IJ SOLN
INTRAMUSCULAR | Status: DC | PRN
  Administered 2015-05-16: 4 mg via INTRAVENOUS

## 2015-05-16 MED ORDER — PHENYLEPHRINE 40 MCG/ML (10ML) SYRINGE FOR IV PUSH (FOR BLOOD PRESSURE SUPPORT)
PREFILLED_SYRINGE | INTRAVENOUS | Status: AC
  Filled 2015-05-16: qty 10

## 2015-05-16 MED ORDER — KETOROLAC TROMETHAMINE 30 MG/ML IJ SOLN: 30.0000 mg | Freq: Once | INTRAMUSCULAR | Status: DC | PRN

## 2015-05-16 MED ORDER — PROPOFOL 10 MG/ML IV BOLUS
INTRAVENOUS | Status: DC | PRN
  Administered 2015-05-16: 200 mg via INTRAVENOUS

## 2015-05-16 MED ORDER — MIDAZOLAM HCL 5 MG/5ML IJ SOLN
INTRAMUSCULAR | Status: DC | PRN
  Administered 2015-05-16 (×2): 2 mg via INTRAVENOUS

## 2015-05-16 SURGICAL SUPPLY — 84 items
APL SKNCLS STERI-STRIP NONHPOA (GAUZE/BANDAGES/DRESSINGS) ×2
BANDAGE ELASTIC 3 VELCRO ST LF (GAUZE/BANDAGES/DRESSINGS) ×8 IMPLANT
BANDAGE ELASTIC 4 VELCRO ST LF (GAUZE/BANDAGES/DRESSINGS) ×4 IMPLANT
BATTERY INDUSTRIAL AA (BATTERY) ×4 IMPLANT
BENZOIN TINCTURE PRP APPL 2/3 (GAUZE/BANDAGES/DRESSINGS) ×4 IMPLANT
BIT DRILL 1.0 (BIT) ×3
BIT DRILL 1.0MM (BIT) ×1
BIT DRILL 1.0X50 (BIT) ×2 IMPLANT
BIT DRILL 2 FAST STEP (BIT) ×4 IMPLANT
BIT DRILL 2.5X4 QC (BIT) ×4 IMPLANT
BNDG CMPR 9X4 STRL LF SNTH (GAUZE/BANDAGES/DRESSINGS) ×4
BNDG CMPR MD 5X2 ELC HKLP STRL (GAUZE/BANDAGES/DRESSINGS) ×2
BNDG ELASTIC 2 VLCR STRL LF (GAUZE/BANDAGES/DRESSINGS) ×4 IMPLANT
BNDG ESMARK 4X9 LF (GAUZE/BANDAGES/DRESSINGS) ×8 IMPLANT
BNDG GAUZE ELAST 4 BULKY (GAUZE/BANDAGES/DRESSINGS) ×8 IMPLANT
BTRY ALK AA CELL 9V MR FR (BATTERY) ×2
CAST PADDING SYN 3 (CAST SUPPLIES) ×4 IMPLANT
CORDS BIPOLAR (ELECTRODE) ×4 IMPLANT
COVER SURGICAL LIGHT HANDLE (MISCELLANEOUS) ×4 IMPLANT
CUFF TOURNIQUET SINGLE 18IN (TOURNIQUET CUFF) ×4 IMPLANT
CUFF TOURNIQUET SINGLE 24IN (TOURNIQUET CUFF) IMPLANT
DRAPE C-ARM MINI 42X72 WSTRAPS (DRAPES) ×4 IMPLANT
DRAPE OEC MINIVIEW 54X84 (DRAPES) IMPLANT
DRAPE PROXIMA HALF (DRAPES) ×4 IMPLANT
DRAPE SURG 17X23 STRL (DRAPES) ×4 IMPLANT
DURAPREP 26ML APPLICATOR (WOUND CARE) ×4 IMPLANT
ELECT REM PT RETURN 9FT ADLT (ELECTROSURGICAL)
ELECTRODE REM PT RTRN 9FT ADLT (ELECTROSURGICAL) IMPLANT
GAUZE SPONGE 4X4 12PLY STRL (GAUZE/BANDAGES/DRESSINGS) ×4 IMPLANT
GAUZE XEROFORM 1X8 LF (GAUZE/BANDAGES/DRESSINGS) ×8 IMPLANT
GLOVE SURG SYN 8.0 (GLOVE) ×4 IMPLANT
GOWN STRL REUS W/ TWL LRG LVL3 (GOWN DISPOSABLE) ×2 IMPLANT
GOWN STRL REUS W/ TWL XL LVL3 (GOWN DISPOSABLE) ×2 IMPLANT
GOWN STRL REUS W/TWL LRG LVL3 (GOWN DISPOSABLE) ×4
GOWN STRL REUS W/TWL XL LVL3 (GOWN DISPOSABLE) ×4
KIT BASIN OR (CUSTOM PROCEDURE TRAY) ×4 IMPLANT
KIT ROOM TURNOVER OR (KITS) ×4 IMPLANT
MANIFOLD NEPTUNE II (INSTRUMENTS) ×4 IMPLANT
NEEDLE HYPO 25GX1X1/2 BEV (NEEDLE) IMPLANT
NS IRRIG 1000ML POUR BTL (IV SOLUTION) ×4 IMPLANT
PACK ORTHO EXTREMITY (CUSTOM PROCEDURE TRAY) ×4 IMPLANT
PAD ARMBOARD 7.5X6 YLW CONV (MISCELLANEOUS) ×8 IMPLANT
PAD CAST 3X4 CTTN HI CHSV (CAST SUPPLIES) ×2 IMPLANT
PAD CAST 4YDX4 CTTN HI CHSV (CAST SUPPLIES) ×2 IMPLANT
PADDING CAST COTTON 3X4 STRL (CAST SUPPLIES) ×4
PADDING CAST COTTON 4X4 STRL (CAST SUPPLIES) ×4
PADDING CAST SYNTHETIC 2 (CAST SUPPLIES) ×2
PADDING CAST SYNTHETIC 2X4 NS (CAST SUPPLIES) ×2 IMPLANT
PEG SUBCHONDRAL SMOOTH 2.0X22 (Peg) ×8 IMPLANT
PEG SUBCHONDRAL SMOOTH 2.0X24 (Peg) ×20 IMPLANT
PENCIL BUTTON HOLSTER BLD 10FT (ELECTRODE) IMPLANT
PLATE STAN 24.4X59.5 RT (Plate) ×4 IMPLANT
PUTTY DBM STAGRAFT PLUS 2CC (Putty) ×4 IMPLANT
SCREW BN 12X3.5XNS CORT TI (Screw) ×2 IMPLANT
SCREW BN 13X3.5XNS CORT TI (Screw) ×4 IMPLANT
SCREW CORT 3.5X12 (Screw) ×4 IMPLANT
SCREW CORT 3.5X13 (Screw) ×8 IMPLANT
SCREW SELF TAP CORTEX 1.0 7MM (Screw) ×4 IMPLANT
SCREW SELF TAP CORTEX 1.0 9MM (Screw) ×4 IMPLANT
SPLINT PLASTER CAST XFAST 5X30 (CAST SUPPLIES) ×2 IMPLANT
SPLINT PLASTER EXTRA FAST 3X15 (CAST SUPPLIES) ×2
SPLINT PLASTER GYPS XFAST 3X15 (CAST SUPPLIES) ×2 IMPLANT
SPLINT PLASTER XFAST SET 5X30 (CAST SUPPLIES) ×2
SPONGE GAUZE 4X4 12PLY STER LF (GAUZE/BANDAGES/DRESSINGS) ×8 IMPLANT
SPONGE LAP 4X18 X RAY DECT (DISPOSABLE) IMPLANT
SPONGE SCRUB IODOPHOR (GAUZE/BANDAGES/DRESSINGS) ×4 IMPLANT
SUT FIBERWIRE 4-0 18 TAPR NDL (SUTURE) ×4
SUT NYLON 9 0 VRM6 (SUTURE) ×4 IMPLANT
SUT PROLENE 3 0 PS 2 (SUTURE) ×4 IMPLANT
SUT VIC AB 2-0 CT1 27 (SUTURE) ×4
SUT VIC AB 2-0 CT1 TAPERPNT 27 (SUTURE) ×2 IMPLANT
SUT VIC AB 2-0 FS1 27 (SUTURE) ×4 IMPLANT
SUT VIC AB 3-0 FS2 27 (SUTURE) IMPLANT
SUT VIC AB 4-0 PS2 27 (SUTURE) ×4 IMPLANT
SUT VICRYL 4-0 PS2 18IN ABS (SUTURE) ×4 IMPLANT
SUTURE FIBERWR 4-0 18 TAPR NDL (SUTURE) ×2 IMPLANT
SYR CONTROL 10ML LL (SYRINGE) IMPLANT
TAPE STRIPS DRAPE STRL (GAUZE/BANDAGES/DRESSINGS) ×4 IMPLANT
TOWEL OR 17X24 6PK STRL BLUE (TOWEL DISPOSABLE) ×4 IMPLANT
TOWEL OR 17X26 10 PK STRL BLUE (TOWEL DISPOSABLE) ×4 IMPLANT
TUBE CONNECTING 12'X1/4 (SUCTIONS) ×1
TUBE CONNECTING 12X1/4 (SUCTIONS) ×3 IMPLANT
UNDERPAD 30X30 INCONTINENT (UNDERPADS AND DIAPERS) ×4 IMPLANT
WATER STERILE IRR 1000ML POUR (IV SOLUTION) ×4 IMPLANT

## 2015-05-16 NOTE — Transfer of Care (Signed)
Immediate Anesthesia Transfer of Care Note  Patient: Jacob Reilly  Procedure(s) Performed: Procedure(s): OPEN REDUCTION INTERNAL FIXATION (ORIF) RIGHT DISTAL RADIUS FRACTURE (Right) OPEN REDUCTION INTERNAL FIXATION OF MIDDLE PHALANGEAL FRACTURE, EXTENSOR TENDON REPAIR WITH ULNAR DIGITAL NERVE REPAIR MICROSCOPICALLY (Left)  Patient Location: PACU  Anesthesia Type:General  Level of Consciousness: awake, alert , oriented and sedated  Airway & Oxygen Therapy: Patient Spontanous Breathing and Patient connected to nasal cannula oxygen  Post-op Assessment: Report given to RN, Post -op Vital signs reviewed and stable and Patient moving all extremities  Post vital signs: Reviewed and stable  Last Vitals:  Filed Vitals:   05/16/15 0703  BP: 93/53  Pulse: 51  Temp: 36.4 C  Resp: 18    Complications: No apparent anesthesia complications

## 2015-05-16 NOTE — Progress Notes (Signed)
Dr. Renold Don in at bedside. Pt complains of pain in rt wrist 8/10. Jail LPN states no meds given for  Pain except for Advil and tylenol. Dr. Mina Marble made aware and does not wish to admit. Dr. Renold Don  Did axillary nerve block.  Pt tolerated well

## 2015-05-16 NOTE — Anesthesia Procedure Notes (Addendum)
Procedure Name: LMA Insertion Date/Time: 05/16/2015 9:48 AM Performed by: Fransisca Kaufmann Pre-anesthesia Checklist: Patient identified, Emergency Drugs available, Suction available, Patient being monitored and Timeout performed Patient Re-evaluated:Patient Re-evaluated prior to inductionOxygen Delivery Method: Circle system utilized Preoxygenation: Pre-oxygenation with 100% oxygen Intubation Type: IV induction Ventilation: Mask ventilation without difficulty LMA: LMA inserted LMA Size: 5.0 Number of attempts: 1 Placement Confirmation: positive ETCO2 and breath sounds checked- equal and bilateral Tube secured with: Tape Dental Injury: Teeth and Oropharynx as per pre-operative assessment

## 2015-05-16 NOTE — Progress Notes (Signed)
Pt 0/10 pain in rt wrist. . Rt arm placed in sling for safety while Axillary Nerve Block effective. Discharged to [facility] Dept after assessed and released  by Dr. Renold Don.
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
## 2015-05-16 NOTE — Anesthesia Preprocedure Evaluation (Addendum)
Anesthesia Evaluation  Patient identified by MRN, date of birth, ID band Patient awake    Reviewed: Allergy & Precautions, H&P , NPO status , Patient's Chart, lab work & pertinent test results  Airway Mallampati: I  TM Distance: >3 FB Neck ROM: Full    Dental no notable dental hx. (+) Teeth Intact, Dental Advisory Given   Pulmonary asthma , Current Smoker,  breath sounds clear to auscultation  Pulmonary exam normal       Cardiovascular negative cardio ROS  Rhythm:Regular Rate:Normal     Neuro/Psych PSYCHIATRIC DISORDERS negative neurological ROS     GI/Hepatic negative GI ROS, Neg liver ROS,   Endo/Other  negative endocrine ROS  Renal/GU Renal disease     Musculoskeletal   Abdominal   Peds  Hematology negative hematology ROS (+)   Anesthesia Other Findings   Reproductive/Obstetrics negative OB ROS                             Anesthesia Physical  Anesthesia Plan  ASA: II and emergent  Anesthesia Plan: General   Post-op Pain Management:    Induction: Intravenous  Airway Management Planned: LMA  Additional Equipment:   Intra-op Plan:   Post-operative Plan: Extubation in OR  Informed Consent: I have reviewed the patients History and Physical, chart, labs and discussed the procedure including the risks, benefits and alternatives for the proposed anesthesia with the patient or authorized representative who has indicated his/her understanding and acceptance.   Dental advisory given  Plan Discussed with: CRNA  Anesthesia Plan Comments:        Anesthesia Quick Evaluation

## 2015-05-16 NOTE — Op Note (Signed)
NAMEDALAN, COWGER NO.:  [id number]  MEDICAL RECORD NO.:  [id number]  LOCATION:  [facility]                         FACILITY:  [facility]  PHYSICIAN:  Artist Pais. Markise Haymer, M.D.DATE OF BIRTH:  [birth year]  DATE OF PROCEDURE:  05/16/2015 DATE OF DISCHARGE:                              OPERATIVE REPORT   PREOPERATIVE DIAGNOSES:  Right distal radius fracture and left small finger open middle phalangeal fracture with tendon artery and nerve damage.  POSTOPERATIVE DIAGNOSES:  Right distal radius fracture and left small finger open middle phalangeal fracture with tendon artery and nerve damage.  PROCEDURE: 1. ORIF, right distal radius fracture; brachioradialis tenotomy. 2. ORIF, left small finger, middle phalangeal fracture with 3 screws,     extensor tendon repair and microscopic repair of ulnar digital     nerve.  SURGEON:  Artist Pais. Mina Marble, M.D.  ASSISTANT:  Jonni Sanger, P.A.  ANESTHESIA:  General.  COMPLICATIONS:  No complication.  DRAINS:  No drains.  OPERATIVE REPORT:  The patient was taken to the operating suite.  After induction of adequate general anesthetic, left upper extremity was prepped and draped in sterile fashion.  An Esmarch was used to exsanguinate the limb.  Tourniquet was inflated to 250 mmHg.  At this point in time, the left small finger was approached surgically.  There was an old laceration along the ulnar border.  We extended midline fashion distally and midline proximally, distal ulnar and proximal radial flaps were raised.  There was a complete laceration in the extensor mechanism over the base of the middle phalanx, with an open fracture of the middle phalanx, short spiral oblique as well as neurovascular injury to the ulnar neurovascular bundle.  We carefully opened up the fracture site.  Debrided off clot.  Reduction was performed.  It was fixed with three 1.3 mm screws placed along the spiral nature of the fracture under  direct and fluoroscopic guidance. Intraoperative fluoroscopy revealed adequate reduction in AP, lateral, and oblique view.  The wound was irrigated.  We then repaired the extensor mechanism with 4-0 FiberWire.  Once this was done, we then brought the microscope onto the field and under microscopic magnification, repaired the ulnar digital nerve with 9-0 nylon x3 sutures.  The digital artery was irreparable.  The wound was then irrigated and then loosely closed with 4-0 nylon.  Xeroform, 4x4s, and an ulnar gutter splint was applied.  We then broke sterile field and then re-prepped and draped the right side.  After this was done, an Esmarch was used to exsanguinate the limb.  Tourniquet was inflated to 250 mmHg.  At this point in time, incision was made over the palmar aspect of the distal radius and wrist area on the right side.  Incision was incised sharply, dissection was carried down to the interval between the flexor carpi radialis and the radial artery.  The sheath overlying the FCR was incised.  The FCR was retracted in the midline, radial artery lateral side.  Dissection was carried down to the pronator quadratus, we subperiosteally stripped the pronator quadratus revealing an intra-articular fracture of the distal radius of the right side.  We released brachioradialis off the radial styloid fragment to aid  in reduction.  There was some callus from fracture healing as the fracture was approximately 43-week-old.  After the callus was removed, there was a small void that was filled with Biomet synthetic OrthoBlast bone grafting.  We then placed a standard right DVR plate over the volar aspect distal radius, and using the slotted hole, determined the appropriate plate position.  We then placed 2 more cortical screws proximally followed by the smooth pegs distally.  Intraoperative fluoroscopy revealed adequate reduction in AP, lateral, and oblique view.  The wound was irrigated and 2-0  Vicryl was used to close the pronator quadratus to cover the plate, 4-0 Vicryl undyed subcutaneously, and a 3-0 Prolene subcuticular stitch on the skin.  Steri-Strips, 4x4s, fluffs, and a volar splint was applied.  The patient tolerated bilateral procedures well and went to recovery room in stable fashion.     Artist Pais Mina Marble, M.D.     MAW/MEDQ  D:  05/16/2015  T:  05/16/2015  Job:  [id number]

## 2015-05-16 NOTE — Anesthesia Postprocedure Evaluation (Addendum)
Anesthesia Post Note  Patient: Jacob Reilly  Procedure(s) Performed: Procedure(s) (LRB): OPEN REDUCTION INTERNAL FIXATION (ORIF) RIGHT DISTAL RADIUS FRACTURE (Right) OPEN REDUCTION INTERNAL FIXATION OF MIDDLE PHALANGEAL FRACTURE, EXTENSOR TENDON REPAIR WITH ULNAR DIGITAL NERVE REPAIR MICROSCOPICALLY (Left)  Anesthesia type: General  Patient location: PACU  Post pain: Pain level controlled  Post assessment: Post-op Vital signs reviewed  Last Vitals: BP 124/45 mmHg  Pulse 71  Temp(Src) 36.7 C  Resp 14  Ht  (1.702 m)  Wt 145 lb (65.772 kg)  BMI 22.71 kg/m2  SpO2 100%  Post vital signs: Reviewed  Level of consciousness: sedated  PACU course: Pt with 9-10/10 pain in right wrist. I was made aware also that the patient would only have OTC pain meds after PACU d/c. Procedure and complications explained extensively to the patient in PACU. He expressed understanding and wishes to have a block to have some pain relief. Right axillary block done with u/s guidance. Good perineural spread. Patient tolerated the procedure well without complications. A total of 30 ml 0.5% bupivacaine was given. Pt was observed for 30 min and had complete pain relief.    Complications: No apparent anesthesia complications

## 2015-05-16 NOTE — Op Note (Signed)
See note [phone]

## 2015-05-16 NOTE — Progress Notes (Signed)
Orthopedic Tech Progress Note Patient Details:  Jacob Reilly 09/28/1966 [id number] Applied arm sling to RUE. Ortho Devices Type of Ortho Device: Arm sling Ortho Device/Splint Location: RUE Ortho Device/Splint Interventions: Application   [name] 05/16/2015, 2:01 PM

## 2015-05-17 ENCOUNTER — Encounter (HOSPITAL_COMMUNITY): Payer: Self-pay | Admitting: Emergency Medicine

## 2015-05-17 ENCOUNTER — Emergency Department (HOSPITAL_COMMUNITY)
Admission: EM | Admit: 2015-05-17 | Discharge: 2015-05-17 | Discharge status: Against Medical Advice | Payer: Self-pay | Attending: Emergency Medicine | Admitting: Emergency Medicine

## 2015-05-17 DIAGNOSIS — Z5321 Procedure and treatment not carried out due to patient leaving prior to being seen by health care provider: Secondary | ICD-10-CM

## 2015-05-17 DIAGNOSIS — Z720 Tobacco use: Secondary | ICD-10-CM | POA: Insufficient documentation

## 2015-05-17 DIAGNOSIS — G8918 Other acute postprocedural pain: Secondary | ICD-10-CM | POA: Insufficient documentation

## 2015-05-17 DIAGNOSIS — J45909 Unspecified asthma, uncomplicated: Secondary | ICD-10-CM | POA: Insufficient documentation

## 2015-05-17 NOTE — ED Notes (Addendum)
Patient stepped out of room stating that he wasn't waiting in longer. Patient used profanity and stated he would not wait.  Patient left without being seen.
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
## 2015-05-18 ENCOUNTER — Encounter (HOSPITAL_COMMUNITY): Payer: Self-pay | Admitting: Orthopedic Surgery

## 2015-06-17 ENCOUNTER — Ambulatory Visit: Payer: MEDICAID | Attending: Orthopedic Surgery | Admitting: Occupational Therapy

## 2015-06-17 DIAGNOSIS — M79642 Pain in left hand: Secondary | ICD-10-CM

## 2015-06-17 DIAGNOSIS — M25642 Stiffness of left hand, not elsewhere classified: Secondary | ICD-10-CM

## 2015-06-17 NOTE — Patient Instructions (Signed)
WEARING SCHEDULE:  Wear splint at ALL times except for hygiene care Purpose: To prevent movement and for protection until injury can heal  CARE OF SPLINT:  Keep splint away from heat sources including: stove, radiator or furnace, or a car in sunlight. The splint can melt and will no longer fit you properly  Keep away from pets and children  Clean the splint with rubbing alcohol 1-2 times per day.  * During this time, make sure you also clean your hand/arm as instructed by your therapist and/or perform dressing changes as needed. Then dry hand/arm completely before replacing splint. (When cleaning hand/arm, keep it immobilized in same position until splint is replaced)  Perform wet to dry dressing 1x day, wet 1/2 a piece of gauze with saline, top with a dry piece of gauze, top with finger stockinette. Remove 1x day, can dab gently with saline and clean gauze if needed.  Contact Dr. Ronie Spies office if you have signs of infection or questions about the appearance of your wound  PRECAUTIONS/POTENTIAL PROBLEMS: *If you notice or experience increased pain, swelling, numbness, or a lingering reddened area from the splint: Contact your therapist immediately by calling [phone]. You must wear the splint for protection, but we will get you scheduled for adjustments as quickly as possible.  (If only straps or hooks need to be replaced and NO adjustments to the splint need to be made, just call the office ahead and let them know you are coming in)  If you have any medical concerns or signs of infection, please call your doctor immediately
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
## 2015-06-24 ENCOUNTER — Ambulatory Visit (HOSPITAL_COMMUNITY): Payer: MEDICAID | Attending: Orthopedic Surgery | Admitting: Specialist

## 2015-11-19 IMAGING — DX DG WRIST COMPLETE 3+V*R*
4 series · 4 of 4 positions shown · non-contrast
Comparison: 05/01/2015

CLINICAL DATA: Fell 2 days ago.

EXAM:
RIGHT WRIST - COMPLETE 3+ VIEW

[wrist pa]
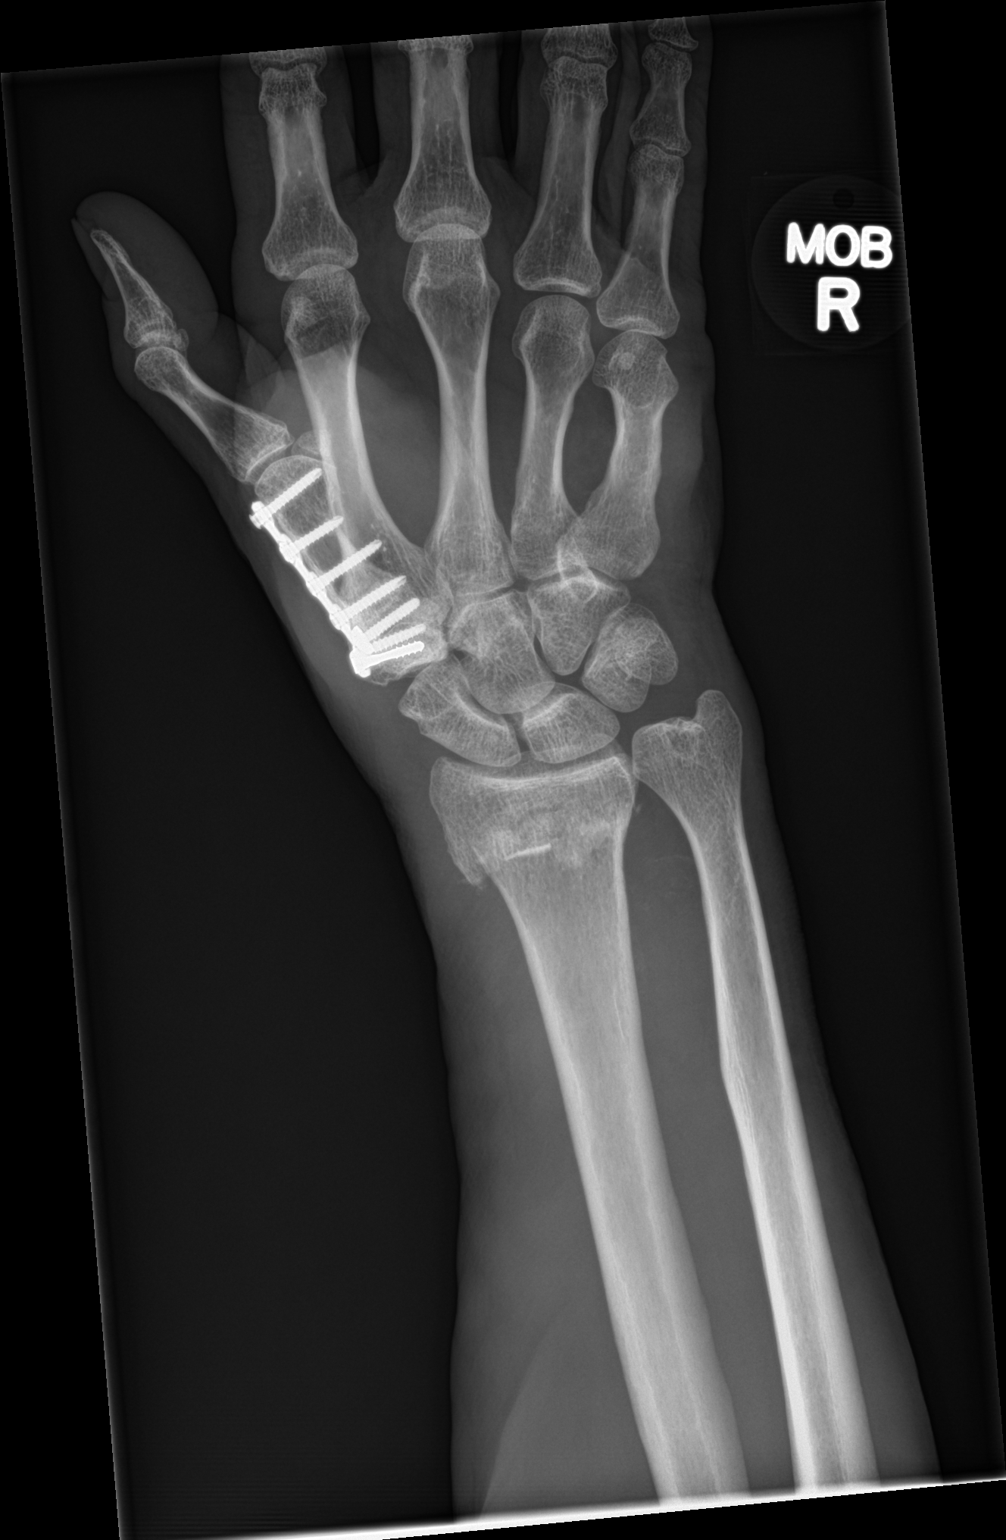

[wrist obl]
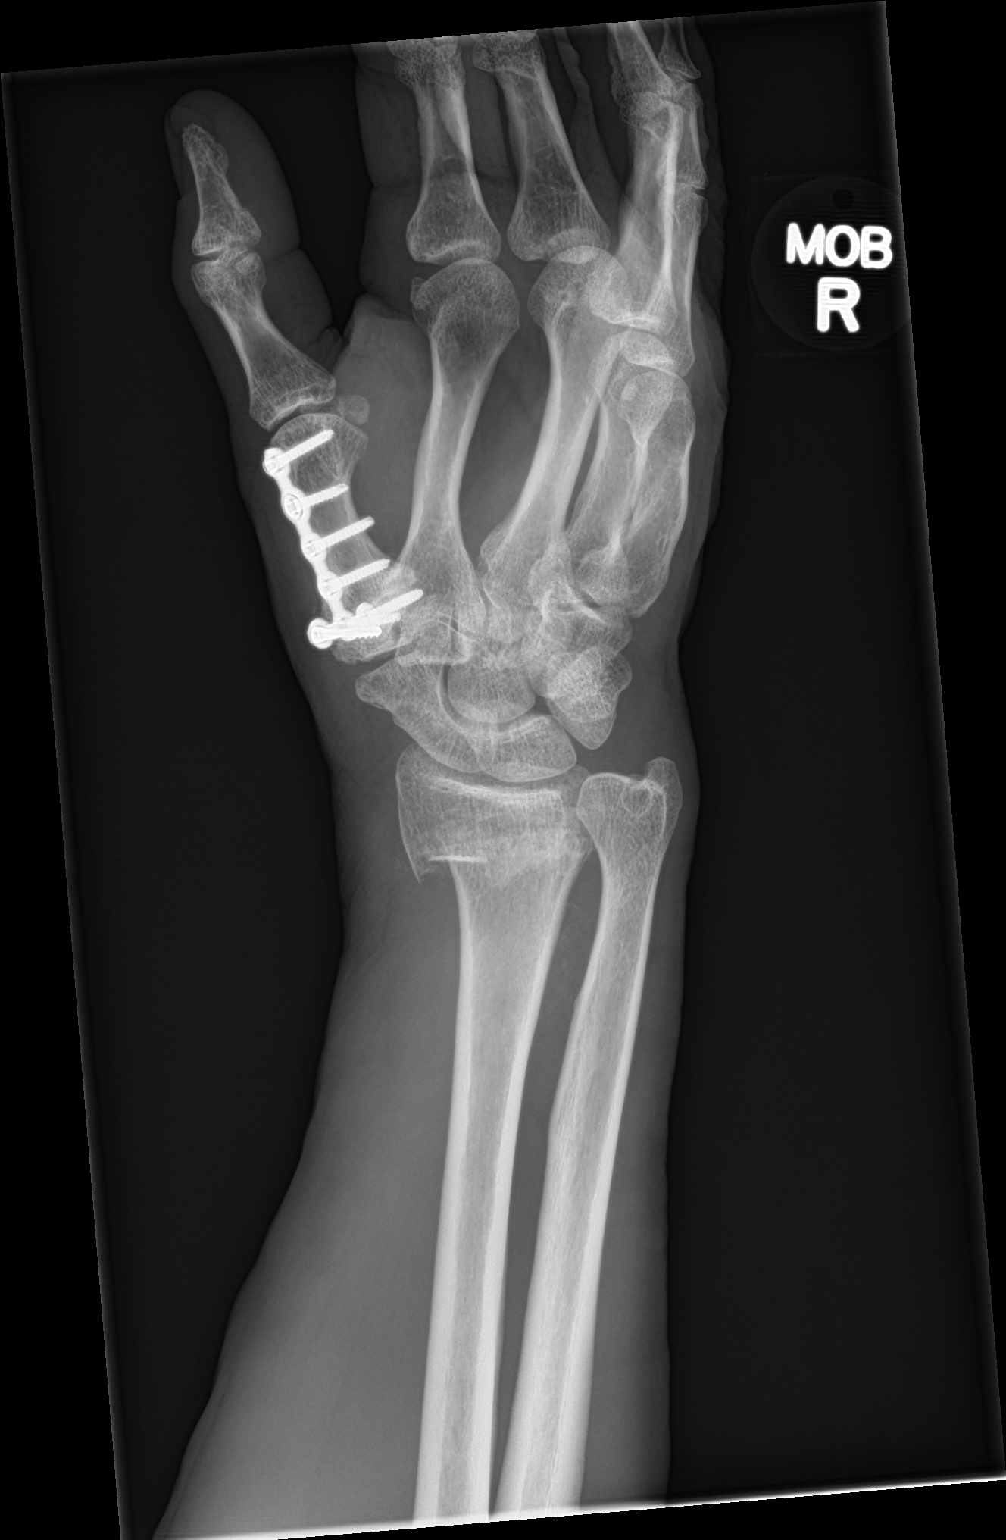

[wrist lat]
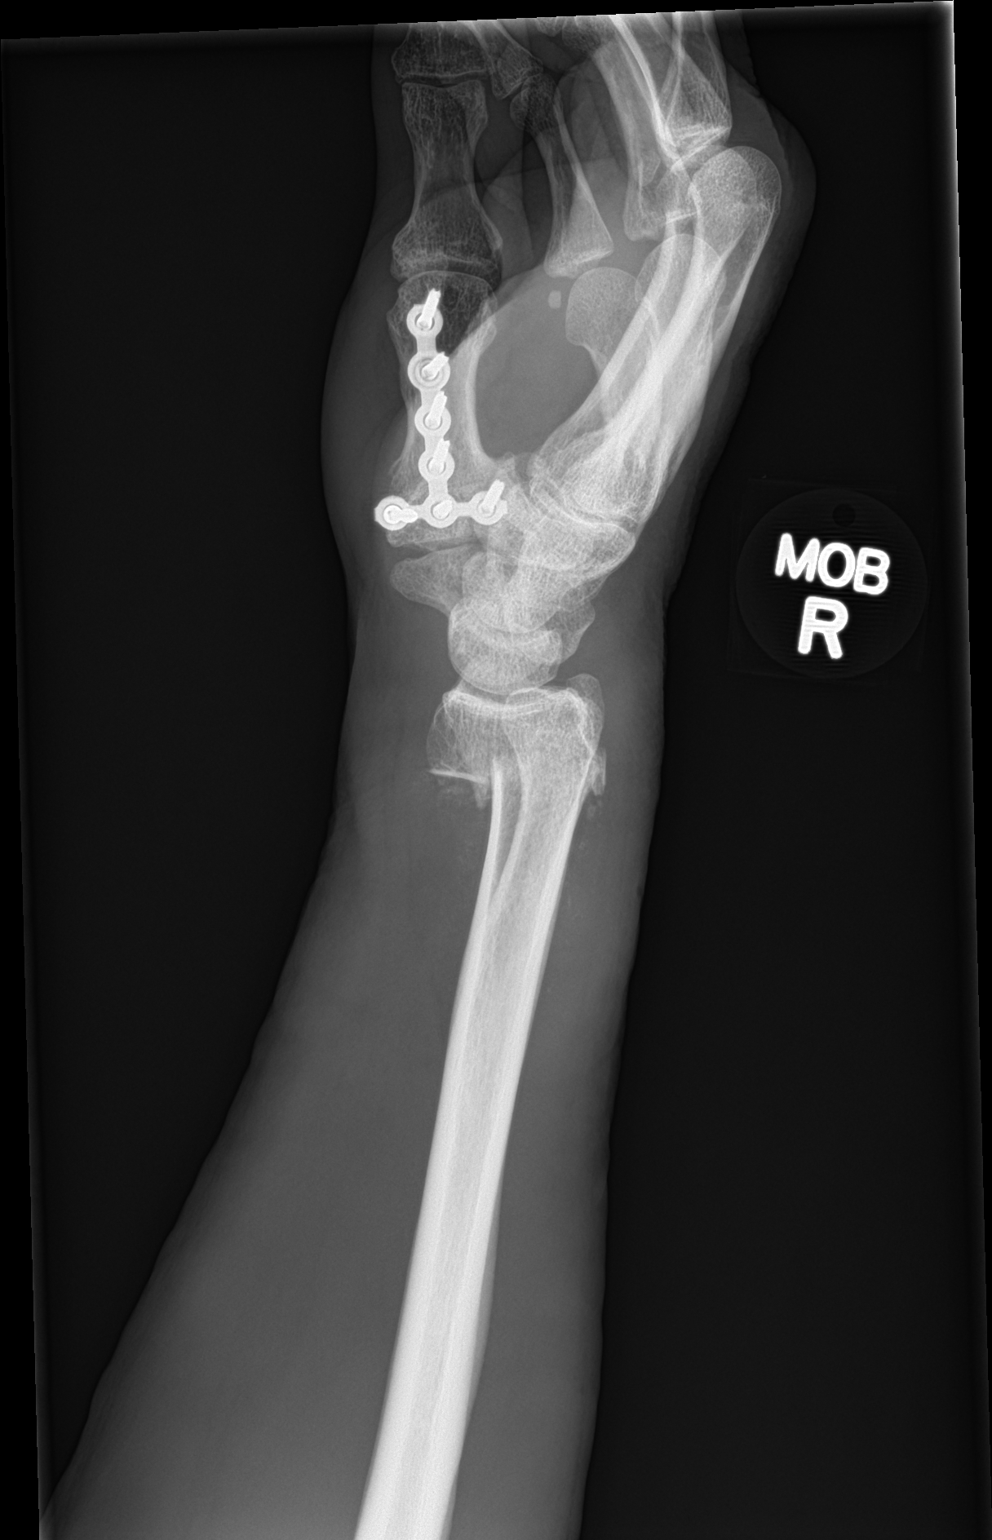

[wrist navicular]
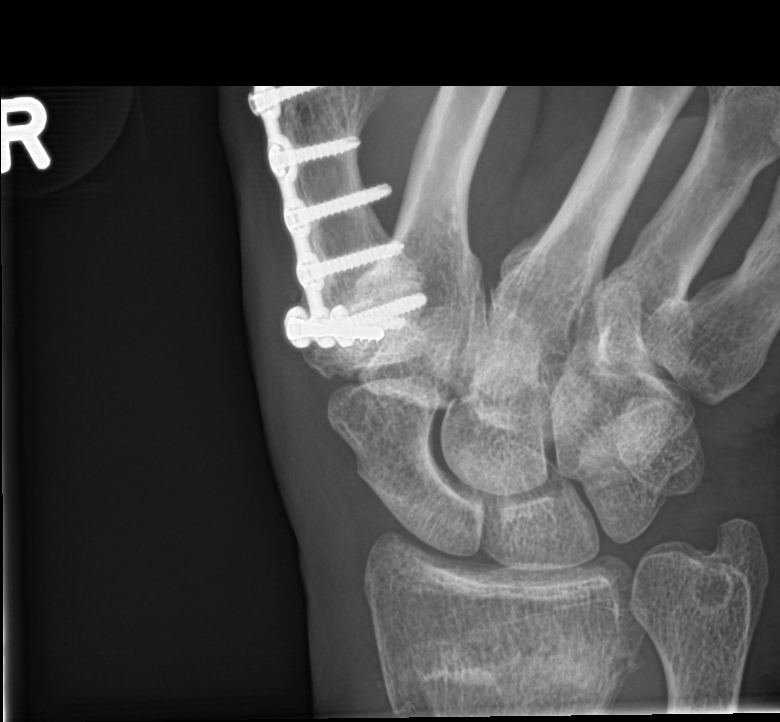

[4 of 4 positions shown; findings below may reference images not displayed]

FINDINGS: The distal radius fracture observed on 05/01/2015 is now markedly
impacted with mild volar displacement. The articulations of the
wrist remain intact. There is old plate screw fixation about the
first metacarpal.
IMPRESSION: Impaction and displacement about the transverse fracture of the
distal radius.

## 2015-12-27 ENCOUNTER — Emergency Department (HOSPITAL_COMMUNITY)
Admission: EM | Admit: 2015-12-27 | Discharge: 2015-12-28 | Disposition: A | Discharge status: Home / Self Care | Payer: Self-pay | Attending: Emergency Medicine | Admitting: Emergency Medicine

## 2015-12-27 ENCOUNTER — Emergency Department (HOSPITAL_COMMUNITY): Payer: Self-pay

## 2015-12-27 ENCOUNTER — Encounter (HOSPITAL_COMMUNITY): Payer: Self-pay | Admitting: *Deleted

## 2015-12-27 DIAGNOSIS — Z87442 Personal history of urinary calculi: Secondary | ICD-10-CM | POA: Insufficient documentation

## 2015-12-27 DIAGNOSIS — M549 Dorsalgia, unspecified: Secondary | ICD-10-CM

## 2015-12-27 DIAGNOSIS — J45909 Unspecified asthma, uncomplicated: Secondary | ICD-10-CM | POA: Insufficient documentation

## 2015-12-27 DIAGNOSIS — Z87891 Personal history of nicotine dependence: Secondary | ICD-10-CM | POA: Insufficient documentation

## 2015-12-27 DIAGNOSIS — G8929 Other chronic pain: Secondary | ICD-10-CM | POA: Insufficient documentation

## 2015-12-27 DIAGNOSIS — R200 Anesthesia of skin: Secondary | ICD-10-CM | POA: Insufficient documentation

## 2015-12-27 DIAGNOSIS — M545 Low back pain: Secondary | ICD-10-CM | POA: Insufficient documentation

## 2015-12-27 MED ORDER — IBUPROFEN 400 MG PO TABS
600.0000 mg | ORAL_TABLET | Freq: Once | ORAL | Status: AC
  Administered 2015-12-27: 600 mg via ORAL
  Filled 2015-12-27: qty 2
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
## 2015-12-27 NOTE — ED Provider Notes (Signed)
CSN: [id number]     Arrival date & time 12/27/15  2203 History  By signing my name below, I, Marisue HumbleMichelle Chaffee, attest that this documentation has been prepared under the direction and in the presence of Lavera Guiseana Duo Liu, MD . Electronically Signed: Marisue HumbleMichelle Chaffee, Scribe. 12/27/2015. 10:44 PM.   Chief Complaint  Patient presents with  . Back Pain   The history is provided by the patient. No language interpreter was used.   HPI Comments:  Jacob Reilly is a 50 y.o. male with PMHx of back surgery and kidney calculi who presents to the Emergency Department complaining of chronic, sharp, shooting lower back pain radiating to legs, worsening in the past few months. Pt reports associated "popping and grinding" in his back, and chronic intermittent numbness and weakness in both legs He has taken Aleve with temporary relief. Pt states he broke his back in 1994 and has fusion to L-3, L-4, and L-5. He also reports 40 ft fall in 2013 and a similar fall in June 2016. Pt has not followed up with a spine specialist. Pt denies recent falls, heavy lifting, abdominal pain, urine retention, blood in urine, or incontinence.   Past Medical History  Diagnosis Date  . Asthma     as child-now with allergy season only.  . Kidney calculi    Past Surgical History  Procedure Laterality Date  . Back surgery    . I&d extremity  10/24/2012    Procedure: IRRIGATION AND DEBRIDEMENT EXTREMITY;  Surgeon: Tami RibasKevin R Kuzma, MD;  [facility];  Service: Orthopedics;  Laterality: Right;  . Open reduction internal fixation (orif) hand Right thumb  . Hydrocele excision Left 08/01/2013    Procedure: HYDROCELECTOMY ADULT;  Surgeon: Ky BarbanMohammad I Javaid, MD;  Location: [facility] ORS;  Service: Urology;  Laterality: Left;  . Thumb fusion    . Open reduction internal fixation (orif) distal radial fracture Right 05/16/2015    Procedure: OPEN REDUCTION INTERNAL FIXATION (ORIF) RIGHT DISTAL RADIUS FRACTURE;  Surgeon: Dairl PonderMatthew Weingold, MD;  Location:  [facility] OR;  Service: Orthopedics;  Laterality: Right;  . Open reduction internal fixation (orif) metacarpal Left 05/16/2015    Procedure: OPEN REDUCTION INTERNAL FIXATION OF MIDDLE PHALANGEAL FRACTURE, EXTENSOR TENDON REPAIR WITH ULNAR DIGITAL NERVE REPAIR MICROSCOPICALLY;  Surgeon: Dairl PonderMatthew Weingold, MD;  Location: [facility] OR;  Service: Orthopedics;  Laterality: Left;   History reviewed. No pertinent family history. Social History  Substance Use Topics  . Smoking status: Former Smoker -- 0.50 packs/day for 30 years    Types: Cigarettes    Quit date: 11/16/2014  . Smokeless tobacco: None  . Alcohol Use: No    Review of Systems  Gastrointestinal: Negative for abdominal pain.  Genitourinary: Negative for hematuria and difficulty urinating.  Musculoskeletal: Positive for back pain.  Neurological: Positive for weakness and numbness.  All other systems reviewed and are negative.  Allergies  Penicillins  Home Medications   Prior to Admission medications   Medication Sig Start Date End Date Taking? Authorizing Provider  albuterol (PROVENTIL HFA;VENTOLIN HFA) 108 (90 BASE) MCG/ACT inhaler Inhale 2 puffs into the lungs every 6 (six) hours as needed for wheezing or shortness of breath.    Historical Provider, MD  ibuprofen (ADVIL,MOTRIN) 600 MG tablet Take 600 mg by mouth 2 (two) times daily.    Historical Provider, MD   BP 128/88 mmHg  Pulse 65  Temp(Src) 98.7 F (37.1 C) (Oral)  Resp 16  Ht 5\' 7"  (1.702 m)  Wt 200 lb (90.719 kg)  BMI 31.32 kg/m2  SpO2 99% Physical Exam Physical Exam  Nursing note and vitals reviewed. Constitutional: Well developed, well nourished, non-toxic, and in no acute distress Head: Normocephalic and atraumatic.  Mouth/Throat: Oropharynx is clear and moist.  Neck: Normal range of motion. Neck supple.  Cardiovascular: Normal rate and regular rhythm.   Pulmonary/Chest: Effort normal and breath sounds normal.  Abdominal: Soft. There is no tenderness. There is no  rebound and no guarding.  Musculoskeletal: Normal range of motion. Old midline surgical scar over lumbar spine. Neurological: Alert, no facial droop, fluent speech, moves all extremities symmetrically; full strength in hip flexion, extension, abduction and adduction; full strength in knee flexion, extension and ankle dorsi flexion and plantar flexion; sensation grossly intact to BLE  Skin: Skin is warm and dry.  Psychiatric: Cooperative   ED Course  Procedures  DIAGNOSTIC STUDIES:  Oxygen Saturation is 99% on RA, normal by my interpretation.    COORDINATION OF CARE:  10:38 PM Will order lumbar x-rays. Will give referral to neurosurgeon at [facility]. Discussed treatment plan with pt at bedside and pt agreed to plan.  Labs Review Labs Reviewed - No data to display  Imaging Review No results found. I have personally reviewed and evaluated these images and lab results as part of my medical decision-making.   EKG Interpretation None      MDM   Final diagnoses:  Back pain    50 year old male with history of chronic back pain in the setting of multiple injuries and surgeries in the past who presents with gradually worsening back pain over the course of the past several months. Has no new neurological deficits or complaints. He is well-appearing with stable vital signs. Soft and benign abdomen. Grossly neurologically intact exam without concern for signs or symptoms of serious spine or spinal cord process. XR without new fracture or processes. Stable broken screw. Given outpatient spine follow-up. Strict return and follow-up instructions reviewed. She expressed understanding of all discharge instructions and felt comfortable with the plan of care.   I personally performed the services described in this documentation, which was scribed in my presence. The recorded information has been reviewed and is accurate.   Lavera Guise, MD 12/28/15 [phone]

## 2015-12-28 NOTE — ED Notes (Signed)
Pt states understanding of care given and follow up instructions 

## 2015-12-28 NOTE — Discharge Instructions (Signed)
Return for worsening symptoms, including new numbness/weakness, inability to walk, loss of control of bowel or bladder, or any other symptoms concerning to you.  Back Pain, Adult Back pain is very common in adults.The cause of back pain is rarely dangerous and the pain often gets better over time.The cause of your back pain may not be known. Some common causes of back pain include:  Strain of the muscles or ligaments supporting the spine.  Wear and tear (degeneration) of the spinal disks.  Arthritis.  Direct injury to the back. For many people, back pain may return. Since back pain is rarely dangerous, most people can learn to manage this condition on their own. HOME CARE INSTRUCTIONS Watch your back pain for any changes. The following actions may help to lessen any discomfort you are feeling:  Remain active. It is stressful on your back to sit or stand in one place for long periods of time. Do not sit, drive, or stand in one place for more than 30 minutes at a time. Take short walks on even surfaces as soon as you are able.Try to increase the length of time you walk each day.  Exercise regularly as directed by your health care provider. Exercise helps your back heal faster. It also helps avoid future injury by keeping your muscles strong and flexible.  Do not stay in bed.Resting more than 1-2 days can delay your recovery.  Pay attention to your body when you bend and lift. The most comfortable positions are those that put less stress on your recovering back. Always use proper lifting techniques, including:  Bending your knees.  Keeping the load close to your body.  Avoiding twisting.  Find a comfortable position to sleep. Use a firm mattress and lie on your side with your knees slightly bent. If you lie on your back, put a pillow under your knees.  Avoid feeling anxious or stressed.Stress increases muscle tension and can worsen back pain.It is important to recognize when you  are anxious or stressed and learn ways to manage it, such as with exercise.  Take medicines only as directed by your health care provider. Over-the-counter medicines to reduce pain and inflammation are often the most helpful.Your health care provider may prescribe muscle relaxant drugs.These medicines help dull your pain so you can more quickly return to your normal activities and healthy exercise.  Apply ice to the injured area:  Put ice in a plastic bag.  Place a towel between your skin and the bag.  Leave the ice on for 20 minutes, 2-3 times a day for the first 2-3 days. After that, ice and heat may be alternated to reduce pain and spasms.  Maintain a healthy weight. Excess weight puts extra stress on your back and makes it difficult to maintain good posture. SEEK MEDICAL CARE IF:  You have pain that is not relieved with rest or medicine.  You have increasing pain going down into the legs or buttocks.  You have pain that does not improve in one week.  You have night pain.  You lose weight.  You have a fever or chills. SEEK IMMEDIATE MEDICAL CARE IF:   You develop new bowel or bladder control problems.  You have unusual weakness or numbness in your arms or legs.  You develop nausea or vomiting.  You develop abdominal pain.  You feel faint.   This information is not intended to replace advice given to you by your health care provider. Make sure you discuss any  questions you have with your health care provider.   Document Released: 10/03/2005 Document Revised: 10/24/2014 Document Reviewed: 02/04/2014 Elsevier Interactive Patient Education [employer].

## 2016-01-13 ENCOUNTER — Ambulatory Visit: Payer: Self-pay | Admitting: Physician Assistant

## 2016-01-13 ENCOUNTER — Encounter: Payer: Self-pay | Admitting: Physician Assistant

## 2016-01-13 VITALS — BP 108/84 | HR 73 | Temp 97.7°F | Ht 65.25 in | Wt 200.0 lb

## 2016-01-13 DIAGNOSIS — Z125 Encounter for screening for malignant neoplasm of prostate: Secondary | ICD-10-CM

## 2016-01-13 DIAGNOSIS — F419 Anxiety disorder, unspecified: Secondary | ICD-10-CM

## 2016-01-13 DIAGNOSIS — Z131 Encounter for screening for diabetes mellitus: Secondary | ICD-10-CM

## 2016-01-13 DIAGNOSIS — Z13220 Encounter for screening for lipoid disorders: Secondary | ICD-10-CM

## 2016-01-13 DIAGNOSIS — M549 Dorsalgia, unspecified: Secondary | ICD-10-CM

## 2016-01-13 DIAGNOSIS — G8929 Other chronic pain: Secondary | ICD-10-CM

## 2016-01-13 LAB — GLUCOSE, POCT (MANUAL RESULT ENTRY): POC Glucose: 108 mg/dl — AB (ref 70–99)
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
## 2016-01-15 DIAGNOSIS — G8929 Other chronic pain: Secondary | ICD-10-CM | POA: Insufficient documentation

## 2016-01-15 DIAGNOSIS — F419 Anxiety disorder, unspecified: Secondary | ICD-10-CM | POA: Insufficient documentation

## 2016-01-15 LAB — CBC WITH DIFFERENTIAL/PLATELET
BASOS ABS: 0.1 10*3/uL (ref 0.0–0.1)
Basophils Relative: 1 % (ref 0–1)
EOS PCT: 3 % (ref 0–5)
Eosinophils Absolute: 0.3 10*3/uL (ref 0.0–0.7)
HEMATOCRIT: 41.8 % (ref 39.0–52.0)
Hemoglobin: 14.1 g/dL (ref 13.0–17.0)
LYMPHS ABS: 2.4 10*3/uL (ref 0.7–4.0)
LYMPHS PCT: 29 % (ref 12–46)
MCH: 29.9 pg (ref 26.0–34.0)
MCHC: 33.7 g/dL (ref 30.0–36.0)
MCV: 88.6 fL (ref 78.0–100.0)
MONOS PCT: 10 % (ref 3–12)
MPV: 9.7 fL (ref 8.6–12.4)
Monocytes Absolute: 0.8 10*3/uL (ref 0.1–1.0)
Neutro Abs: 4.8 10*3/uL (ref 1.7–7.7)
Neutrophils Relative %: 57 % (ref 43–77)
Platelets: 284 10*3/uL (ref 150–400)
RBC: 4.72 MIL/uL (ref 4.22–5.81)
RDW: 13.5 % (ref 11.5–15.5)
WBC: 8.4 10*3/uL (ref 4.0–10.5)

## 2016-01-15 LAB — HEMOGLOBIN A1C
Hgb A1c MFr Bld: 5.2 % (ref <5.7)
Mean Plasma Glucose: 103 mg/dL

## 2016-01-15 LAB — LIPID PANEL
CHOL/HDL RATIO: 4.8 ratio (ref <=5.0)
CHOLESTEROL: 202 mg/dL — AB (ref 125–200)
HDL: 42 mg/dL (ref >=40)
LDL Cholesterol: 141 mg/dL — ABNORMAL HIGH (ref <130)
TRIGLYCERIDES: 96 mg/dL (ref <150)
VLDL: 19 mg/dL (ref <30)

## 2016-01-15 LAB — COMPLETE METABOLIC PANEL WITH GFR
ALT: 24 U/L (ref 9–46)
AST: 20 U/L (ref 10–35)
Albumin: 4.7 g/dL (ref 3.6–5.1)
Alkaline Phosphatase: 76 U/L (ref 40–115)
BUN: 24 mg/dL (ref 7–25)
CALCIUM: 9.8 mg/dL (ref 8.6–10.3)
CO2: 26 mmol/L (ref 20–31)
CREATININE: 0.98 mg/dL (ref 0.70–1.33)
Chloride: 106 mmol/L (ref 98–110)
GFR, Est African American: >89 mL/min (ref >=60)
GFR, Est Non African American: >89 mL/min (ref >=60)
Glucose, Bld: 79 mg/dL (ref 65–99)
Potassium: 4.5 mmol/L (ref 3.5–5.3)
Sodium: 141 mmol/L (ref 135–146)
Total Bilirubin: 0.6 mg/dL (ref 0.2–1.2)
Total Protein: 7.2 g/dL (ref 6.1–8.1)

## 2016-01-15 LAB — PSA: PSA: 0.48 ng/mL (ref <=4.00)

## 2016-02-10 ENCOUNTER — Ambulatory Visit: Payer: Self-pay | Admitting: Physician Assistant

## 2016-02-10 ENCOUNTER — Encounter: Payer: Self-pay | Admitting: Physician Assistant

## 2016-02-10 VITALS — BP 118/72 | HR 73 | Temp 97.7°F | Ht 65.25 in | Wt 200.2 lb

## 2016-02-10 DIAGNOSIS — Z1211 Encounter for screening for malignant neoplasm of colon: Secondary | ICD-10-CM

## 2016-02-10 DIAGNOSIS — E785 Hyperlipidemia, unspecified: Secondary | ICD-10-CM | POA: Insufficient documentation

## 2016-02-10 DIAGNOSIS — M545 Low back pain, unspecified: Secondary | ICD-10-CM | POA: Insufficient documentation

## 2016-02-10 DIAGNOSIS — G8929 Other chronic pain: Secondary | ICD-10-CM

## 2016-02-10 DIAGNOSIS — F17219 Nicotine dependence, cigarettes, with unspecified nicotine-induced disorders: Secondary | ICD-10-CM | POA: Insufficient documentation

## 2016-02-10 MED ORDER — ATORVASTATIN CALCIUM 20 MG PO TABS: 20.0000 mg | ORAL_TABLET | Freq: Every day | ORAL | Status: DC

## 2016-02-10 NOTE — Patient Instructions (Signed)

## 2016-02-10 NOTE — Progress Notes (Signed)
BP 118/72 mmHg  Pulse 73  Temp(Src) 97.7 F (36.5 C)  Ht 5' 5.25" (1.657 m)  Wt 200 lb 3.2 oz (90.81 kg)  BMI 33.07 kg/m2  SpO2 98%   Subjective:    Patient ID: Jacob Reilly, male    DOB: [birth year], 50 y.o.   MRN: [id number]  HPI: Jacob Reilly is a 50 y.o. male presenting on 02/10/2016 for Back Pain   HPI   Pt going to [facility] for [facility] issues  Pt says he turned in his [facility] app and was approved (see previous OV for back pain/refer to ortho)  Relevant past medical, surgical, family and social history reviewed and updated as indicated. Interim medical history since our last visit reviewed. Allergies and medications reviewed and updated.  CURRENT MEDS: Albuterol MDI cymbalta 85m trazedone 1079m Review of Systems  Constitutional: Positive for chills, diaphoresis, appetite change and fatigue. Negative for fever and unexpected weight change.  HENT: Positive for sneezing. Negative for congestion, dental problem, drooling, ear pain, facial swelling, hearing loss, mouth sores, sore throat, trouble swallowing and voice change.   Eyes: Positive for itching. Negative for pain, discharge, redness and visual disturbance.  Respiratory: Negative for cough, choking, shortness of breath and wheezing.   Cardiovascular: Negative for chest pain, palpitations and leg swelling.  Gastrointestinal: Positive for diarrhea and constipation. Negative for vomiting, abdominal pain and blood in stool.  Endocrine: Negative for cold intolerance, heat intolerance and polydipsia.  Genitourinary: Negative for dysuria, hematuria and decreased urine volume.  Musculoskeletal: Positive for back pain, arthralgias and gait problem.  Skin: Negative for rash.  Allergic/Immunologic: Positive for environmental allergies.  Neurological: Negative for seizures, syncope, light-headedness and headaches.  Hematological: Negative for adenopathy.  Psychiatric/Behavioral: Positive for dysphoric mood and agitation.  Negative for suicidal ideas. The patient is nervous/anxious.     Per HPI unless specifically indicated above     Objective:    BP 118/72 mmHg  Pulse 73  Temp(Src) 97.7 F (36.5 C)  Ht 5' 5.25" (1.657 m)  Wt 200 lb 3.2 oz (90.81 kg)  BMI 33.07 kg/m2  SpO2 98%  Wt Readings from Last 3 Encounters:  02/10/16 200 lb 3.2 oz (90.81 kg)  01/13/16 200 lb (90.719 kg)  12/27/15 200 lb (90.719 kg)    Physical Exam  Constitutional: He is oriented to person, place, and time. He appears well-developed and well-nourished.  HENT:  Head: Normocephalic and atraumatic.  Neck: Neck supple.  Cardiovascular: Normal rate and regular rhythm.   Pulmonary/Chest: Effort normal and breath sounds normal. He has no wheezes.  Abdominal: Soft. Bowel sounds are normal. There is no hepatosplenomegaly. There is no tenderness.  Musculoskeletal: He exhibits no edema.  Lymphadenopathy:    He has no cervical adenopathy.  Neurological: He is alert and oriented to person, place, and time.  Skin: Skin is warm and dry.  Psychiatric: He has a normal mood and affect. His behavior is normal.  Vitals reviewed.   Results for orders placed or performed in visit on 01/13/16  Lipid Profile  Result Value Ref Range   Cholesterol 202 (H) 125 - 200 mg/dL   Triglycerides 96 <150 mg/dL   HDL 42 >=40 mg/dL   Total CHOL/HDL Ratio 4.8 <=5.0 Ratio   VLDL 19 <30 mg/dL   LDL Cholesterol 141 (H) <130 mg/dL  CBC w/Diff/Platelet  Result Value Ref Range   WBC 8.4 4.0 - 10.5 K/uL   RBC 4.72 4.22 - 5.81 MIL/uL   Hemoglobin 14.1 13.0 -  17.0 g/dL   HCT 41.8 39.0 - 52.0 %   MCV 88.6 78.0 - 100.0 fL   MCH 29.9 26.0 - 34.0 pg   MCHC 33.7 30.0 - 36.0 g/dL   RDW 13.5 11.5 - 15.5 %   Platelets 284 150 - 400 K/uL   MPV 9.7 8.6 - 12.4 fL   Neutrophils Relative % 57 43 - 77 %   Neutro Abs 4.8 1.7 - 7.7 K/uL   Lymphocytes Relative 29 12 - 46 %   Lymphs Abs 2.4 0.7 - 4.0 K/uL   Monocytes Relative 10 3 - 12 %   Monocytes Absolute 0.8  0.1 - 1.0 K/uL   Eosinophils Relative 3 0 - 5 %   Eosinophils Absolute 0.3 0.0 - 0.7 K/uL   Basophils Relative 1 0 - 1 %   Basophils Absolute 0.1 0.0 - 0.1 K/uL   Smear Review Criteria for review not met   COMPLETE METABOLIC PANEL WITH GFR  Result Value Ref Range   Sodium 141 135 - 146 mmol/L   Potassium 4.5 3.5 - 5.3 mmol/L   Chloride 106 98 - 110 mmol/L   CO2 26 20 - 31 mmol/L   Glucose, Bld 79 65 - 99 mg/dL   BUN 24 7 - 25 mg/dL   Creat 0.98 0.70 - 1.33 mg/dL   Total Bilirubin 0.6 0.2 - 1.2 mg/dL   Alkaline Phosphatase 76 40 - 115 U/L   AST 20 10 - 35 U/L   ALT 24 9 - 46 U/L   Total Protein 7.2 6.1 - 8.1 g/dL   Albumin 4.7 3.6 - 5.1 g/dL   Calcium 9.8 8.6 - 10.3 mg/dL   GFR, Est African American >89 >=60 mL/min   GFR, Est Non African American >89 >=60 mL/min  HgB A1c  Result Value Ref Range   Hgb A1c MFr Bld 5.2 <5.7 %   Mean Plasma Glucose 103 mg/dL  PSA  Result Value Ref Range   PSA 0.48 <=4.00 ng/mL  POCT Glucose (CBG)  Result Value Ref Range   POC Glucose 108 (A) 70 - 99 mg/dl      Assessment & Plan:   Encounter Diagnoses  Name Primary?  . Hyperlipidemia Yes  . Low back pain, unspecified back pain laterality, with sciatica presence unspecified   . Chronic pain   . Cigarette nicotine dependence with nicotine-induced disorder   . Special screening for malignant neoplasms, colon     -reviewed labs with pt -rx Atorvastatin for cholesterol from [facility].  He thinks he is already signed up for [facility].  Lowfat diet sheet given -Counseled on smoking cessation -Gave iFOBT -Refer to ortho for back -F/u 3 months.  RTO sooner prn

## 2016-02-11 LAB — IFOBT (OCCULT BLOOD): IFOBT: NEGATIVE

## 2016-03-09 ENCOUNTER — Ambulatory Visit (INDEPENDENT_AMBULATORY_CARE_PROVIDER_SITE_OTHER): Payer: Self-pay | Admitting: Orthopaedic Surgery

## 2016-03-09 ENCOUNTER — Encounter: Payer: Self-pay | Admitting: Orthopaedic Surgery

## 2016-03-09 VITALS — BP 143/77 | HR 96 | Temp 98.1°F | Ht 67.0 in | Wt 196.0 lb

## 2016-03-09 DIAGNOSIS — M5442 Lumbago with sciatica, left side: Secondary | ICD-10-CM

## 2016-03-09 MED ORDER — HYDROCODONE-ACETAMINOPHEN 5-325 MG PO TABS: 1.0000 | ORAL_TABLET | ORAL | Status: DC | PRN

## 2016-03-09 NOTE — Progress Notes (Signed)
He presented with history of lower back pain, prior surgery by Dr. Danielle DessElsner with broken screw in the back.  I do not do back surgery.  I will have [facility] by [facility] who does back surgery.  He has [facility] and [facility].  Patient aware and agreeable.

## 2016-03-10 NOTE — Addendum Note (Signed)
Addended by: Diamantina MonksBOOTHE, Rupal Childress B on: 03/10/2016 08:33 AM   Modules accepted: Orders

## 2016-03-28 ENCOUNTER — Telehealth: Payer: Self-pay | Admitting: Radiology

## 2016-03-28 MED ORDER — HYDROCODONE-ACETAMINOPHEN 5-325 MG PO TABS: 1.0000 | ORAL_TABLET | ORAL | Status: DC | PRN

## 2016-03-28 NOTE — Telephone Encounter (Signed)
5/325 mg Hydrocodone # 60.    Also wants an out of work note till 04/14/16 when he sees the Dr[place] to.

## 2016-03-28 NOTE — Telephone Encounter (Signed)
Called patient, aware.* Dr Hilda LiasKeeling, please advise as to whether out of work note is to be effective as of date of patient's visit 03/09/16 through the date of the appointment in [place], which is noted as 04/14/16.  Patient would like to pick up note tomorrow, 03/29/16.

## 2016-03-28 NOTE — Telephone Encounter (Signed)
Rx done. 

## 2016-03-28 NOTE — Telephone Encounter (Signed)
Rx done.  Give out of work note until 04-05-16 as he requested.

## 2016-03-29 ENCOUNTER — Encounter: Payer: Self-pay | Admitting: Orthopaedic Surgery

## 2016-03-29 NOTE — Telephone Encounter (Signed)
OK. Make it so

## 2016-03-29 NOTE — Telephone Encounter (Signed)
Note completed. Patient picked up today, 03/29/16.

## 2016-04-04 ENCOUNTER — Emergency Department (HOSPITAL_COMMUNITY): Payer: Self-pay

## 2016-04-04 ENCOUNTER — Encounter (HOSPITAL_COMMUNITY): Payer: Self-pay | Admitting: Emergency Medicine

## 2016-04-04 ENCOUNTER — Emergency Department (HOSPITAL_COMMUNITY)
Admission: EM | Admit: 2016-04-04 | Discharge: 2016-04-04 | Disposition: A | Discharge status: Home / Self Care | Payer: Self-pay | Attending: Emergency Medicine | Admitting: Emergency Medicine

## 2016-04-04 DIAGNOSIS — Y999 Unspecified external cause status: Secondary | ICD-10-CM | POA: Insufficient documentation

## 2016-04-04 DIAGNOSIS — F17210 Nicotine dependence, cigarettes, uncomplicated: Secondary | ICD-10-CM | POA: Insufficient documentation

## 2016-04-04 DIAGNOSIS — Y939 Activity, unspecified: Secondary | ICD-10-CM | POA: Insufficient documentation

## 2016-04-04 DIAGNOSIS — W010XXA Fall on same level from slipping, tripping and stumbling without subsequent striking against object, initial encounter: Secondary | ICD-10-CM | POA: Insufficient documentation

## 2016-04-04 DIAGNOSIS — J45909 Unspecified asthma, uncomplicated: Secondary | ICD-10-CM | POA: Insufficient documentation

## 2016-04-04 DIAGNOSIS — Z79899 Other long term (current) drug therapy: Secondary | ICD-10-CM | POA: Insufficient documentation

## 2016-04-04 DIAGNOSIS — Y929 Unspecified place or not applicable: Secondary | ICD-10-CM | POA: Insufficient documentation

## 2016-04-04 DIAGNOSIS — S43015A Anterior dislocation of left humerus, initial encounter: Secondary | ICD-10-CM | POA: Insufficient documentation

## 2016-04-04 MED ORDER — KETAMINE HCL 10 MG/ML IJ SOLN: 50.0000 mg | Freq: Once | INTRAMUSCULAR | Status: DC

## 2016-04-04 MED ORDER — OXYCODONE-ACETAMINOPHEN 5-325 MG PO TABS
2.0000 | ORAL_TABLET | Freq: Once | ORAL | Status: AC
  Administered 2016-04-04: 2 via ORAL
  Filled 2016-04-04: qty 2

## 2016-04-04 MED ORDER — LIDOCAINE HCL (PF) 2 % IJ SOLN
INTRAMUSCULAR | Status: AC
  Filled 2016-04-04: qty 10

## 2016-04-04 MED ORDER — PROPOFOL 10 MG/ML IV BOLUS
INTRAVENOUS | Status: AC | PRN
  Administered 2016-04-04: 25 mg via INTRAVENOUS

## 2016-04-04 MED ORDER — LIDOCAINE HCL (PF) 1 % IJ SOLN
INTRAMUSCULAR | Status: AC
  Filled 2016-04-04: qty 5

## 2016-04-04 MED ORDER — OXYCODONE-ACETAMINOPHEN 5-325 MG PO TABS: 1.0000 | ORAL_TABLET | Freq: Four times a day (QID) | ORAL | Status: DC | PRN

## 2016-04-04 MED ORDER — LIDOCAINE HCL (PF) 1 % IJ SOLN
30.0000 mL | Freq: Once | INTRAMUSCULAR | Status: DC
  Filled 2016-04-04: qty 30

## 2016-04-04 MED ORDER — MORPHINE SULFATE (PF) 4 MG/ML IV SOLN
4.0000 mg | Freq: Once | INTRAVENOUS | Status: AC
  Administered 2016-04-04: 4 mg via INTRAVENOUS
  Filled 2016-04-04: qty 1

## 2016-04-04 MED ORDER — PROPOFOL 10 MG/ML IV BOLUS
1.0000 mg/kg | Freq: Once | INTRAVENOUS | Status: DC
  Filled 2016-04-04: qty 20

## 2016-04-04 MED ORDER — PROPOFOL 10 MG/ML IV BOLUS
INTRAVENOUS | Status: AC | PRN
  Administered 2016-04-04: 75 mg via INTRAVENOUS

## 2016-04-04 NOTE — Discharge Instructions (Signed)
Shoulder Dislocation °A shoulder dislocation happens when the upper arm bone (humerus) moves out of the shoulder joint. The shoulder joint is the part of the shoulder where the humerus, shoulder blade (scapula), and collarbone (clavicle) meet. °CAUSES °This condition is often caused by: °· A fall. °· A hit to the shoulder. °· A forceful movement of the shoulder. °RISK FACTORS °This condition is more likely to develop in people who play sports. °SYMPTOMS °Symptoms of this condition include: °· Deformity of the shoulder. °· Intense pain. °· Inability to move the shoulder. °· Numbness, weakness, or tingling in your neck or down your arm. °· Bruising or swelling around your shoulder. °DIAGNOSIS °This condition is diagnosed with a physical exam. After the exam, tests may be done to check for related problems. Tests that may be done include: °· X-ray. This may be done to check for broken bones. °· MRI. This may be done to check for damage to the tissues around the shoulder. °· Electromyogram. This may be done to check for nerve damage. °TREATMENT °This condition is treated with a procedure to place the humerus back in the joint. This procedure is called a reduction. There are two types of reduction: °· Closed reduction. In this procedure, the humerus is placed back in the joint without surgery. The health care provider uses his or her hands to guide the bone back into place. °· Open reduction. In this procedure, the humerus is placed back in the joint with surgery. An open reduction may be recommended if: °¨ You have a weak shoulder joint or weak ligaments. °¨ You have had more than one shoulder dislocation. °¨ The nerves or blood vessels around your shoulder have been damaged. °After the humerus is placed back into the joint, your arm will be placed in a splint or sling to prevent it from moving. You will need to wear the splint or sling until your shoulder heals. When the splint or sling is removed, you may have  physical therapy to help improve the range of motion in your shoulder joint. °HOME CARE INSTRUCTIONS °If You Have a Splint or Sling: °· Wear it as told by your health care provider. Remove it only as told by your health care provider. °· Loosen it if your fingers become numb and tingle, or if they turn cold and blue. °· Keep it clean and dry. °Bathing °· Do not take baths, swim, or use a hot tub until your health care provider approves. Ask your health care provider if you can take showers. You may only be allowed to take sponge baths for bathing. °· If your health care provider approves bathing and showering, cover your splint or sling with a watertight plastic bag to protect it from water. Do not let the splint or sling get wet. °Managing Pain, Stiffness, and Swelling °· If directed, apply ice to the injured area. °¨ Put ice in a plastic bag. °¨ Place a towel between your skin and the bag. °¨ Leave the ice on for 20 minutes, 2-3 times per day. °· Move your fingers often to avoid stiffness and to decrease swelling. °· Raise (elevate) the injured area above the level of your heart while you are sitting or lying down. °Driving °· Do not drive while wearing a splint or sling on a hand that you use for driving. °· Do not drive or operate heavy machinery while taking pain medicine. °Activity °· Return to your normal activities as told by your health care provider. Ask your   health care provider what activities are safe for you. °· Perform range-of-motion exercises only as told by your health care provider. °· Exercise your hand by squeezing a soft ball. This helps to decrease stiffness and swelling in your hand and wrist. °General Instructions °· Take over-the-counter and prescription medicines only as told by your health care provider. °· Do not use any tobacco products, including cigarettes, chewing tobacco, or e-cigarettes. Tobacco can delay bone and tissue healing. If you need help quitting, ask your health care  provider. °· Keep all follow-up visits as told by your health care provider. This is important. °SEEK MEDICAL CARE IF: °· Your splint or sling gets damaged. °SEEK IMMEDIATE MEDICAL CARE IF: °· Your pain gets worse rather than better. °· You lose feeling in your arm or hand. °· Your arm or hand becomes white and cold. °  °This information is not intended to replace advice given to you by your health care provider. Make sure you discuss any questions you have with your health care provider. °  °Document Released: 06/28/2001 Document Revised: 06/24/2015 Document Reviewed: 01/26/2015 °Elsevier Interactive Patient Education ©2016 [employer] ° °

## 2016-04-04 NOTE — ED Provider Notes (Signed)
Pt improved Shoulder is now reduced He is awake/alert, ambulatory   Zadie Rhineonald Jaxin Fulfer, MD 04/04/16 [phone]

## 2016-04-04 NOTE — ED Provider Notes (Signed)
CSN: [id number]     Arrival date & time 04/04/16  7829 History   First MD Initiated Contact with Patient 04/04/16 0546     Chief Complaint  Patient presents with  . Shoulder Injury     (Consider location/radiation/quality/duration/timing/severity/associated sxs/prior Treatment) HPI  This a 50 year old male with a history of asthma, kidney stones, and substance abuse who presents following a fall. Patient states that he slipped and fell off of a stool. He landed on his left shoulder. He denies hitting his head or loss of consciousness. He has been ambulatory. He reports 10 out of 10 pain in the left shoulder. Denies any tingling in the hand. Reports decreased range of motion.  Past Medical History  Diagnosis Date  . Asthma     as child-now with allergy season only.  . Kidney calculi    Past Surgical History  Procedure Laterality Date  . Back surgery    . I&d extremity  10/24/2012    Procedure: IRRIGATION AND DEBRIDEMENT EXTREMITY;  Surgeon: Tami Ribas, MD;  Location: [facility] OR;  Service: Orthopedics;  Laterality: Right;  . Open reduction internal fixation (orif) hand Right thumb  . Hydrocele excision Left 08/01/2013    Procedure: HYDROCELECTOMY ADULT;  Surgeon: Ky Barban, MD;  Location: [facility] ORS;  Service: Urology;  Laterality: Left;  . Thumb fusion Right   . Open reduction internal fixation (orif) distal radial fracture Right 05/16/2015    Procedure: OPEN REDUCTION INTERNAL FIXATION (ORIF) RIGHT DISTAL RADIUS FRACTURE;  Surgeon: Dairl Ponder, MD;  Location: [facility] OR;  Service: Orthopedics;  Laterality: Right;  . Open reduction internal fixation (orif) metacarpal Left 05/16/2015    Procedure: OPEN REDUCTION INTERNAL FIXATION OF MIDDLE PHALANGEAL FRACTURE, EXTENSOR TENDON REPAIR WITH ULNAR DIGITAL NERVE REPAIR MICROSCOPICALLY;  Surgeon: Dairl Ponder, MD;  Location: [facility] OR;  Service: Orthopedics;  Laterality: Left;   Family History  Problem Relation Age of Onset  . Diabetes  Mother   . Hypertension Mother   . Obesity Mother   . Heart disease Mother   . Kidney disease Mother   . COPD Mother   . Cancer Father    Social History  Substance Use Topics  . Smoking status: Current Every Day Smoker -- 0.25 packs/day for 44 years    Types: Cigarettes  . Smokeless tobacco: Former Neurosurgeon    Types: Chew  . Alcohol Use: No     Comment: hx heavy etoh august 2016    Review of Systems  Musculoskeletal: Negative for back pain and neck pain.       Left shoulder pain  All other systems reviewed and are negative.     Allergies  Penicillins  Home Medications   Prior to Admission medications   Medication Sig Start Date End Date Taking? Authorizing Provider  albuterol (PROVENTIL HFA;VENTOLIN HFA) 108 (90 BASE) MCG/ACT inhaler Inhale 2 puffs into the lungs every 6 (six) hours as needed for wheezing or shortness of breath.   Yes Historical Provider, MD  atorvastatin (LIPITOR) 20 MG tablet Take 1 tablet (20 mg total) by mouth daily. 02/10/16  Yes [name], PA-C  DULoxetine (CYMBALTA) 60 MG capsule Take 60 mg by mouth daily.   Yes Historical Provider, MD  HYDROcodone-acetaminophen (NORCO/VICODIN) 5-325 MG tablet Take 1 tablet by mouth every 4 (four) hours as needed for moderate pain (Must last 15 days.Do not take and drive a car or use machinery.). 03/28/16  Yes [name], MD  traZODone (DESYREL) 100 MG tablet Take 100 mg  by mouth at bedtime.   Yes Historical Provider, MD  oxyCODONE-acetaminophen (PERCOCET/ROXICET) 5-325 MG tablet Take 1 tablet by mouth every 6 (six) hours as needed for severe pain. 04/04/16   Shon Baton, MD   BP 129/95 mmHg  Pulse 99  Temp(Src) 98 F (36.7 C) (Oral)  Resp 16  Ht 5\' 7"  (1.702 m)  Wt 185 lb (83.915 kg)  BMI 28.97 kg/m2  SpO2 98% Physical Exam  Constitutional: He is oriented to person, place, and time. He appears well-developed and well-nourished.  Uncomfortable appearing  HENT:  Head: Normocephalic and atraumatic.   Cardiovascular: Normal rate, regular rhythm and normal heart sounds.   No murmur heard. Pulmonary/Chest: Effort normal and breath sounds normal. No respiratory distress. He has no wheezes.  Abdominal: Soft. There is no tenderness.  Musculoskeletal: He exhibits no edema.  No tenderness to palpation or deformity noted over the left clavicle, limited range of motion of the left shoulder with humerus dislocated from the glenoid fossa, 2+ radial pulse  Neurological: He is alert and oriented to person, place, and time.  Skin: Skin is warm and dry.  Psychiatric: He has a normal mood and affect.  Nursing note and vitals reviewed.   ED Course  Procedures (including critical care time)  Procedural sedation Performed by: Shon Baton Consent: Verbal consent obtained. Risks and benefits: risks, benefits and alternatives were discussed Required items: required blood products, implants, devices, and special equipment available Patient identity confirmed: arm band and provided demographic data Time out: Immediately prior to procedure a "time out" was called to verify the correct patient, procedure, equipment, support staff and site/side marked as required.  Sedation type: moderate (conscious) sedation NPO time confirmed and considedered  Sedatives: PROPOFOL  Physician Time at Bedside: 25 min  Vitals: Vital signs were monitored during sedation. Cardiac Monitor, pulse oximeter Patient tolerance: Patient tolerated the procedure well with no immediate complications. Comments: Pt with uneventful recovered. Returned to pre-procedural sedation baseline     Reduction of dislocation Date/Time: 12:42 AM Performed by: Shon Baton Authorized by: Shon Baton Consent: Verbal consent obtained. Risks and benefits: risks, benefits and alternatives were discussed Consent given by: patient Required items: required blood products, implants, devices, and special equipment  available Time out: Immediately prior to procedure a "time out" was called to verify the correct patient, procedure, equipment, support staff and site/side marked as required.  Patient sedated: yes  Vitals: Vital signs were monitored during sedation. Patient tolerance: Patient tolerated the procedure well with no immediate complications. Joint: left shoulder Reduction technique: fares and traction/counter traction.   Labs Review Labs Reviewed - No data to display  Imaging Review Dg Shoulder Left  04/04/2016  CLINICAL DATA:  50 year old male with fall and left shoulder pain. EXAM: LEFT SHOULDER - 2+ VIEW COMPARISON:  None. FINDINGS: There is anterior dislocation of the left shoulder. There is apparent impaction of the posterior lateral aspect of the humeral head to the inferior glenoid rim. An associated Hill-Sachs or Bankart lesion are not excluded. Clinical correlation is recommended. The soft tissues appear unremarkable. IMPRESSION: Anterior dislocation of the left shoulder. Electronically Signed   By: Elgie Collard M.D.   On: 04/04/2016 06:33   Dg Shoulder Left Port  04/04/2016  CLINICAL DATA:  Shoulder dislocation, postreduction EXAM: LEFT SHOULDER - 1 VIEW COMPARISON:  04/04/2016 FINDINGS: Limited two-view portable exam. There is improved alignment of the humeral head in relation to glenoid. AP portable view is limited because of projection. Scapula  Y-view demonstrates the humeral head over the glenoid. No fracture evident. AC joint aligned. Left lung is clear. IMPRESSION: Limited exam but improved alignment following close reduction. Electronically Signed   By: Judie PetitM.  Shick M.D.   On: 04/04/2016 08:10   I have personally reviewed and evaluated these images and lab results as part of my medical decision-making.   EKG Interpretation None      MDM   Final diagnoses:  Anterior shoulder dislocation, left, initial encounter    Patient presents with left shoulder injury. Otherwise  nontoxic. ABCs intact. Shoulder clinically appears dislocated anteriorly. X-ray confirms anterior dislocation with questionable Hill-Sachs or Bankhart lesion. Initially, shoulder was injected with lidocaine and patient was given pain medication. Unable to reduce. Subsequently, patient was sedated with propofol. No immediate complications. Repeat x-ray shows improved alignment with closed reduction and no evidence of fracture. Patient is on chronic pain medication at home. He states the hydrocodone does not work for him. Given his acute injury, I will give him a 1 day prescription for Percocet for breakthrough pain. Follow-up with Dr. Hilda LiasKeeling.  After history, exam, and medical workup I feel the patient has been appropriately medically screened and is safe for discharge home. Pertinent diagnoses were discussed with the patient. Patient was given return precautions.     Shon Batonourtney F Square Jowett, MD 04/05/16 [phone]

## 2016-04-04 NOTE — ED Notes (Signed)
Patient was cleaning off the top of a car standing on bar stool, stool came from under him and he fell to ground.

## 2016-04-04 NOTE — ED Notes (Signed)
Per Dr. Bebe ShaggyWickline, pt is ok to be discharged independently to walk home.

## 2016-04-07 ENCOUNTER — Emergency Department (HOSPITAL_COMMUNITY): Payer: Self-pay

## 2016-04-07 ENCOUNTER — Emergency Department (HOSPITAL_COMMUNITY)
Admission: EM | Admit: 2016-04-07 | Discharge: 2016-04-07 | Disposition: A | Discharge status: Home / Self Care | Payer: Self-pay | Attending: Emergency Medicine | Admitting: Emergency Medicine

## 2016-04-07 ENCOUNTER — Encounter (HOSPITAL_COMMUNITY): Payer: Self-pay | Admitting: Emergency Medicine

## 2016-04-07 DIAGNOSIS — Y939 Activity, unspecified: Secondary | ICD-10-CM | POA: Insufficient documentation

## 2016-04-07 DIAGNOSIS — Z79899 Other long term (current) drug therapy: Secondary | ICD-10-CM | POA: Insufficient documentation

## 2016-04-07 DIAGNOSIS — S43015A Anterior dislocation of left humerus, initial encounter: Secondary | ICD-10-CM | POA: Insufficient documentation

## 2016-04-07 DIAGNOSIS — Y999 Unspecified external cause status: Secondary | ICD-10-CM | POA: Insufficient documentation

## 2016-04-07 DIAGNOSIS — F17210 Nicotine dependence, cigarettes, uncomplicated: Secondary | ICD-10-CM | POA: Insufficient documentation

## 2016-04-07 DIAGNOSIS — W08XXXA Fall from other furniture, initial encounter: Secondary | ICD-10-CM | POA: Insufficient documentation

## 2016-04-07 DIAGNOSIS — Y9259 Other trade areas as the place of occurrence of the external cause: Secondary | ICD-10-CM | POA: Insufficient documentation

## 2016-04-07 DIAGNOSIS — J45909 Unspecified asthma, uncomplicated: Secondary | ICD-10-CM | POA: Insufficient documentation

## 2016-04-07 MED ORDER — DIAZEPAM 5 MG/ML IJ SOLN
5.0000 mg | Freq: Once | INTRAMUSCULAR | Status: AC
  Administered 2016-04-07: 5 mg via INTRAVENOUS
  Filled 2016-04-07: qty 2

## 2016-04-07 MED ORDER — KETAMINE HCL 10 MG/ML IJ SOLN
INTRAMUSCULAR | Status: AC | PRN
  Administered 2016-04-07: 30 mg via INTRAVENOUS

## 2016-04-07 MED ORDER — KETAMINE HCL 10 MG/ML IJ SOLN
INTRAMUSCULAR | Status: AC | PRN
  Administered 2016-04-07: 34 mg via INTRAVENOUS

## 2016-04-07 MED ORDER — PROPOFOL 10 MG/ML IV BOLUS
1.0000 mg/kg | Freq: Once | INTRAVENOUS | Status: DC
  Filled 2016-04-07: qty 20

## 2016-04-07 MED ORDER — PROPOFOL 10 MG/ML IV BOLUS
INTRAVENOUS | Status: AC | PRN
  Administered 2016-04-07: 30 mg via INTRAVENOUS

## 2016-04-07 MED ORDER — HYDROMORPHONE HCL 1 MG/ML IJ SOLN
1.0000 mg | Freq: Once | INTRAMUSCULAR | Status: AC
  Administered 2016-04-07: 1 mg via INTRAVENOUS
  Filled 2016-04-07: qty 1

## 2016-04-07 MED ORDER — PROPOFOL 10 MG/ML IV BOLUS
INTRAVENOUS | Status: AC | PRN
  Administered 2016-04-07: 34 mg via INTRAVENOUS

## 2016-04-07 MED ORDER — GABAPENTIN 300 MG PO CAPS
300.0000 mg | ORAL_CAPSULE | Freq: Once | ORAL | Status: AC
  Administered 2016-04-07: 300 mg via ORAL
  Filled 2016-04-07: qty 1

## 2016-04-07 MED ORDER — KETAMINE HCL 10 MG/ML IJ SOLN
1.0000 mg/kg | Freq: Once | INTRAMUSCULAR | Status: DC
  Filled 2016-04-07: qty 1

## 2016-04-07 MED ORDER — KETOROLAC TROMETHAMINE 30 MG/ML IJ SOLN
15.0000 mg | Freq: Once | INTRAMUSCULAR | Status: AC
  Administered 2016-04-07: 15 mg via INTRAVENOUS
  Filled 2016-04-07: qty 1

## 2016-04-07 MED ORDER — HYDROMORPHONE HCL 2 MG/ML IJ SOLN: 2.0000 mg | Freq: Once | INTRAMUSCULAR | Status: DC

## 2016-04-07 MED ORDER — KETAMINE HCL 10 MG/ML IJ SOLN
INTRAMUSCULAR | Status: AC | PRN
  Administered 2016-04-07: 20 mg via INTRAVENOUS

## 2016-04-07 MED ORDER — ONDANSETRON HCL 4 MG/2ML IJ SOLN
4.0000 mg | Freq: Once | INTRAMUSCULAR | Status: AC
  Administered 2016-04-07: 4 mg via INTRAVENOUS
  Filled 2016-04-07: qty 2

## 2016-04-07 NOTE — ED Provider Notes (Signed)
CSN: [id number]     Arrival date & time 04/07/16  1324 History   First MD Initiated Contact with Patient 04/07/16 1442     Chief Complaint  Patient presents with  . Arm Pain  . Back Pain     (Consider location/radiation/quality/duration/timing/severity/associated sxs/prior Treatment) HPI   50 year old male with left shoulder pain. Recent evaluation in the emergency room with a left anterior shoulder dislocation which was reduced. Reports that shortly after he was discharged. Have an increase in pain again. It has been persistent. Worse with movement. His range of motion is severely limited secondary to pain. No acute numbness or tingling. Denies any interim trauma.  Past Medical History  Diagnosis Date  . Asthma     as child-now with allergy season only.  . Kidney calculi    Past Surgical History  Procedure Laterality Date  . Back surgery    . I&d extremity  10/24/2012    Procedure: IRRIGATION AND DEBRIDEMENT EXTREMITY;  Surgeon: Tami Ribas, MD;  Location: [facility] OR;  Service: Orthopedics;  Laterality: Right;  . Open reduction internal fixation (orif) hand Right thumb  . Hydrocele excision Left 08/01/2013    Procedure: HYDROCELECTOMY ADULT;  Surgeon: Ky Barban, MD;  Location: [facility] ORS;  Service: Urology;  Laterality: Left;  . Thumb fusion Right   . Open reduction internal fixation (orif) distal radial fracture Right 05/16/2015    Procedure: OPEN REDUCTION INTERNAL FIXATION (ORIF) RIGHT DISTAL RADIUS FRACTURE;  Surgeon: Dairl Ponder, MD;  Location: [facility] OR;  Service: Orthopedics;  Laterality: Right;  . Open reduction internal fixation (orif) metacarpal Left 05/16/2015    Procedure: OPEN REDUCTION INTERNAL FIXATION OF MIDDLE PHALANGEAL FRACTURE, EXTENSOR TENDON REPAIR WITH ULNAR DIGITAL NERVE REPAIR MICROSCOPICALLY;  Surgeon: Dairl Ponder, MD;  Location: [facility] OR;  Service: Orthopedics;  Laterality: Left;   Family History  Problem Relation Age of Onset  . Diabetes Mother   .  Hypertension Mother   . Obesity Mother   . Heart disease Mother   . Kidney disease Mother   . COPD Mother   . Cancer Father    Social History  Substance Use Topics  . Smoking status: Current Every Day Smoker -- 0.25 packs/day for 44 years    Types: Cigarettes  . Smokeless tobacco: Former Neurosurgeon    Types: Chew  . Alcohol Use: No     Comment: hx heavy etoh august 2016    Review of Systems  All systems reviewed and negative, other than as noted in HPI.   Allergies  Penicillins  Home Medications   Prior to Admission medications   Medication Sig Start Date End Date Taking? Authorizing Provider  albuterol (PROVENTIL HFA;VENTOLIN HFA) 108 (90 BASE) MCG/ACT inhaler Inhale 2 puffs into the lungs every 6 (six) hours as needed for wheezing or shortness of breath.   Yes Historical Provider, MD  atorvastatin (LIPITOR) 20 MG tablet Take 1 tablet (20 mg total) by mouth daily. 02/10/16  Yes [name], PA-C  DULoxetine (CYMBALTA) 60 MG capsule Take 60 mg by mouth daily.   Yes Historical Provider, MD  HYDROcodone-acetaminophen (NORCO/VICODIN) 5-325 MG tablet Take 1 tablet by mouth every 4 (four) hours as needed for moderate pain (Must last 15 days.Do not take and drive a car or use machinery.). 03/28/16  Yes [name], MD  Multiple Vitamin (MULTIVITAMIN WITH MINERALS) TABS tablet Take 1 tablet by mouth daily.   Yes Historical Provider, MD  traZODone (DESYREL) 100 MG tablet Take 100 mg by mouth  at bedtime.   Yes Historical Provider, MD  oxyCODONE-acetaminophen (PERCOCET/ROXICET) 5-325 MG tablet Take 1 tablet by mouth every 6 (six) hours as needed for severe pain. 04/04/16   Shon Batonourtney F Horton, MD   BP 126/98 mmHg  Pulse 104  Temp(Src) 98.7 F (37.1 C) (Temporal)  Resp 22  SpO2 98% Physical Exam  Constitutional: He appears well-developed and well-nourished. No distress.  HENT:  Head: Normocephalic and atraumatic.  Eyes: Conjunctivae are normal. Right eye exhibits no discharge. Left  eye exhibits no discharge.  Neck: Neck supple.  Cardiovascular: Normal rate, regular rhythm and normal heart sounds.  Exam reveals no gallop and no friction rub.   No murmur heard. Pulmonary/Chest: Effort normal and breath sounds normal. No respiratory distress.  Abdominal: Soft. He exhibits no distension. There is no tenderness.  Musculoskeletal:  "Squared off" appearance of left shoulder. Tenderness to the humeral head and anterior shoulder. Sniffily decreased range of motion secondary to pain. Neurovascularly intact. Multiple abrasions noted to bilateral upper lower extremities.  Neurological: He is alert.  Skin: Skin is warm and dry.  Psychiatric: He has a normal mood and affect. His behavior is normal. Thought content normal.  Nursing note and vitals reviewed.   ED Course  Reduction of dislocation Date/Time: 04/07/2016 5:00 PM Performed by: Raeford RazorKOHUT, Laronn Devonshire Authorized by: Raeford RazorKOHUT, Jerelene Salaam Consent: Verbal consent obtained. Written consent obtained. Risks and benefits: risks, benefits and alternatives were discussed Consent given by: patient Time out: Immediately prior to procedure a "time out" was called to verify the correct patient, procedure, equipment, support staff and site/side marked as required. Preparation: Patient was prepped and draped in the usual sterile fashion. Local anesthesia used: no Patient sedated: yes Sedation type: moderate (conscious) sedation Vitals: Vital signs were monitored during sedation. Patient tolerance: Patient tolerated the procedure well with no immediate complications Comments: Left anterior shoulder dislocation reduced with external rotation and traction.   (including critical care time)  Procedural sedation Performed by: Raeford RazorKOHUT, Liban Guedes Consent: Verbal consent obtained. Risks and benefits: risks, benefits and alternatives were discussed Required items: required blood products, implants, devices, and special equipment available Patient identity  confirmed: arm band and provided demographic data Time out: Immediately prior to procedure a "time out" was called to verify the correct patient, procedure, equipment, support staff and site/side marked as required.  Sedation type: moderate (conscious) sedation NPO time confirmed and considedered  Sedatives: PROPOFOL and Ketamine  Physician Time at Bedside: 35 minutes  Vitals: Vital signs were monitored during sedation. Cardiac Monitor, pulse oximeter Patient tolerance: Patient tolerated the procedure well with no immediate complications. Comments: Pt with uneventful recovered. Returned to pre-procedural sedation baseline    Labs Review Labs Reviewed - No data to display  Imaging Review Dg Shoulder Left  04/07/2016  CLINICAL DATA:  Left shoulder pain. Patient states dislocation 04/04/2016. EXAM: LEFT SHOULDER - 2+ VIEW COMPARISON:  04/04/2016 FINDINGS: Examination demonstrates recurrent anterior shoulder dislocation with 1.5 cm fragment adjacent the glenoid fossa likely due to Hill-Sachs injury as there is mild irregularity over the humeral head. Remainder the exam is unchanged. IMPRESSION: Recurrent anterior shoulder dislocation. 1.5 cm bone fragment adjacent the glenoid fossa likely a Hill-Sachs lesion from humeral head donor site. Electronically Signed   By: Elberta Fortisaniel  Boyle M.D.   On: 04/07/2016 14:08   I have personally reviewed and evaluated these images and lab results as part of my medical decision-making.   EKG Interpretation None      MDM   Final diagnoses:  Anterior shoulder  dislocation, left, initial encounter    50 year old male with anterior left shoulder dislocation. This was reduced. Placed in sling. Recommended orthopedic follow-up.    Raeford RazorStephen Shawnay Bramel, MD 04/13/16 1450

## 2016-04-07 NOTE — ED Notes (Addendum)
Pt reports falling off a barstool 3 days ago in garage. States he was seen in ED after event and had a LT shoulder reduction under conscious sedation. Pt reports increase in pain to LT shoulder and arm. Pt also reports numbness and tingling to LT arm x 2 days and lower back pain.

## 2016-04-07 NOTE — ED Notes (Signed)
Pt wanted to speak with doctor before discharge, EDP aware.

## 2016-04-07 NOTE — Discharge Instructions (Signed)
Shoulder Dislocation °A shoulder dislocation happens when the upper arm bone (humerus) moves out of the shoulder joint. The shoulder joint is the part of the shoulder where the humerus, shoulder blade (scapula), and collarbone (clavicle) meet. °CAUSES °This condition is often caused by: °· A fall. °· A hit to the shoulder. °· A forceful movement of the shoulder. °RISK FACTORS °This condition is more likely to develop in people who play sports. °SYMPTOMS °Symptoms of this condition include: °· Deformity of the shoulder. °· Intense pain. °· Inability to move the shoulder. °· Numbness, weakness, or tingling in your neck or down your arm. °· Bruising or swelling around your shoulder. °DIAGNOSIS °This condition is diagnosed with a physical exam. After the exam, tests may be done to check for related problems. Tests that may be done include: °· X-ray. This may be done to check for broken bones. °· MRI. This may be done to check for damage to the tissues around the shoulder. °· Electromyogram. This may be done to check for nerve damage. °TREATMENT °This condition is treated with a procedure to place the humerus back in the joint. This procedure is called a reduction. There are two types of reduction: °· Closed reduction. In this procedure, the humerus is placed back in the joint without surgery. The health care provider uses his or her hands to guide the bone back into place. °· Open reduction. In this procedure, the humerus is placed back in the joint with surgery. An open reduction may be recommended if: °¨ You have a weak shoulder joint or weak ligaments. °¨ You have had more than one shoulder dislocation. °¨ The nerves or blood vessels around your shoulder have been damaged. °After the humerus is placed back into the joint, your arm will be placed in a splint or sling to prevent it from moving. You will need to wear the splint or sling until your shoulder heals. When the splint or sling is removed, you may have  physical therapy to help improve the range of motion in your shoulder joint. °HOME CARE INSTRUCTIONS °If You Have a Splint or Sling: °· Wear it as told by your health care provider. Remove it only as told by your health care provider. °· Loosen it if your fingers become numb and tingle, or if they turn cold and blue. °· Keep it clean and dry. °Bathing °· Do not take baths, swim, or use a hot tub until your health care provider approves. Ask your health care provider if you can take showers. You may only be allowed to take sponge baths for bathing. °· If your health care provider approves bathing and showering, cover your splint or sling with a watertight plastic bag to protect it from water. Do not let the splint or sling get wet. °Managing Pain, Stiffness, and Swelling °· If directed, apply ice to the injured area. °¨ Put ice in a plastic bag. °¨ Place a towel between your skin and the bag. °¨ Leave the ice on for 20 minutes, 2-3 times per day. °· Move your fingers often to avoid stiffness and to decrease swelling. °· Raise (elevate) the injured area above the level of your heart while you are sitting or lying down. °Driving °· Do not drive while wearing a splint or sling on a hand that you use for driving. °· Do not drive or operate heavy machinery while taking pain medicine. °Activity °· Return to your normal activities as told by your health care provider. Ask your   health care provider what activities are safe for you. °· Perform range-of-motion exercises only as told by your health care provider. °· Exercise your hand by squeezing a soft ball. This helps to decrease stiffness and swelling in your hand and wrist. °General Instructions °· Take over-the-counter and prescription medicines only as told by your health care provider. °· Do not use any tobacco products, including cigarettes, chewing tobacco, or e-cigarettes. Tobacco can delay bone and tissue healing. If you need help quitting, ask your health care  provider. °· Keep all follow-up visits as told by your health care provider. This is important. °SEEK MEDICAL CARE IF: °· Your splint or sling gets damaged. °SEEK IMMEDIATE MEDICAL CARE IF: °· Your pain gets worse rather than better. °· You lose feeling in your arm or hand. °· Your arm or hand becomes white and cold. °  °This information is not intended to replace advice given to you by your health care provider. Make sure you discuss any questions you have with your health care provider. °  °Document Released: 06/28/2001 Document Revised: 06/24/2015 Document Reviewed: 01/26/2015 °Elsevier Interactive Patient Education ©2016 [employer] ° °

## 2016-04-07 NOTE — ED Notes (Signed)
Pt c/o numbness from left upper arm down to fingers. MD notified.

## 2016-04-07 NOTE — ED Notes (Signed)
Informed pt that if he left the facility by himself I could not discharge him until 2200, but if he could get a ride home he could leave at any time.

## 2016-04-07 NOTE — ED Notes (Signed)
Patient given water per RN approval. 

## 2016-04-07 NOTE — ED Notes (Signed)
MD at bedside. 

## 2016-04-12 ENCOUNTER — Ambulatory Visit (INDEPENDENT_AMBULATORY_CARE_PROVIDER_SITE_OTHER): Payer: Self-pay | Admitting: Orthopaedic Surgery

## 2016-04-12 ENCOUNTER — Encounter: Payer: Self-pay | Admitting: Orthopaedic Surgery

## 2016-04-12 VITALS — BP 128/84 | HR 97 | Temp 97.0°F | Ht 67.0 in | Wt 183.0 lb

## 2016-04-12 DIAGNOSIS — S43015D Anterior dislocation of left humerus, subsequent encounter: Secondary | ICD-10-CM

## 2016-04-12 DIAGNOSIS — M25512 Pain in left shoulder: Secondary | ICD-10-CM

## 2016-04-12 MED ORDER — HYDROCODONE-ACETAMINOPHEN 5-325 MG PO TABS: 1.0000 | ORAL_TABLET | ORAL | Status: DC | PRN

## 2016-04-12 NOTE — Progress Notes (Signed)
Patient XB:JYNWG:Jacob Reilly, male DOB:10/24/1965, 50 y.o. NFA:[id number]  Chief Complaint  Patient presents with  . Shoulder Injury    dislocated shoulder    HPI  Jacob Reilly is a 50 y.o. male who has new problem:  Dislocation of the left shoulder anteriorly times two.   He has never had dislocation before.  He fell at home with an outstretched arm on 04-04-16 and hurt his shoulder. He presented to the ER and had x-rays showing dislocation of the shoulder on the left.  He had relocation of the shoulder.  He came back to the ER on 04-07-16 with recurrent dislocation.  It came out during his sleep.  It was reduced again.  I have reviewed both ER record,  All x-rays and x-ray reports.  I have discussed the findings with him including the small bony fragment from the dislocations that is outside of the joint.  He will need to continue his sling and swathe.  He took off the swathe part and I made a new one for him  I will see him in one week.  He will get x-rays then.  HPI  Body mass index is 28.66 kg/(m^2).  ROS  Review of Systems  HENT: Negative for congestion.   Respiratory: Positive for shortness of breath. Negative for cough.   Cardiovascular: Negative for chest pain and leg swelling.  Endocrine: Negative for cold intolerance.  Musculoskeletal: Positive for myalgias, back pain and arthralgias.  Allergic/Immunologic: Negative for environmental allergies.  Psychiatric/Behavioral: The patient is nervous/anxious.     Past Medical History  Diagnosis Date  . Asthma     as child-now with allergy season only.  . Kidney calculi     Past Surgical History  Procedure Laterality Date  . Back surgery    . I&d extremity  10/24/2012    Procedure: IRRIGATION AND DEBRIDEMENT EXTREMITY;  Surgeon: Tami RibasKevin R Kuzma, MD;  [facility];  Service: Orthopedics;  Laterality: Right;  . Open reduction internal fixation (orif) hand Right thumb  . Hydrocele excision Left 08/01/2013    Procedure:  HYDROCELECTOMY ADULT;  Surgeon: Ky BarbanMohammad I Javaid, MD;  Location: [facility];  Service: Urology;  Laterality: Left;  . Thumb fusion Right   . Open reduction internal fixation (orif) distal radial fracture Right 05/16/2015    Procedure: OPEN REDUCTION INTERNAL FIXATION (ORIF) RIGHT DISTAL RADIUS FRACTURE;  Surgeon: Dairl PonderMatthew Weingold, MD;  Location: [facility] OR;  Service: Orthopedics;  Laterality: Right;  . Open reduction internal fixation (orif) metacarpal Left 05/16/2015    Procedure: OPEN REDUCTION INTERNAL FIXATION OF MIDDLE PHALANGEAL FRACTURE, EXTENSOR TENDON REPAIR WITH ULNAR DIGITAL NERVE REPAIR MICROSCOPICALLY;  Surgeon: Dairl PonderMatthew Weingold, MD;  Location: [facility] OR;  Service: Orthopedics;  Laterality: Left;    Family History  Problem Relation Age of Onset  . Diabetes Mother   . Hypertension Mother   . Obesity Mother   . Heart disease Mother   . Kidney disease Mother   . COPD Mother   . Cancer Father     Social History Social History  Substance Use Topics  . Smoking status: Current Every Day Smoker -- 0.25 packs/day for 44 years    Types: Cigarettes  . Smokeless tobacco: Former NeurosurgeonUser    Types: Chew  . Alcohol Use: No     Comment: hx heavy etoh august 2016    Allergies  Allergen Reactions  . Penicillins Other (See Comments)    Childhood Allergy.  Has patient had a PCN reaction causing immediate rash, facial/tongue/throat swelling,  SOB or lightheadedness with hypotension: unknown Has patient had a PCN reaction causing severe rash involving mucus membranes or skin necrosis: unknown Has patient had a PCN reaction that required hospitalization unknown Has patient had a PCN reaction occurring within the last 10 years: unknown If all of the above answers are "NO", then may proceed with Cephalosporin use.     Current Outpatient Prescriptions  Medication Sig Dispense Refill  . albuterol (PROVENTIL HFA;VENTOLIN HFA) 108 (90 BASE) MCG/ACT inhaler Inhale 2 puffs into the lungs every 6 (six) hours  as needed for wheezing or shortness of breath.    Marland Kitchen. atorvastatin (LIPITOR) 20 MG tablet Take 1 tablet (20 mg total) by mouth daily. 90 tablet 1  . DULoxetine (CYMBALTA) 60 MG capsule Take 60 mg by mouth daily.    Marland Kitchen. HYDROcodone-acetaminophen (NORCO/VICODIN) 5-325 MG tablet Take 1 tablet by mouth every 4 (four) hours as needed for moderate pain (Must last 15 days.Do not take and drive a car or use machinery.). 60 tablet 0  . Multiple Vitamin (MULTIVITAMIN WITH MINERALS) TABS tablet Take 1 tablet by mouth daily.    Marland Kitchen. oxyCODONE-acetaminophen (PERCOCET/ROXICET) 5-325 MG tablet Take 1 tablet by mouth every 6 (six) hours as needed for severe pain. 4 tablet 0  . traZODone (DESYREL) 100 MG tablet Take 100 mg by mouth at bedtime.     No current facility-administered medications for this visit.     Physical Exam  Blood pressure 128/84, pulse 97, temperature 97 F (36.1 C), height 5\' 7"  (1.702 m), weight 183 lb (83.008 kg).  Constitutional: overall normal hygiene, normal nutrition, well developed, normal grooming, normal body habitus. Assistive device:sling  Musculoskeletal: gait and station Limp none, muscle tone and strength are normal, no tremors or atrophy is present.  .  Neurological: coordination overall normal.  Deep tendon reflex/nerve stretch intact.  Sensation normal.  Cranial nerves II-XII intact.   Skin:   normal overall no scars, lesions, ulcers or rashes. No psoriasis.  Psychiatric: Alert and oriented x 3.  Recent memory intact, remote memory unclear.  Normal mood and affect. Well groomed.  Good eye contact.  Cardiovascular: overall no swelling, no varicosities, no edema bilaterally, normal temperatures of the legs and arms, no clubbing, cyanosis and good capillary refill.  Lymphatic: palpation is normal.  He has pain of the left shoulder.  I did not attempt any motion secondary to the two dislocations he has had just in the last week.  NV is intact.  He has some decreased  sensation around the lateral shoulder.  Neck is normal and right shoulder is normal.  The patient has been educated about the nature of the problem(s) and counseled on treatment options.  The patient appeared to understand what I have discussed and is in agreement with it.  Encounter Diagnoses  Name Primary?  . Left shoulder pain Yes  . Anterior dislocation of left shoulder, subsequent encounter     PLAN Call if any problems.  Precautions discussed.  Continue current medications.   Return to clinic 8 days  Continue sling and swathe.  Electronically Signed Darreld McleanWayne Cebastian Neis, MD 6/27/201711:43 AM

## 2016-04-15 ENCOUNTER — Other Ambulatory Visit: Payer: Self-pay | Admitting: Specialist

## 2016-04-15 DIAGNOSIS — Z87310 Personal history of (healed) osteoporosis fracture: Secondary | ICD-10-CM

## 2016-04-18 ENCOUNTER — Other Ambulatory Visit: Payer: Self-pay | Admitting: Specialist

## 2016-04-18 DIAGNOSIS — M546 Pain in thoracic spine: Secondary | ICD-10-CM

## 2016-04-18 DIAGNOSIS — G8929 Other chronic pain: Secondary | ICD-10-CM

## 2016-04-18 DIAGNOSIS — M545 Low back pain: Secondary | ICD-10-CM

## 2016-04-19 ENCOUNTER — Emergency Department (HOSPITAL_COMMUNITY): Payer: Self-pay

## 2016-04-19 ENCOUNTER — Emergency Department (HOSPITAL_COMMUNITY)
Admission: EM | Admit: 2016-04-19 | Discharge: 2016-04-20 | Disposition: A | Discharge status: Home / Self Care | Payer: Self-pay | Attending: Emergency Medicine | Admitting: Emergency Medicine

## 2016-04-19 ENCOUNTER — Encounter (HOSPITAL_COMMUNITY): Payer: Self-pay | Admitting: *Deleted

## 2016-04-19 DIAGNOSIS — S8002XA Contusion of left knee, initial encounter: Secondary | ICD-10-CM | POA: Insufficient documentation

## 2016-04-19 DIAGNOSIS — S80211A Abrasion, right knee, initial encounter: Secondary | ICD-10-CM | POA: Insufficient documentation

## 2016-04-19 DIAGNOSIS — F17210 Nicotine dependence, cigarettes, uncomplicated: Secondary | ICD-10-CM | POA: Insufficient documentation

## 2016-04-19 DIAGNOSIS — M25561 Pain in right knee: Secondary | ICD-10-CM

## 2016-04-19 DIAGNOSIS — Y9389 Activity, other specified: Secondary | ICD-10-CM | POA: Insufficient documentation

## 2016-04-19 DIAGNOSIS — Y92410 Unspecified street and highway as the place of occurrence of the external cause: Secondary | ICD-10-CM | POA: Insufficient documentation

## 2016-04-19 DIAGNOSIS — J45909 Unspecified asthma, uncomplicated: Secondary | ICD-10-CM | POA: Insufficient documentation

## 2016-04-19 DIAGNOSIS — Y999 Unspecified external cause status: Secondary | ICD-10-CM | POA: Insufficient documentation

## 2016-04-19 DIAGNOSIS — M25562 Pain in left knee: Secondary | ICD-10-CM

## 2016-04-19 DIAGNOSIS — Z79899 Other long term (current) drug therapy: Secondary | ICD-10-CM | POA: Insufficient documentation

## 2016-04-19 MED ORDER — KETOROLAC TROMETHAMINE 30 MG/ML IJ SOLN
30.0000 mg | Freq: Once | INTRAMUSCULAR | Status: AC
  Administered 2016-04-19: 30 mg via INTRAMUSCULAR
  Filled 2016-04-19: qty 1
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
## 2016-04-20 ENCOUNTER — Encounter: Payer: Self-pay | Admitting: Orthopaedic Surgery

## 2016-04-20 ENCOUNTER — Ambulatory Visit: Payer: Self-pay

## 2016-04-20 NOTE — Discharge Instructions (Signed)
X-rays of your knees were negative for any acute fractures or dislocations. Follow-up with Dr. Hilda LiasKeeling regarding your knee pain. Follow-up with your primary care provider within 2 days to have your knees reevaluated. Use ice on your knees as often as you can and be sure to keep a thin cloth between your skin and the ice. Keep the wound on your right knee clean and dry. Use antibiotic ointment on your wound and keep it covered with a clean bandage. Return to emergency department if you see signs of infection to include redness, swelling, foul drainage.  Return to emergency department if your knee pain worsens, you experience increased swelling, increased redness, increased warmth, fevers or chills.  Cryotherapy Cryotherapy means treatment with cold. Ice or gel packs can be used to reduce both pain and swelling. Ice is the most helpful within the first 24 to 48 hours after an injury or flare-up from overusing a muscle or joint. Sprains, strains, spasms, burning pain, shooting pain, and aches can all be eased with ice. Ice can also be used when recovering from surgery. Ice is effective, has very few side effects, and is safe for most people to use. PRECAUTIONS  Ice is not a safe treatment option for people with:  Raynaud phenomenon. This is a condition affecting small blood vessels in the extremities. Exposure to cold may cause your problems to return.  Cold hypersensitivity. There are many forms of cold hypersensitivity, including:  Cold urticaria. Red, itchy hives appear on the skin when the tissues begin to warm after being iced.  Cold erythema. This is a red, itchy rash caused by exposure to cold.  Cold hemoglobinuria. Red blood cells break down when the tissues begin to warm after being iced. The hemoglobin that carry oxygen are passed into the urine because they cannot combine with blood proteins fast enough.  Numbness or altered sensitivity in the area being iced. If you have any of the  following conditions, do not use ice until you have discussed cryotherapy with your caregiver:  Heart conditions, such as arrhythmia, angina, or chronic heart disease.  High blood pressure.  Healing wounds or open skin in the area being iced.  Current infections.  Rheumatoid arthritis.  Poor circulation.  Diabetes. Ice slows the blood flow in the region it is applied. This is beneficial when trying to stop inflamed tissues from spreading irritating chemicals to surrounding tissues. However, if you expose your skin to cold temperatures for too long or without the proper protection, you can damage your skin or nerves. Watch for signs of skin damage due to cold. HOME CARE INSTRUCTIONS Follow these tips to use ice and cold packs safely.  Place a dry or damp towel between the ice and skin. A damp towel will cool the skin more quickly, so you may need to shorten the time that the ice is used.  For a more rapid response, add gentle compression to the ice.  Ice for no more than 10 to 20 minutes at a time. The bonier the area you are icing, the less time it will take to get the benefits of ice.  Check your skin after 5 minutes to make sure there are no signs of a poor response to cold or skin damage.  Rest 20 minutes or more between uses.  Once your skin is numb, you can end your treatment. You can test numbness by very lightly touching your skin. The touch should be so light that you do not see the  skin dimple from the pressure of your fingertip. When using ice, most people will feel these normal sensations in this order: cold, burning, aching, and numbness.  Do not use ice on someone who cannot communicate their responses to pain, such as small children or people with dementia. HOW TO MAKE AN ICE PACK Ice packs are the most common way to use ice therapy. Other methods include ice massage, ice baths, and cryosprays. Muscle creams that cause a cold, tingly feeling do not offer the same  benefits that ice offers and should not be used as a substitute unless recommended by your caregiver. To make an ice pack, do one of the following:  Place crushed ice or a bag of frozen vegetables in a sealable plastic bag. Squeeze out the excess air. Place this bag inside another plastic bag. Slide the bag into a pillowcase or place a damp towel between your skin and the bag.  Mix 3 parts water with 1 part rubbing alcohol. Freeze the mixture in a sealable plastic bag. When you remove the mixture from the freezer, it will be slushy. Squeeze out the excess air. Place this bag inside another plastic bag. Slide the bag into a pillowcase or place a damp towel between your skin and the bag. SEEK MEDICAL CARE IF:  You develop white spots on your skin. This may give the skin a blotchy (mottled) appearance.  Your skin turns blue or pale.  Your skin becomes waxy or hard.  Your swelling gets worse. MAKE SURE YOU:   Understand these instructions.  Will watch your condition.  Will get help right away if you are not doing well or get worse.   This information is not intended to replace advice given to you by your health care provider. Make sure you discuss any questions you have with your health care provider.   Document Released: 05/30/2011 Document Revised: 10/24/2014 Document Reviewed: 05/30/2011 Elsevier Interactive Patient Education 2016 [employer]  Joint Pain Joint pain, which is also called arthralgia, can be caused by many things. Joint pain often goes away when you follow your health care provider's instructions for relieving pain at home. However, joint pain can also be caused by conditions that require further treatment. Common causes of joint pain include:  Bruising in the area of the joint.  Overuse of the joint.  Wear and tear on the joints that occur with aging (osteoarthritis).  Various other forms of arthritis.  A buildup of a crystal form of uric acid in the joint  (gout).  Infections of the joint (septic arthritis) or of the bone (osteomyelitis). Your health care provider may recommend medicine to help with the pain. If your joint pain continues, additional tests may be needed to diagnose your condition. HOME CARE INSTRUCTIONS Watch your condition for any changes. Follow these instructions as directed to lessen the pain that you are feeling.  Take medicines only as directed by your health care provider.  Rest the affected area for as long as your health care provider says that you should. If directed to do so, raise the painful joint above the level of your heart while you are sitting or lying down.  Do not do things that cause or worsen pain.  If directed, apply ice to the painful area:  Put ice in a plastic bag.  Place a towel between your skin and the bag.  Leave the ice on for 20 minutes, 2-3 times per day.  Wear an elastic bandage, splint, or sling  as directed by your health care provider. Loosen the elastic bandage or splint if your fingers or toes become numb and tingle, or if they turn cold and blue.  Begin exercising or stretching the affected area as directed by your health care provider. Ask your health care provider what types of exercise are safe for you.  Keep all follow-up visits as directed by your health care provider. This is important. SEEK MEDICAL CARE IF:  Your pain increases, and medicine does not help.  Your joint pain does not improve within 3 days.  You have increased bruising or swelling.  You have a fever.  You lose 10 lb (4.5 kg) or more without trying. SEEK IMMEDIATE MEDICAL CARE IF:  You are not able to move the joint.  Your fingers or toes become numb or they turn cold and blue.   This information is not intended to replace advice given to you by your health care provider. Make sure you discuss any questions you have with your health care provider.   Document Released: 10/03/2005 Document Revised:  10/24/2014 Document Reviewed: 07/15/2014 Elsevier Interactive Patient Education [employer]

## 2016-04-20 NOTE — ED Notes (Signed)
Ace wrap applied to left knee

## 2016-04-26 ENCOUNTER — Other Ambulatory Visit: Payer: Self-pay | Admitting: Specialist

## 2016-04-26 ENCOUNTER — Encounter: Payer: Self-pay | Admitting: Orthopaedic Surgery

## 2016-04-26 DIAGNOSIS — Z8781 Personal history of (healed) traumatic fracture: Secondary | ICD-10-CM

## 2016-04-26 DIAGNOSIS — R29890 Loss of height: Secondary | ICD-10-CM

## 2016-04-28 ENCOUNTER — Encounter: Payer: Self-pay | Admitting: Orthopaedic Surgery

## 2016-04-28 ENCOUNTER — Ambulatory Visit (INDEPENDENT_AMBULATORY_CARE_PROVIDER_SITE_OTHER): Payer: Self-pay | Admitting: Orthopaedic Surgery

## 2016-04-28 ENCOUNTER — Ambulatory Visit (INDEPENDENT_AMBULATORY_CARE_PROVIDER_SITE_OTHER): Payer: Self-pay

## 2016-04-28 VITALS — BP 122/82 | HR 70 | Temp 97.7°F | Ht 66.0 in | Wt 183.0 lb

## 2016-04-28 DIAGNOSIS — M25512 Pain in left shoulder: Secondary | ICD-10-CM

## 2016-04-28 DIAGNOSIS — S43015D Anterior dislocation of left humerus, subsequent encounter: Secondary | ICD-10-CM

## 2016-04-28 MED ORDER — HYDROCODONE-ACETAMINOPHEN 7.5-325 MG PO TABS: ORAL_TABLET | ORAL | Status: DC

## 2016-04-28 NOTE — Progress Notes (Signed)
Patient ID:Jacob Reilly, male DOB:06/28/1966, 50 y.o. JXB:[id number]  Chief Complaint  Patient presents with  . Follow-up    leftEA:VWUJW shoulder dislocation    HPI  Jacob Reilly is a 50 y.o. male who had dislocation of the left shoulder two weeks ago.  He has continued pain of the shoulder.  He has no other subluxation or dislocation.  He complains of some pain of the little finger.  HPI  Body mass index is 29.55 kg/(m^2).  ROS  Review of Systems  HENT: Negative for congestion.   Respiratory: Positive for shortness of breath. Negative for cough.   Cardiovascular: Negative for chest pain and leg swelling.  Endocrine: Negative for cold intolerance.  Musculoskeletal: Positive for myalgias, back pain and arthralgias.  Allergic/Immunologic: Negative for environmental allergies.  Psychiatric/Behavioral: The patient is nervous/anxious.     Past Medical History  Diagnosis Date  . Asthma     as child-now with allergy season only.  . Kidney calculi     Past Surgical History  Procedure Laterality Date  . Back surgery    . I&d extremity  10/24/2012    Procedure: IRRIGATION AND DEBRIDEMENT EXTREMITY;  Surgeon: Tami RibasKevin R Kuzma, MD;  [facility];  Service: Orthopedics;  Laterality: Right;  . Open reduction internal fixation (orif) hand Right thumb  . Hydrocele excision Left 08/01/2013    Procedure: HYDROCELECTOMY ADULT;  Surgeon: Ky BarbanMohammad I Javaid, MD;  Location: [facility] ORS;  Service: Urology;  Laterality: Left;  . Thumb fusion Right   . Open reduction internal fixation (orif) distal radial fracture Right 05/16/2015    Procedure: OPEN REDUCTION INTERNAL FIXATION (ORIF) RIGHT DISTAL RADIUS FRACTURE;  Surgeon: Dairl PonderMatthew Weingold, MD;  Location: [facility] OR;  Service: Orthopedics;  Laterality: Right;  . Open reduction internal fixation (orif) metacarpal Left 05/16/2015    Procedure: OPEN REDUCTION INTERNAL FIXATION OF MIDDLE PHALANGEAL FRACTURE, EXTENSOR TENDON REPAIR WITH ULNAR DIGITAL NERVE REPAIR  MICROSCOPICALLY;  Surgeon: Dairl PonderMatthew Weingold, MD;  Location: [facility] OR;  Service: Orthopedics;  Laterality: Left;    Family History  Problem Relation Age of Onset  . Diabetes Mother   . Hypertension Mother   . Obesity Mother   . Heart disease Mother   . Kidney disease Mother   . COPD Mother   . Cancer Father     Social History Social History  Substance Use Topics  . Smoking status: Current Every Day Smoker -- 0.25 packs/day for 44 years    Types: Cigarettes  . Smokeless tobacco: Former NeurosurgeonUser    Types: Chew  . Alcohol Use: No     Comment: hx heavy etoh august 2016    Allergies  Allergen Reactions  . Penicillins Other (See Comments)    Childhood Allergy.  Has patient had a PCN reaction causing immediate rash, facial/tongue/throat swelling, SOB or lightheadedness with hypotension: unknown Has patient had a PCN reaction causing severe rash involving mucus membranes or skin necrosis: unknown Has patient had a PCN reaction that required hospitalization unknown Has patient had a PCN reaction occurring within the last 10 years: unknown If all of the above answers are "NO", then may proceed with Cephalosporin use.     Current Outpatient Prescriptions  Medication Sig Dispense Refill  . albuterol (PROVENTIL HFA;VENTOLIN HFA) 108 (90 BASE) MCG/ACT inhaler Inhale 2 puffs into the lungs every 6 (six) hours as needed for wheezing or shortness of breath.    Jacob Reilly. atorvastatin (LIPITOR) 20 MG tablet Take 1 tablet (20 mg total) by mouth daily. 90 tablet 1  .  BusPIRone HCl (BUSPAR PO) Take 1 tablet by mouth 2 (two) times daily.    . DULoxetine (CYMBALTA) 60 MG capsule Take 60 mg by mouth daily.    . Multiple Vitamin (MULTIVITAMIN WITH MINERALS) TABS tablet Take 1 tablet by mouth daily.    . traZODone (DESYREL) 100 MG tablet Take 100 mg by mouth at bedtime.    Jacob Reilly HYDROcodone-acetaminophen (NORCO) 7.5-325 MG tablet One every four hours for pain as needed.  Do not drive car or operate machinery while  taking this medicine.  Must last 15 days. 60 tablet 0   No current facility-administered medications for this visit.     Physical Exam  Blood pressure 122/82, pulse 70, temperature 97.7 F (36.5 C), height  (1.676 m), weight 183 lb (83.008 kg).  Constitutional: overall normal hygiene, normal nutrition, well developed, normal grooming, normal body habitus. Assistive device:sling  Musculoskeletal: gait and station Limp none, muscle tone and strength are normal, no tremors or atrophy is present.  .  Neurological: coordination overall normal.  Deep tendon reflex/nerve stretch intact.  Sensation normal.  Cranial nerves II-XII intact.   Skin:   normal overall no scars, lesions, ulcers or rashes. No psoriasis.  Psychiatric: Alert and oriented x 3.  Recent memory intact, remote memory unclear.  Normal mood and affect. Well groomed.  Good eye contact.  Cardiovascular: overall no swelling, no varicosities, no edema bilaterally, normal temperatures of the legs and arms, no clubbing, cyanosis and good capillary refill.  Lymphatic: palpation is normal.  Left shoulder motion is limited.  I went over instructions for exercises of the left shoulder and positions to avoid.  He appears to understand.  He has slight decreased sensation of the left little finger but has full motion and no swelling.  The patient has been educated about the nature of the problem(s) and counseled on treatment options.  The patient appeared to understand what I have discussed and is in agreement with it.  Encounter Diagnoses  Name Primary?  Jacob Reilly Anterior dislocation of left shoulder, subsequent encounter Yes  . Left shoulder pain     PLAN Call if any problems.  Precautions discussed.  Continue current medications.   Return to clinic Get MRI of the left shoulder.   Electronically Signed [name], MD 7/13/201711:09 AM

## 2016-04-29 ENCOUNTER — Ambulatory Visit
Admission: RE | Admit: 2016-04-29 | Discharge: 2016-04-29 | Disposition: A | Discharge status: Home / Self Care | Payer: Self-pay | Source: Ambulatory Visit | Attending: Specialist | Admitting: Specialist

## 2016-04-29 DIAGNOSIS — M546 Pain in thoracic spine: Secondary | ICD-10-CM

## 2016-04-29 DIAGNOSIS — M545 Low back pain, unspecified: Secondary | ICD-10-CM

## 2016-04-29 DIAGNOSIS — G8929 Other chronic pain: Secondary | ICD-10-CM

## 2016-04-29 MED ORDER — MEPERIDINE HCL 100 MG/ML IJ SOLN
100.0000 mg | Freq: Once | INTRAMUSCULAR | Status: AC
  Administered 2016-04-29: 100 mg via INTRAMUSCULAR

## 2016-04-29 MED ORDER — DIAZEPAM 5 MG PO TABS
10.0000 mg | ORAL_TABLET | Freq: Once | ORAL | Status: AC
  Administered 2016-04-29: 10 mg via ORAL

## 2016-04-29 MED ORDER — IOPAMIDOL (ISOVUE-M 300) INJECTION 61%
10.0000 mL | Freq: Once | INTRAMUSCULAR | Status: AC | PRN
  Administered 2016-04-29: 10 mL via INTRATHECAL

## 2016-04-29 MED ORDER — ONDANSETRON HCL 4 MG/2ML IJ SOLN
4.0000 mg | Freq: Once | INTRAMUSCULAR | Status: AC
  Administered 2016-04-29: 4 mg via INTRAMUSCULAR

## 2016-04-29 NOTE — Progress Notes (Signed)
Patient states he has been off Cymbalta, Trazodone and Buspar for at least the past two days.  jkl

## 2016-04-29 NOTE — Discharge Instructions (Signed)
Myelogram Discharge Instructions  1. Go home and rest quietly for the next 24 hours.  It is important to lie flat for the next 24 hours.  Get up only to go to the restroom.  You may lie in the bed or on a couch on your back, your stomach, your left side or your right side.  You may have one pillow under your head.  You may have pillows between your knees while you are on your side or under your knees while you are on your back.  2. DO NOT drive today.  Recline the seat as far back as it will go, while still wearing your seat belt, on the way home.  3. You may get up to go to the bathroom as needed.  You may sit up for 10 minutes to eat.  You may resume your normal diet and medications unless otherwise indicated.  Drink lots of extra fluids today and tomorrow.  4. The incidence of headache, nausea, or vomiting is about 5% (one in 20 patients).  If you develop a headache, lie flat and drink plenty of fluids until the headache goes away.  Caffeinated beverages may be helpful.  If you develop severe nausea and vomiting or a headache that does not go away with flat bed rest, call [phone].  5. You may resume normal activities after your 24 hours of bed rest is over; however, do not exert yourself strongly or do any heavy lifting tomorrow. If when you get up you have a headache when standing, go back to bed and force fluids for another 24 hours.  6. Call your physician for a follow-up appointment.  The results of your myelogram will be sent directly to your physician by the following day.  7. If you have any questions or if complications develop after you arrive home, please call [phone].  Discharge instructions have been explained to the patient.  The patient, or the person responsible for the patient, fully understands these instructions.        May resume Cymbalta, Trazodone and Buspar on April 30, 2016, after 10:30 am.

## 2016-05-03 ENCOUNTER — Other Ambulatory Visit: Payer: Self-pay

## 2016-05-03 DIAGNOSIS — E785 Hyperlipidemia, unspecified: Secondary | ICD-10-CM

## 2016-05-04 ENCOUNTER — Ambulatory Visit (INDEPENDENT_AMBULATORY_CARE_PROVIDER_SITE_OTHER): Payer: Self-pay | Admitting: Orthopaedic Surgery

## 2016-05-04 ENCOUNTER — Encounter: Payer: Self-pay | Admitting: Orthopaedic Surgery

## 2016-05-04 VITALS — BP 127/79 | HR 91 | Temp 97.7°F | Ht 66.0 in | Wt 185.0 lb

## 2016-05-04 DIAGNOSIS — M25562 Pain in left knee: Secondary | ICD-10-CM

## 2016-05-04 LAB — LIPID PANEL
Cholesterol: 137 mg/dL (ref 125–200)
HDL: 46 mg/dL (ref >=40)
LDL CALC: 65 mg/dL (ref <130)
TRIGLYCERIDES: 131 mg/dL (ref <150)
Total CHOL/HDL Ratio: 3 Ratio (ref <=5.0)
VLDL: 26 mg/dL (ref <30)

## 2016-05-04 LAB — COMPLETE METABOLIC PANEL WITH GFR
ALT: 19 U/L (ref 9–46)
AST: 17 U/L (ref 10–35)
Albumin: 3.8 g/dL (ref 3.6–5.1)
Alkaline Phosphatase: 91 U/L (ref 40–115)
BUN: 12 mg/dL (ref 7–25)
CHLORIDE: 99 mmol/L (ref 98–110)
CO2: 25 mmol/L (ref 20–31)
Calcium: 8.9 mg/dL (ref 8.6–10.3)
Creat: 0.89 mg/dL (ref 0.70–1.33)
Glucose, Bld: 89 mg/dL (ref 65–99)
POTASSIUM: 4.4 mmol/L (ref 3.5–5.3)
Sodium: 137 mmol/L (ref 135–146)
Total Bilirubin: 0.3 mg/dL (ref 0.2–1.2)
Total Protein: 5.9 g/dL — ABNORMAL LOW (ref 6.1–8.1)

## 2016-05-04 NOTE — Progress Notes (Signed)
Patient ZO:XWRUE Reilly, male DOB:05-Oct-1966, 49 y.o. AVW:[id number]  Chief Complaint  Patient presents with  . Knee Pain    left knee pain    HPI  Jacob Reilly is a 50 y.o. male who is here today for left knee pain.  He has long history of pain in the left knee.  He had a fracture of the left knee taken care of at [facility] in 2013.  No surgery was done on the knee. He has popping but no giving way. He has swelling but no new trauma.  He says the knee just does not feel right.  He had x-rays of the knee (both sides) earlier this month.  He has degenerative changes more on the medial side of the knee with no loose body or fracture.  He has pain medicine as I am also treating his left shoulder.  He is seeing Dr. Otelia Sergeant in [place] for back problems and recently had CT scan of his back.   HPI  Body mass index is 29.87 kg/(m^2).  ROS  Review of Systems  HENT: Negative for congestion.   Respiratory: Positive for shortness of breath. Negative for cough.   Cardiovascular: Negative for chest pain and leg swelling.  Endocrine: Negative for cold intolerance.  Musculoskeletal: Positive for myalgias, back pain and arthralgias.  Allergic/Immunologic: Negative for environmental allergies.  Psychiatric/Behavioral: The patient is nervous/anxious.     Past Medical History  Diagnosis Date  . Asthma     as child-now with allergy season only.  . Kidney calculi     Past Surgical History  Procedure Laterality Date  . Back surgery    . I&d extremity  10/24/2012    Procedure: IRRIGATION AND DEBRIDEMENT EXTREMITY;  Surgeon: Tami Ribas, MD;  Location: [facility];  Service: Orthopedics;  Laterality: Right;  . Open reduction internal fixation (orif) hand Right thumb  . Hydrocele excision Left 08/01/2013    Procedure: HYDROCELECTOMY ADULT;  Surgeon: Ky Barban, MD;  Location: [facility] ORS;  Service: Urology;  Laterality: Left;  . Thumb fusion Right   . Open reduction internal fixation (orif)  distal radial fracture Right 05/16/2015    Procedure: OPEN REDUCTION INTERNAL FIXATION (ORIF) RIGHT DISTAL RADIUS FRACTURE;  Surgeon: Dairl Ponder, MD;  Location: [facility] OR;  Service: Orthopedics;  Laterality: Right;  . Open reduction internal fixation (orif) metacarpal Left 05/16/2015    Procedure: OPEN REDUCTION INTERNAL FIXATION OF MIDDLE PHALANGEAL FRACTURE, EXTENSOR TENDON REPAIR WITH ULNAR DIGITAL NERVE REPAIR MICROSCOPICALLY;  Surgeon: Dairl Ponder, MD;  Location: [facility] OR;  Service: Orthopedics;  Laterality: Left;    Family History  Problem Relation Age of Onset  . Diabetes Mother   . Hypertension Mother   . Obesity Mother   . Heart disease Mother   . Kidney disease Mother   . COPD Mother   . Cancer Father     Social History Social History  Substance Use Topics  . Smoking status: Current Every Day Smoker -- 0.25 packs/day for 44 years    Types: Cigarettes  . Smokeless tobacco: Former Neurosurgeon    Types: Chew  . Alcohol Use: No     Comment: hx heavy etoh august 2016    Allergies  Allergen Reactions  . Penicillins Other (See Comments)    Childhood Allergy.  Has patient had a PCN reaction causing immediate rash, facial/tongue/throat swelling, SOB or lightheadedness with hypotension: unknown Has patient had a PCN reaction causing severe rash involving mucus membranes or skin necrosis: unknown Has  patient had a PCN reaction that required hospitalization unknown Has patient had a PCN reaction occurring within the last 10 years: unknown If all of the above answers are "NO", then may proceed with Cephalosporin use.     Current Outpatient Prescriptions  Medication Sig Dispense Refill  . albuterol (PROVENTIL HFA;VENTOLIN HFA) 108 (90 BASE) MCG/ACT inhaler Inhale 2 puffs into the lungs every 6 (six) hours as needed for wheezing or shortness of breath.    Marland Kitchen. atorvastatin (LIPITOR) 20 MG tablet Take 1 tablet (20 mg total) by mouth daily. 90 tablet 1  . Brexpiprazole (REXULTI PO) Take  by mouth.    . BusPIRone HCl (BUSPAR PO) Take 1 tablet by mouth 2 (two) times daily.    . DULoxetine (CYMBALTA) 60 MG capsule Take 60 mg by mouth daily.    Marland Kitchen. HYDROcodone-acetaminophen (NORCO) 7.5-325 MG tablet One every four hours for pain as needed.  Do not drive car or operate machinery while taking this medicine.  Must last 15 days. 60 tablet 0  . Multiple Vitamin (MULTIVITAMIN WITH MINERALS) TABS tablet Take 1 tablet by mouth daily.    . traZODone (DESYREL) 100 MG tablet Take 100 mg by mouth at bedtime.     No current facility-administered medications for this visit.     Physical Exam  Blood pressure 127/79, pulse 91, temperature 97.7 F (36.5 C), height 5\' 6"  (1.676 m), weight 185 lb (83.915 kg).  Constitutional: overall normal hygiene, normal nutrition, well developed, normal grooming, normal body habitus. Assistive device:none  Musculoskeletal: gait and station Limp left, muscle tone and strength are normal, no tremors or atrophy is present.  .  Neurological: coordination overall normal.  Deep tendon reflex/nerve stretch intact.  Sensation normal.  Cranial nerves II-XII intact.   Skin:   normal overall no scars, lesions, ulcers or rashes. No psoriasis.  Psychiatric: Alert and oriented x 3.  Recent memory intact, remote memory unclear.  Normal mood and affect. Well groomed.  Good eye contact.  Cardiovascular: overall no swelling, no varicosities, no edema bilaterally, normal temperatures of the legs and arms, no clubbing, cyanosis and good capillary refill.  Lymphatic: palpation is normal.  The left lower extremity is examined:  Inspection:  Thigh:  Non-tender and no defects  Knee has swelling 1+ effusion.                        Joint tenderness is present                        Patient is tender over the medial joint line  Lower Leg:  Has normal appearance and no tenderness or defects  Ankle:  Non-tender and no defects  Foot:  Non-tender and no defects Range of  Motion:  Knee:  Range of motion is: 0-105                        Crepitus is  present  Ankle:  Range of motion is normal. Strength and Tone:  The left lower extremity has normal strength and tone. Stability:  Knee:  The knee is stable.  Ankle:  The ankle is stable.    The patient has been educated about the nature of the problem(s) and counseled on treatment options.  The patient appeared to understand what I have discussed and is in agreement with it.  Encounter Diagnosis  Name Primary?  . Left knee pain Yes  PLAN Call if any problems.  Precautions discussed.  Continue current medications.   Return to clinic after his MRI on his shoulder and decision is made on the shoulder before considering a MRI of the left knee.  I feel he will also need MRI of the left knee.   I am concerned he is seeing me for his shoulder and his knee and Dr. Otelia Sergeant in [place] for his back.  However, we will continue this as it is how it has been arranged.  I have found that consolidating care is usually easier.  His MRI of the shoulder is for July 26th.  The patient was concerned I did not address his shoulder today but he was told this was a visit for the knee.  I really need to have the MRI done on the shoulder before any further decision can be made there.  He was told this.  Electronically Signed [name], MD 7/19/20171:18 PM

## 2016-05-11 ENCOUNTER — Ambulatory Visit (HOSPITAL_COMMUNITY)
Admission: RE | Admit: 2016-05-11 | Discharge: 2016-05-11 | Disposition: A | Discharge status: Home / Self Care | Payer: Self-pay | Source: Ambulatory Visit | Attending: Orthopaedic Surgery | Admitting: Orthopaedic Surgery

## 2016-05-11 ENCOUNTER — Ambulatory Visit (HOSPITAL_COMMUNITY): Admission: RE | Admit: 2016-05-11 | Payer: MEDICAID | Source: Ambulatory Visit

## 2016-05-11 ENCOUNTER — Ambulatory Visit: Payer: Self-pay | Admitting: Physician Assistant

## 2016-05-11 ENCOUNTER — Other Ambulatory Visit: Payer: Self-pay | Admitting: Orthopaedic Surgery

## 2016-05-11 DIAGNOSIS — S43015D Anterior dislocation of left humerus, subsequent encounter: Secondary | ICD-10-CM

## 2016-05-11 DIAGNOSIS — X58XXXD Exposure to other specified factors, subsequent encounter: Secondary | ICD-10-CM | POA: Insufficient documentation

## 2016-05-11 DIAGNOSIS — S42292A Other displaced fracture of upper end of left humerus, initial encounter for closed fracture: Secondary | ICD-10-CM | POA: Insufficient documentation

## 2016-05-11 DIAGNOSIS — M659 Synovitis and tenosynovitis, unspecified: Secondary | ICD-10-CM | POA: Insufficient documentation

## 2016-05-11 DIAGNOSIS — M75122 Complete rotator cuff tear or rupture of left shoulder, not specified as traumatic: Secondary | ICD-10-CM | POA: Insufficient documentation

## 2016-05-12 ENCOUNTER — Encounter (HOSPITAL_COMMUNITY): Payer: Self-pay

## 2016-05-12 ENCOUNTER — Other Ambulatory Visit: Payer: Self-pay

## 2016-05-12 ENCOUNTER — Emergency Department (HOSPITAL_COMMUNITY): Payer: Self-pay

## 2016-05-12 ENCOUNTER — Emergency Department (HOSPITAL_COMMUNITY)
Admission: EM | Admit: 2016-05-12 | Discharge: 2016-05-12 | Disposition: A | Discharge status: Home / Self Care | Payer: Self-pay | Attending: Emergency Medicine | Admitting: Emergency Medicine

## 2016-05-12 DIAGNOSIS — E785 Hyperlipidemia, unspecified: Secondary | ICD-10-CM | POA: Insufficient documentation

## 2016-05-12 DIAGNOSIS — F17210 Nicotine dependence, cigarettes, uncomplicated: Secondary | ICD-10-CM | POA: Insufficient documentation

## 2016-05-12 DIAGNOSIS — Z79899 Other long term (current) drug therapy: Secondary | ICD-10-CM | POA: Insufficient documentation

## 2016-05-12 DIAGNOSIS — Y929 Unspecified place or not applicable: Secondary | ICD-10-CM | POA: Insufficient documentation

## 2016-05-12 DIAGNOSIS — Y939 Activity, unspecified: Secondary | ICD-10-CM | POA: Insufficient documentation

## 2016-05-12 DIAGNOSIS — W208XXA Other cause of strike by thrown, projected or falling object, initial encounter: Secondary | ICD-10-CM | POA: Insufficient documentation

## 2016-05-12 DIAGNOSIS — S92422A Displaced fracture of distal phalanx of left great toe, initial encounter for closed fracture: Secondary | ICD-10-CM | POA: Insufficient documentation

## 2016-05-12 DIAGNOSIS — Y999 Unspecified external cause status: Secondary | ICD-10-CM | POA: Insufficient documentation

## 2016-05-12 DIAGNOSIS — J40 Bronchitis, not specified as acute or chronic: Secondary | ICD-10-CM | POA: Insufficient documentation

## 2016-05-12 DIAGNOSIS — S92492A Other fracture of left great toe, initial encounter for closed fracture: Secondary | ICD-10-CM

## 2016-05-12 MED ORDER — ALBUTEROL SULFATE HFA 108 (90 BASE) MCG/ACT IN AERS
2.0000 | INHALATION_SPRAY | Freq: Once | RESPIRATORY_TRACT | Status: AC
  Administered 2016-05-12: 2 via RESPIRATORY_TRACT
  Filled 2016-05-12: qty 6.7

## 2016-05-12 MED ORDER — HYDROCODONE-ACETAMINOPHEN 5-325 MG PO TABS
1.0000 | ORAL_TABLET | Freq: Once | ORAL | Status: AC
  Administered 2016-05-12: 1 via ORAL
  Filled 2016-05-12: qty 1

## 2016-05-12 MED ORDER — DEXAMETHASONE SODIUM PHOSPHATE 4 MG/ML IJ SOLN
8.0000 mg | Freq: Once | INTRAMUSCULAR | Status: AC
  Administered 2016-05-12: 8 mg via INTRAMUSCULAR
  Filled 2016-05-12: qty 2

## 2016-05-12 MED ORDER — IBUPROFEN 600 MG PO TABS: 600.0000 mg | ORAL_TABLET | Freq: Four times a day (QID) | ORAL | 0 refills | Status: DC | PRN

## 2016-05-12 MED ORDER — IBUPROFEN 800 MG PO TABS
800.0000 mg | ORAL_TABLET | Freq: Once | ORAL | Status: AC
  Administered 2016-05-12: 800 mg via ORAL
  Filled 2016-05-12: qty 1

## 2016-05-12 MED ORDER — HYDROCODONE-ACETAMINOPHEN 5-325 MG PO TABS: 1.0000 | ORAL_TABLET | ORAL | 0 refills | Status: DC | PRN

## 2016-05-12 NOTE — ED Provider Notes (Signed)
[facility] Provider Note   CSN: [id number] Arrival date & time: 05/12/16  4098  First Provider Contact:  First MD Initiated Contact with Patient 05/12/16 2108        History   Chief Complaint Chief Complaint  Patient presents with  . Foot Injury    HPI Jacob Reilly is a 50 y.o. male.  Patient is a 50 year old male who presents to the emergency department with a complaint of pain and injury to the left foot and in particular the great toe.  The patient states that the heavy door fell on his foot and injured his toe earlier today. He states he has pain when he tries to walk on it. Even at rest he says it feels as though his heart is beating inside the toe. The patient has had multiple injuries of his lower extremities, but no recent injury to the toe prior to this injury. No other problem reported at this time.  The patient complains of cough and wheezing. He states he has tried using an inhaler, he is unsure of the name of the inhaler, but states this is helping very little. He has not had fever or chills, there's been no hemoptysis reported.      Past Medical History:  Diagnosis Date  . Asthma    as child-now with allergy season only.  . Kidney calculi     Patient Active Problem List   Diagnosis Date Noted  . Hyperlipidemia 02/10/2016  . Low back pain 02/10/2016  . Cigarette nicotine dependence with nicotine-induced disorder 02/10/2016  . Chronic pain 01/15/2016  . Anxiety disorder 01/15/2016  . Polysubstance abuse 04/06/2015  . Substance induced mood disorder (HCC) 04/06/2015  . Homicidal ideation   . MRSA infection 12/24/2012  . Smoker 12/24/2012  . Habitual alcohol use 12/24/2012  . Finger osteomyelitis, right (HCC) 11/26/2012    Past Surgical History:  Procedure Laterality Date  . BACK SURGERY    . HYDROCELE EXCISION Left 08/01/2013   Procedure: HYDROCELECTOMY ADULT;  Surgeon: Ky Barban, MD;  Location: [facility];  Service: Urology;   Laterality: Left;  . I&D EXTREMITY  10/24/2012   Procedure: IRRIGATION AND DEBRIDEMENT EXTREMITY;  Surgeon: Tami Ribas, MD;  Location: [facility];  Service: Orthopedics;  Laterality: Right;  . OPEN REDUCTION INTERNAL FIXATION (ORIF) DISTAL RADIAL FRACTURE Right 05/16/2015   Procedure: OPEN REDUCTION INTERNAL FIXATION (ORIF) RIGHT DISTAL RADIUS FRACTURE;  Surgeon: Dairl Ponder, MD;  Location: [facility];  Service: Orthopedics;  Laterality: Right;  . OPEN REDUCTION INTERNAL FIXATION (ORIF) HAND Right thumb  . OPEN REDUCTION INTERNAL FIXATION (ORIF) METACARPAL Left 05/16/2015   Procedure: OPEN REDUCTION INTERNAL FIXATION OF MIDDLE PHALANGEAL FRACTURE, EXTENSOR TENDON REPAIR WITH ULNAR DIGITAL NERVE REPAIR MICROSCOPICALLY;  Surgeon: Dairl Ponder, MD;  Location: [facility];  Service: Orthopedics;  Laterality: Left;  . THUMB FUSION Right        Home Medications    Prior to Admission medications   Medication Sig Start Date End Date Taking? Authorizing Provider  HYDROcodone-acetaminophen (NORCO) 7.5-325 MG tablet One every four hours for pain as needed.  Do not drive car or operate machinery while taking this medicine.  Must last 15 days. 04/28/16  Yes [name], MD  albuterol (PROVENTIL HFA;VENTOLIN HFA) 108 (90 BASE) MCG/ACT inhaler Inhale 2 puffs into the lungs every 6 (six) hours as needed for wheezing or shortness of breath.    Historical Provider, MD  atorvastatin (LIPITOR) 20 MG tablet Take 1 tablet (20 mg total) by  mouth daily. 02/10/16   [name], PA-C  Brexpiprazole (REXULTI PO) Take by mouth.    Historical Provider, MD  BusPIRone HCl (BUSPAR PO) Take 1 tablet by mouth 2 (two) times daily.    Historical Provider, MD  DULoxetine (CYMBALTA) 60 MG capsule Take 60 mg by mouth daily.    Historical Provider, MD  Multiple Vitamin (MULTIVITAMIN WITH MINERALS) TABS tablet Take 1 tablet by mouth daily.    Historical Provider, MD  traZODone (DESYREL) 100 MG tablet Take 100 mg by mouth at bedtime.     Historical Provider, MD    Family History Family History  Problem Relation Age of Onset  . Diabetes Mother   . Hypertension Mother   . Obesity Mother   . Heart disease Mother   . Kidney disease Mother   . COPD Mother   . Cancer Father     Social History Social History  Substance Use Topics  . Smoking status: Current Every Day Smoker    Packs/day: 0.25    Years: 44.00    Types: Cigarettes  . Smokeless tobacco: Former Neurosurgeon    Types: Chew  . Alcohol use No     Comment: hx heavy etoh august 2016     Allergies   Penicillins   Review of Systems Review of Systems  Constitutional: Negative for chills and fever.  Respiratory: Positive for cough and wheezing.   Musculoskeletal: Positive for arthralgias.  All other systems reviewed and are negative.    Physical Exam Updated Vital Signs BP 139/88 (BP Location: Right Arm)   Pulse 84   Temp 98.3 F (36.8 C) (Oral)   Resp 16   Ht 5\' 7"  (1.702 m)   Wt 79.4 kg   SpO2 99%   BMI 27.41 kg/m   Physical Exam  Constitutional: He is oriented to person, place, and time. He appears well-developed and well-nourished.  Non-toxic appearance.  HENT:  Head: Normocephalic.  Right Ear: Tympanic membrane and external ear normal.  Left Ear: Tympanic membrane and external ear normal.  Eyes: EOM and lids are normal. Pupils are equal, round, and reactive to light.  Neck: Normal range of motion. Neck supple. Carotid bruit is not present.  Cardiovascular: Normal rate, regular rhythm, normal heart sounds, intact distal pulses and normal pulses.   Pulmonary/Chest: No respiratory distress. He has wheezes.  Rhonchi present with few scattered wheezes. Symmetrical rise and fall of the chest. The patient speaks in complete sentences.  Abdominal: Soft. Bowel sounds are normal. There is no tenderness. There is no guarding.  Musculoskeletal: Normal range of motion.  The left first toe has bruising at the tip of the toe, the dorsum of the toe, and  the plantar surface. There is swelling noted. The dorsalis pedis pulse on the left is 2+. The posterior tibial pulse on the left is also 2+. There are multiple scratches and abrasions of the lower leg on, no evidence of injury to the tibial area or lower leg. There is full range of motion of the left knee and hip.  Lymphadenopathy:       Head (right side): No submandibular adenopathy present.       Head (left side): No submandibular adenopathy present.    He has no cervical adenopathy.  Neurological: He is alert and oriented to person, place, and time. He has normal strength. No cranial nerve deficit or sensory deficit.  Skin: Skin is warm and dry.  Psychiatric: He has a normal mood and affect. His speech is  normal.  Nursing note and vitals reviewed.    ED Treatments / Results  Labs (all labs ordered are listed, but only abnormal results are displayed) Labs Reviewed - No data to display  EKG  EKG Interpretation None       Radiology Dg Orbits  Result Date: 05/11/2016 CLINICAL DATA:  MRI screening examination. EXAM: ORBITS - COMPLETE 4+ VIEW COMPARISON:  None. FINDINGS: No radiopaque foreign body is identified. No bony abnormality is seen. IMPRESSION: The patient may proceed to MRI. Electronically Signed   By: Drusilla Kanner M.D.   On: 05/11/2016 19:00  Mr Shoulder Left Wo Contrast  Result Date: 05/12/2016 CLINICAL DATA:  Patient has history of dislocated shoulder with pain to entire left shoulder. EXAM: MRI OF THE LEFT SHOULDER WITHOUT CONTRAST TECHNIQUE: Multiplanar, multisequence MR imaging of the shoulder was performed. No intravenous contrast was administered. COMPARISON:  None. FINDINGS: Rotator cuff: Severe tendinosis of the supraspinatus and infraspinatus tendon with a large full-thickness tear of the posterior supraspinatus tendon and anterior infraspinatus tendon measuring 2 cm in anterior - posterior dimension with 3 cm of retraction. Teres minor tendon is intact. Moderate  tendinosis of the subscapularis tendon. Muscles: No atrophy or fatty replacement of nor abnormal signal within, the muscles of the rotator cuff. Biceps long head: Mild tendinosis of the intraarticular portion of the long head of the biceps tendon. Acromioclavicular Joint: Mild degenerative changes of the acromioclavicular joint. Large amount of subacromial/subdeltoid bursal fluid. Glenohumeral Joint: Large joint effusion with severe synovitis. Humeral avulsion of the inferior glenohumeral ligament. Partial-thickness cartilage loss of the glenohumeral joint. Labrum: Mild diffuse labral degeneration without a discrete labral tear. Bones: Mild marrow edema in the anterior inferior glenoid without a fracture. Severe marrow edema in the superior posterolateral humeral head with abnormal concavity consistent with a Hill-Sachs lesion. Other: None IMPRESSION: 1. Severe Hill-Sachs lesion involving the superior posterolateral humeral head with severe surrounding marrow edema. 2. Mild marrow edema in the anterior inferior glenoid without a fracture or definite labral tear. 3. Severe tendinosis of the supraspinatus and infraspinatus tendon with a large full-thickness tear of the posterior supraspinatus tendon and anterior infraspinatus tendon measuring 2 cm in anterior - posterior dimension with 3 cm of retraction. 4. Moderate tendinosis of the subscapularis tendon. 5. Large joint effusion with severe synovitis. 6. Humeral avulsion of the inferior glenohumeral ligament. 7. Partial-thickness cartilage loss of the glenohumeral joint. Electronically Signed   By: Elige Ko   On: 05/12/2016 08:34  Dg Toe Great Left  Result Date: 05/12/2016 CLINICAL DATA:  Dropped door on foot, now with pain and swelling. EXAM: LEFT GREAT TOE COMPARISON:  None. FINDINGS: There is a slightly comminuted and minimally displaced fracture involving the distal phalanx of the great toe with extension to involve the IP joint. No dislocation. Expected  adjacent soft tissue swelling. No radiopaque foreign body. IMPRESSION: Slightly comminuted and minimally displaced fracture of the distal phalanx of the great toe with extension to the IP joint. Expected adjacent soft tissue swelling. No radiopaque foreign body. Electronically Signed   By: Simonne Come M.D.   On: 05/12/2016 20:11  Dg Toe Great Right  Result Date: 05/12/2016 CLINICAL DATA:  Injury of the great toe. EXAM: RIGHT GREAT TOE COMPARISON:  None. FINDINGS: No fracture or dislocation. Joint spaces are preserved. No definite erosions. Regional soft tissues appear normal. Radiopaque foreign body. IMPRESSION: No acute findings. Electronically Signed   By: Simonne Come M.D.   On: 05/12/2016 20:10   Procedures Procedures (  including critical care time)  Medications Ordered in ED Medications  HYDROcodone-acetaminophen (NORCO/VICODIN) 5-325 MG per tablet 1 tablet (not administered)  ibuprofen (ADVIL,MOTRIN) tablet 800 mg (not administered)  albuterol (PROVENTIL HFA;VENTOLIN HFA) 108 (90 Base) MCG/ACT inhaler 2 puff (not administered)  dexamethasone (DECADRON) injection 8 mg (not administered)     Initial Impression / Assessment and Plan / ED Course  I have reviewed the triage vital signs and the nursing notes.  Pertinent labs & imaging results that were available during my care of the patient were reviewed by me and considered in my medical decision making (see chart for details).  Clinical Course  FRACTURE CARE RIGHT GREAT TOE. Patient reports a very heavy door fell on his toe and foot today. X-ray reveals a fracture of the distal phalanx of the right foot. I discussed the fracture with the patient in terms which he understands. I discussed the procedure with the patient in terms which he understands. The patient gives permission for the procedure.  Patient identified by arm band. The great toe and second toe were buddy taped. The patient was fitted with a postoperative shoe. Following the  buddy taped, the patient has good capillary refill. The dorsalis pedis pulses 2+.  The patient is treated with the patient's treated with ibuprofen and Norco. Patient tolerated the procedure without problem.  **I have reviewed nursing notes, vital signs, and all appropriate lab and imaging results for this patient.*  Final Clinical Impressions(s) / ED Diagnoses  X-ray reveals a fracture of the distal right first toe. Fracture care was carried out. Patient is treated with anti-inflammatory pain medication and Norco. The patient is to follow with his primary physician for additional pain management. The patient is referred to orthopedics for evaluation of his fracture. Patient knowledge is understanding of the discharge instructions.  The examination also suggest bronchitis. The patient was treated with albuterol inhaler and muscular Decadron. The patient states that he feels some better after the treatment.    Final diagnoses:  None    New Prescriptions New Prescriptions   No medications on file     [name], PA-C 05/12/16 2200    Mancel Bale, MD 05/17/16 [phone]

## 2016-05-12 NOTE — ED Triage Notes (Signed)
Reports of 200 lb door falling onto left foot/right great toe PTA.

## 2016-05-16 ENCOUNTER — Encounter: Payer: Self-pay | Admitting: Physician Assistant

## 2016-05-17 ENCOUNTER — Encounter: Payer: Self-pay | Admitting: Orthopaedic Surgery

## 2016-05-17 ENCOUNTER — Ambulatory Visit: Payer: Self-pay | Admitting: Physician Assistant

## 2016-05-17 ENCOUNTER — Encounter: Payer: Self-pay | Admitting: Physician Assistant

## 2016-05-17 ENCOUNTER — Ambulatory Visit (INDEPENDENT_AMBULATORY_CARE_PROVIDER_SITE_OTHER): Payer: Self-pay | Admitting: Orthopaedic Surgery

## 2016-05-17 VITALS — BP 130/85 | HR 76 | Temp 99.1°F | Ht 66.0 in | Wt 177.0 lb

## 2016-05-17 VITALS — BP 110/82 | HR 81 | Temp 98.2°F | Ht 67.0 in | Wt 177.0 lb

## 2016-05-17 DIAGNOSIS — E785 Hyperlipidemia, unspecified: Secondary | ICD-10-CM

## 2016-05-17 DIAGNOSIS — F17219 Nicotine dependence, cigarettes, with unspecified nicotine-induced disorders: Secondary | ICD-10-CM

## 2016-05-17 DIAGNOSIS — M75102 Unspecified rotator cuff tear or rupture of left shoulder, not specified as traumatic: Secondary | ICD-10-CM

## 2016-05-17 DIAGNOSIS — J209 Acute bronchitis, unspecified: Secondary | ICD-10-CM

## 2016-05-17 DIAGNOSIS — S92912A Unspecified fracture of left toe(s), initial encounter for closed fracture: Secondary | ICD-10-CM

## 2016-05-17 DIAGNOSIS — M25512 Pain in left shoulder: Secondary | ICD-10-CM

## 2016-05-17 MED ORDER — HYDROCODONE-ACETAMINOPHEN 5-325 MG PO TABS: 1.0000 | ORAL_TABLET | ORAL | 0 refills | Status: DC | PRN

## 2016-05-17 MED ORDER — AZITHROMYCIN 250 MG PO TABS: ORAL_TABLET | ORAL | 0 refills | Status: AC

## 2016-05-17 NOTE — Progress Notes (Signed)
BP 110/82 (BP Location: Left Arm, Patient Position: Sitting, Cuff Size: Normal)   Pulse 81   Temp 98.2 F (36.8 C) (Other (Comment))   Ht  (1.702 m)   Wt 177 lb (80.3 kg)   SpO2 97%   BMI 27.72 kg/m    Subjective:    Patient ID: Jacob Reilly, male    DOB: Jun 02, 1966, 50 y.o.   MRN: [id number]  HPI: Jacob Reilly is a 50 y.o. male presenting on 05/17/2016 for Follow-up   HPI   Pt is still going to [facility]   Pt is seeing orthopedics for shoulder and toe  Pt c/o being Sick with cold x 5 wk. Pt still smoking- about 1 ppd lately.  Pt says he got steroids from the ER but he still has wheezing and cough and feels bad  Relevant past medical, surgical, family and social history reviewed and updated as indicated. Interim medical history since our last visit reviewed. Allergies and medications reviewed and updated.  Current Outpatient Prescriptions:  .  albuterol (PROVENTIL HFA;VENTOLIN HFA) 108 (90 BASE) MCG/ACT inhaler, Inhale 2 puffs into the lungs every 6 (six) hours as needed for wheezing or shortness of breath., Disp: , Rfl:  .  atorvastatin (LIPITOR) 20 MG tablet, Take 1 tablet (20 mg total) by mouth daily., Disp: 90 tablet, Rfl: 1 .  Brexpiprazole (REXULTI) 1 MG TABS, Take 1 mg by mouth daily., Disp: , Rfl:  .  busPIRone (BUSPAR) 5 MG tablet, Take 5 mg by mouth 3 (three) times daily., Disp: , Rfl:  .  DULoxetine (CYMBALTA) 60 MG capsule, Take 60 mg by mouth daily., Disp: , Rfl:  .  HYDROcodone-acetaminophen (NORCO/VICODIN) 5-325 MG tablet, Take 1 tablet by mouth every 4 (four) hours as needed., Disp: 10 tablet, Rfl: 0 .  HYDROcodone-acetaminophen (NORCO/VICODIN) 5-325 MG tablet, Take 1 tablet by mouth every 4 (four) hours as needed for moderate pain (Must last 15 days.Do not take and drive a car or use machinery.)., Disp: 60 tablet, Rfl: 0 .  Multiple Vitamin (MULTIVITAMIN WITH MINERALS) TABS tablet, Take 1 tablet by mouth daily., Disp: , Rfl:  .  traZODone (DESYREL) 100 MG  tablet, Take 100 mg by mouth at bedtime., Disp: , Rfl:  .  ibuprofen (ADVIL,MOTRIN) 600 MG tablet, Take 1 tablet (600 mg total) by mouth every 6 (six) hours as needed. (Patient not taking: Reported on 05/17/2016), Disp: 30 tablet, Rfl: 0  Review of Systems  Constitutional: Negative for appetite change, chills, diaphoresis, fatigue, fever and unexpected weight change.  HENT: Positive for congestion, dental problem, sneezing and sore throat. Negative for drooling, ear pain, facial swelling, hearing loss, mouth sores, trouble swallowing and voice change.   Eyes: Negative for pain, discharge, redness, itching and visual disturbance.  Respiratory: Positive for cough, shortness of breath and wheezing. Negative for choking.   Cardiovascular: Negative for chest pain, palpitations and leg swelling.  Gastrointestinal: Positive for constipation. Negative for abdominal pain, blood in stool, diarrhea and vomiting.  Endocrine: Positive for heat intolerance. Negative for cold intolerance and polydipsia.  Genitourinary: Negative for decreased urine volume, dysuria and hematuria.  Musculoskeletal: Positive for arthralgias, back pain and gait problem.  Skin: Negative for rash.  Allergic/Immunologic: Positive for environmental allergies.  Neurological: Negative for seizures, syncope, light-headedness and headaches.  Hematological: Negative for adenopathy.  Psychiatric/Behavioral: Negative for agitation, dysphoric mood and suicidal ideas. The patient is not nervous/anxious.     Per HPI unless specifically indicated above     Objective:  BP 110/82 (BP Location: Left Arm, Patient Position: Sitting, Cuff Size: Normal)   Pulse 81   Temp 98.2 F (36.8 C) (Other (Comment))   Ht 5\' 7"  (1.702 m)   Wt 177 lb (80.3 kg)   SpO2 97%   BMI 27.72 kg/m   Wt Readings from Last 3 Encounters:  05/17/16 177 lb (80.3 kg)  05/17/16 177 lb (80.3 kg)  05/12/16 175 lb (79.4 kg)    Physical Exam  Constitutional: He is  oriented to person, place, and time. He appears well-developed and well-nourished.  HENT:  Head: Normocephalic and atraumatic.  Neck: Neck supple.  Cardiovascular: Normal rate and regular rhythm.   Pulmonary/Chest: Effort normal and breath sounds normal. He has no wheezes.  Abdominal: Soft. Bowel sounds are normal. There is no hepatosplenomegaly. There is no tenderness.  Musculoskeletal: He exhibits no edema.  Lymphadenopathy:    He has no cervical adenopathy.  Neurological: He is alert and oriented to person, place, and time.  Skin: Skin is warm and dry.  Psychiatric: He has a normal mood and affect. His behavior is normal.  Vitals reviewed.   Results for orders placed or performed in visit on 05/03/16  COMPLETE METABOLIC PANEL WITH GFR  Result Value Ref Range   Sodium 137 135 - 146 mmol/L   Potassium 4.4 3.5 - 5.3 mmol/L   Chloride 99 98 - 110 mmol/L   CO2 25 20 - 31 mmol/L   Glucose, Bld 89 65 - 99 mg/dL   BUN 12 7 - 25 mg/dL   Creat 0.26 3.78 - 5.88 mg/dL   Total Bilirubin 0.3 0.2 - 1.2 mg/dL   Alkaline Phosphatase 91 40 - 115 U/L   AST 17 10 - 35 U/L   ALT 19 9 - 46 U/L   Total Protein 5.9 (L) 6.1 - 8.1 g/dL   Albumin 3.8 3.6 - 5.1 g/dL   Calcium 8.9 8.6 - 50.2 mg/dL   GFR, Est African American >89 >=60 mL/min   GFR, Est Non African American >89 >=60 mL/min  Lipid Profile  Result Value Ref Range   Cholesterol 137 125 - 200 mg/dL   Triglycerides 774 <128 mg/dL   HDL 46 >=78 mg/dL   Total CHOL/HDL Ratio 3.0 <=5.0 Ratio   VLDL 26 <30 mg/dL   LDL Cholesterol 65 <676 mg/dL      Assessment & Plan:   Encounter Diagnoses  Name Primary?  . Acute bronchitis, unspecified organism Yes  . Cigarette nicotine dependence with nicotine-induced disorder   . Hyperlipidemia     -rx zpack.  Counseled on need for smoking cessation.  He has inhaler to use as well -continue atorvastatin and lowfat diet for lipids -pt to continue with [facility] for MH issues -pt to continue with  orthopedics for musculoskeletal issues -f/u 3 months.  RTO sooner prn

## 2016-05-17 NOTE — Progress Notes (Signed)
Patient Jacob Reilly, male DOB:01/18/1966, 50 y.o. AVW:[id number]  Chief Complaint  Patient presents with  . Follow-up    left shoulder  . Toe Injury    left great toe    HPI  Jacob Reilly is a 50 y.o. male who is seen for evaluation of the left shoulder.  He has had chronic pain in the shoulder for some time and it is not getting better.  He has prior dislocations.  He had MRI of the left shoulder on 05-11-16 showing: IMPRESSION: 1. Severe Hill-Sachs lesion involving the superior posterolateral humeral head with severe surrounding marrow edema. 2. Mild marrow edema in the anterior inferior glenoid without a fracture or definite labral tear. 3. Severe tendinosis of the supraspinatus and infraspinatus tendon with a large full-thickness tear of the posterior supraspinatus tendon and anterior infraspinatus tendon measuring 2 cm in anterior - posterior dimension with 3 cm of retraction. 4. Moderate tendinosis of the subscapularis tendon. 5. Large joint effusion with severe synovitis. 6. Humeral avulsion of the inferior glenohumeral ligament. 7. Partial-thickness cartilage loss of the glenohumeral joint.  I have gone over the results with him.  He is currently seeing Dr. Otelia Sergeant at [facility] and I will have him see one of the other physicians there for his shoulder.  I no longer do any surgery.  The patient is agreeable.  On July 27th he dropped a commercial grade heavy door on his left foot and hurt his left great toe.  He was seen in the ER.  X-rays were done.  X-rays showed: IMPRESSION: Slightly comminuted and minimally displaced fracture of the distal phalanx of the great toe with extension to the IP joint. Expected adjacent soft tissue swelling. No radiopaque foreign body.  He was given a post op shoe which he decided not to use as he did not like it.  He is using regular tennis shoes.  He has no new trauma.  He has no redness.  He keeps gauze between the great toe and second  toe.  I have reviewed the ER record, the x-rays and the x-ray report of July 27th.  He also has knee pain on the left which may need a MRI in the future.  HPI  Body mass index is 28.57 kg/m.  ROS  Review of Systems  HENT: Negative for congestion.   Respiratory: Positive for shortness of breath. Negative for cough.   Cardiovascular: Negative for chest pain and leg swelling.  Endocrine: Negative for cold intolerance.  Musculoskeletal: Positive for arthralgias, back pain and myalgias.  Allergic/Immunologic: Negative for environmental allergies.  Psychiatric/Behavioral: The patient is nervous/anxious.     Past Medical History:  Diagnosis Date  . Asthma    as child-now with allergy season only.  . Kidney calculi     Past Surgical History:  Procedure Laterality Date  . BACK SURGERY    . HYDROCELE EXCISION Left 08/01/2013   Procedure: HYDROCELECTOMY ADULT;  Surgeon: Ky Barban, MD;  Location: [facility];  Service: Urology;  Laterality: Left;  . I&D EXTREMITY  10/24/2012   Procedure: IRRIGATION AND DEBRIDEMENT EXTREMITY;  Surgeon: Tami Ribas, MD;  Location: [facility];  Service: Orthopedics;  Laterality: Right;  . OPEN REDUCTION INTERNAL FIXATION (ORIF) DISTAL RADIAL FRACTURE Right 05/16/2015   Procedure: OPEN REDUCTION INTERNAL FIXATION (ORIF) RIGHT DISTAL RADIUS FRACTURE;  Surgeon: Dairl Ponder, MD;  Location: [facility];  Service: Orthopedics;  Laterality: Right;  . OPEN REDUCTION INTERNAL FIXATION (ORIF) HAND Right thumb  . OPEN REDUCTION  INTERNAL FIXATION (ORIF) METACARPAL Left 05/16/2015   Procedure: OPEN REDUCTION INTERNAL FIXATION OF MIDDLE PHALANGEAL FRACTURE, EXTENSOR TENDON REPAIR WITH ULNAR DIGITAL NERVE REPAIR MICROSCOPICALLY;  Surgeon: Dairl Ponder, MD;  Location: [facility];  Service: Orthopedics;  Laterality: Left;  . THUMB FUSION Right     Family History  Problem Relation Age of Onset  . Diabetes Mother   . Hypertension Mother   . Obesity Mother   . Heart disease  Mother   . Kidney disease Mother   . COPD Mother   . Cancer Father     Social History Social History  Substance Use Topics  . Smoking status: Current Every Day Smoker    Packs/day: 0.25    Years: 44.00    Types: Cigarettes  . Smokeless tobacco: Former Neurosurgeon    Types: Chew  . Alcohol use No     Comment: hx heavy etoh august 2016    Allergies  Allergen Reactions  . Penicillins Other (See Comments)    Childhood Allergy.  Has patient had a PCN reaction causing immediate rash, facial/tongue/throat swelling, SOB or lightheadedness with hypotension: unknown Has patient had a PCN reaction causing severe rash involving mucus membranes or skin necrosis: unknown Has patient had a PCN reaction that required hospitalization unknown Has patient had a PCN reaction occurring within the last 10 years: unknown If all of the above answers are "NO", then may proceed with Cephalosporin use.     Current Outpatient Prescriptions  Medication Sig Dispense Refill  . albuterol (PROVENTIL HFA;VENTOLIN HFA) 108 (90 BASE) MCG/ACT inhaler Inhale 2 puffs into the lungs every 6 (six) hours as needed for wheezing or shortness of breath.    Marland Kitchen atorvastatin (LIPITOR) 20 MG tablet Take 1 tablet (20 mg total) by mouth daily. 90 tablet 1  . Brexpiprazole (REXULTI) 1 MG TABS Take 1 mg by mouth daily.    . busPIRone (BUSPAR) 5 MG tablet Take 5 mg by mouth 3 (three) times daily.    . DULoxetine (CYMBALTA) 60 MG capsule Take 60 mg by mouth daily.    Marland Kitchen HYDROcodone-acetaminophen (NORCO/VICODIN) 5-325 MG tablet Take 1 tablet by mouth every 4 (four) hours as needed. 10 tablet 0  . ibuprofen (ADVIL,MOTRIN) 600 MG tablet Take 1 tablet (600 mg total) by mouth every 6 (six) hours as needed. 30 tablet 0  . Multiple Vitamin (MULTIVITAMIN WITH MINERALS) TABS tablet Take 1 tablet by mouth daily.    . traZODone (DESYREL) 100 MG tablet Take 100 mg by mouth at bedtime.    Marland Kitchen HYDROcodone-acetaminophen (NORCO/VICODIN) 5-325 MG tablet  Take 1 tablet by mouth every 4 (four) hours as needed for moderate pain (Must last 15 days.Do not take and drive a car or use machinery.). 60 tablet 0   No current facility-administered medications for this visit.      Physical Exam  Blood pressure 130/85, pulse 76, temperature 99.1 F (37.3 C), height 5\' 6"  (1.676 m), weight 177 lb (80.3 kg).  Constitutional: overall normal hygiene, normal nutrition, well developed, normal grooming, normal body habitus. Assistive device:none  Musculoskeletal: gait and station Limp left, muscle tone and strength are normal, no tremors or atrophy is present.  .  Neurological: coordination overall normal.  Deep tendon reflex/nerve stretch intact.  Sensation normal.  Cranial nerves II-XII intact.   Skin:   normal overall no scars, lesions, ulcers or rashes. No psoriasis.  Psychiatric: Alert and oriented x 3.  Recent memory intact, remote memory unclear.  Normal mood and affect. Well groomed.  Good eye contact.  Cardiovascular: overall no swelling, no varicosities, no edema bilaterally, normal temperatures of the legs and arms, no clubbing, cyanosis and good capillary refill.  Lymphatic: palpation is normal.  The left great toe has some ecchymosis around the base of the nail but no redness.  He has swelling of the toe.  NV is intact.  Motion is decreased.  Examination of left Upper Extremity is done.  Inspection:   Overall:  Elbow non-tender without crepitus or defects, forearm non-tender without crepitus or defects, wrist non-tender without crepitus or defects, hand non-tender.    Shoulder: with glenohumeral joint tenderness, without effusion.   Upper arm: without swelling and tenderness   Range of motion:   Overall:  Full range of motion of the elbow, full range of motion of wrist and full range of motion in fingers.   Shoulder:  left  150 degrees forward flexion; 120 degrees abduction; 30 degrees internal rotation, 30 degrees external rotation,  10 degrees extension, 40 degrees adduction.   Stability:   Overall:  Shoulder, elbow and wrist stable   Strength and Tone:   Overall full shoulder muscles strength, full upper arm strength and normal upper arm bulk and tone.    The patient has been educated about the nature of the problem(s) and counseled on treatment options.  The patient appeared to understand what I have discussed and is in agreement with it.  Encounter Diagnoses  Name Primary?  . Left rotator cuff tear Yes  . Left shoulder pain   . Fracture, toe, left, closed, initial encounter     PLAN Call if any problems.  Precautions discussed.  Continue current medications.   Return to clinic to [facility] for evaluation and consideration of possible surgery of the left shoulder.   Rx for Norco 5/325 given.  Electronically Signed [name], MD 8/1/201711:59 AM

## 2016-05-18 ENCOUNTER — Ambulatory Visit
Admission: RE | Admit: 2016-05-18 | Discharge: 2016-05-18 | Disposition: A | Discharge status: Home / Self Care | Payer: No Typology Code available for payment source | Source: Ambulatory Visit | Attending: Specialist | Admitting: Specialist

## 2016-05-18 DIAGNOSIS — Z8781 Personal history of (healed) traumatic fracture: Secondary | ICD-10-CM

## 2016-05-18 DIAGNOSIS — R29890 Loss of height: Secondary | ICD-10-CM

## 2016-05-21 ENCOUNTER — Emergency Department (HOSPITAL_COMMUNITY): Payer: No Typology Code available for payment source

## 2016-05-21 ENCOUNTER — Emergency Department (HOSPITAL_COMMUNITY)
Admission: EM | Admit: 2016-05-21 | Discharge: 2016-05-21 | Disposition: A | Discharge status: Home / Self Care | Payer: No Typology Code available for payment source | Attending: Emergency Medicine | Admitting: Emergency Medicine

## 2016-05-21 ENCOUNTER — Encounter (HOSPITAL_COMMUNITY): Payer: Self-pay | Admitting: Emergency Medicine

## 2016-05-21 DIAGNOSIS — S40811A Abrasion of right upper arm, initial encounter: Secondary | ICD-10-CM | POA: Insufficient documentation

## 2016-05-21 DIAGNOSIS — S80812A Abrasion, left lower leg, initial encounter: Secondary | ICD-10-CM | POA: Insufficient documentation

## 2016-05-21 DIAGNOSIS — W19XXXA Unspecified fall, initial encounter: Secondary | ICD-10-CM

## 2016-05-21 DIAGNOSIS — Z79899 Other long term (current) drug therapy: Secondary | ICD-10-CM | POA: Insufficient documentation

## 2016-05-21 DIAGNOSIS — Z791 Long term (current) use of non-steroidal anti-inflammatories (NSAID): Secondary | ICD-10-CM | POA: Insufficient documentation

## 2016-05-21 DIAGNOSIS — S80811A Abrasion, right lower leg, initial encounter: Secondary | ICD-10-CM | POA: Insufficient documentation

## 2016-05-21 DIAGNOSIS — Y999 Unspecified external cause status: Secondary | ICD-10-CM | POA: Insufficient documentation

## 2016-05-21 DIAGNOSIS — Y939 Activity, unspecified: Secondary | ICD-10-CM | POA: Insufficient documentation

## 2016-05-21 DIAGNOSIS — Y929 Unspecified place or not applicable: Secondary | ICD-10-CM | POA: Insufficient documentation

## 2016-05-21 DIAGNOSIS — T1490XA Injury, unspecified, initial encounter: Secondary | ICD-10-CM

## 2016-05-21 DIAGNOSIS — F17210 Nicotine dependence, cigarettes, uncomplicated: Secondary | ICD-10-CM | POA: Insufficient documentation

## 2016-05-21 MED ORDER — OXYCODONE-ACETAMINOPHEN 5-325 MG PO TABS: 1.0000 | ORAL_TABLET | Freq: Four times a day (QID) | ORAL | 0 refills | Status: DC | PRN

## 2016-05-21 MED ORDER — HYDROMORPHONE HCL 1 MG/ML IJ SOLN
1.0000 mg | Freq: Once | INTRAMUSCULAR | Status: AC
  Administered 2016-05-21: 1 mg via INTRAMUSCULAR
  Filled 2016-05-21 (×2): qty 1

## 2016-05-21 NOTE — Discharge Instructions (Signed)
Follow up with Dr. Harrison next week. °
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
## 2016-05-21 NOTE — ED Notes (Signed)
Patient's abrasions to right shoulder, right elbow, right forearm, and right knee cleansed with sur-cleans and dressed with non-adherent dressing/foam tape to secure.
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
## 2016-05-24 ENCOUNTER — Telehealth: Payer: Self-pay | Admitting: Orthopedic Surgery

## 2016-05-24 NOTE — Telephone Encounter (Signed)
Josh from [facility], [place], left a voice message requesting clarification on patient's pain medication prescription.  States patient has come in with other pain medication prescriptions from the Emergency room as well.  Please call pharmacy at [phone]
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
## 2016-05-24 NOTE — Telephone Encounter (Signed)
Pharmacist called to make you aware that patient is filling multiple pain prescriptions. He has filled the Norco 5-325 that you gave him as well as a prescription for oxycodone 5/325 #20 from the Emergency Room. Pharmacist suggest you look patient up on prescription data base before refilling medication.

## 2016-05-25 NOTE — Telephone Encounter (Signed)
Two separate problems, Dr Roda ShuttersXu for potential surgery, here for ER follow up

## 2016-05-25 NOTE — Telephone Encounter (Signed)
He went to ER AFTER my prescription written on 05-17-16 and the ER gave him the oxycodone on 05-23-16.  I have made a note of this.  The Rx from the ER should have been the one questioned.  Thanks   I appreciate all notices about Rx possible abuse.

## 2016-05-26 ENCOUNTER — Ambulatory Visit (INDEPENDENT_AMBULATORY_CARE_PROVIDER_SITE_OTHER): Payer: Self-pay | Admitting: Orthopedic Surgery

## 2016-05-26 VITALS — BP 118/81 | HR 68 | Ht 66.0 in | Wt 175.2 lb

## 2016-05-26 DIAGNOSIS — S42001A Fracture of unspecified part of right clavicle, initial encounter for closed fracture: Secondary | ICD-10-CM

## 2016-05-26 DIAGNOSIS — S42101A Fracture of unspecified part of scapula, right shoulder, initial encounter for closed fracture: Secondary | ICD-10-CM

## 2016-05-26 MED ORDER — OXYCODONE-ACETAMINOPHEN 5-325 MG PO TABS: 1.0000 | ORAL_TABLET | Freq: Four times a day (QID) | ORAL | 0 refills | Status: DC | PRN
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
## 2016-05-29 ENCOUNTER — Encounter (HOSPITAL_COMMUNITY): Payer: Self-pay | Admitting: Emergency Medicine

## 2016-05-29 ENCOUNTER — Emergency Department (HOSPITAL_COMMUNITY)
Admission: EM | Admit: 2016-05-29 | Discharge: 2016-05-29 | Disposition: A | Discharge status: Home / Self Care | Payer: Self-pay | Attending: Emergency Medicine | Admitting: Emergency Medicine

## 2016-05-29 DIAGNOSIS — Z79899 Other long term (current) drug therapy: Secondary | ICD-10-CM | POA: Insufficient documentation

## 2016-05-29 DIAGNOSIS — F17210 Nicotine dependence, cigarettes, uncomplicated: Secondary | ICD-10-CM | POA: Insufficient documentation

## 2016-05-29 DIAGNOSIS — M25511 Pain in right shoulder: Secondary | ICD-10-CM | POA: Insufficient documentation

## 2016-05-29 DIAGNOSIS — J45909 Unspecified asthma, uncomplicated: Secondary | ICD-10-CM | POA: Insufficient documentation

## 2016-05-29 DIAGNOSIS — Z791 Long term (current) use of non-steroidal anti-inflammatories (NSAID): Secondary | ICD-10-CM | POA: Insufficient documentation

## 2016-05-29 MED ORDER — OXYCODONE-ACETAMINOPHEN 5-325 MG PO TABS
2.0000 | ORAL_TABLET | Freq: Once | ORAL | Status: AC
  Administered 2016-05-29: 2 via ORAL
  Filled 2016-05-29: qty 2

## 2016-05-29 NOTE — ED Provider Notes (Signed)
[facility] Provider Note   CSN: [id number] Arrival date & time: 05/29/16  1115  First Provider Contact:  None       History   Chief Complaint Chief Complaint  Patient presents with  . Shoulder Pain    HPI Loranzo Desha is a 50 y.o. male.  HPI  Pt was seen at 1150.  Per pt, c/o gradual onset and persistence of constant right shoulder "pain" that began after running out of pain meds several days ago. Pt states he has a rx, but "can't fill it." Pt came to the ED to "fill the rx." Denies new injury, no tingling/numbness in extremities.    Past Medical History:  Diagnosis Date  . Asthma    as child-now with allergy season only.  . Kidney calculi     Patient Active Problem List   Diagnosis Date Noted  . Hyperlipidemia 02/10/2016  . Low back pain 02/10/2016  . Cigarette nicotine dependence with nicotine-induced disorder 02/10/2016  . Chronic pain 01/15/2016  . Anxiety disorder 01/15/2016  . Polysubstance abuse 04/06/2015  . Substance induced mood disorder (HCC) 04/06/2015  . Homicidal ideation   . MRSA infection 12/24/2012  . Smoker 12/24/2012  . Habitual alcohol use 12/24/2012  . Finger osteomyelitis, right (HCC) 11/26/2012    Past Surgical History:  Procedure Laterality Date  . BACK SURGERY    . HYDROCELE EXCISION Left 08/01/2013   Procedure: HYDROCELECTOMY ADULT;  Surgeon: Ky Barban, MD;  Location: [facility];  Service: Urology;  Laterality: Left;  . I&D EXTREMITY  10/24/2012   Procedure: IRRIGATION AND DEBRIDEMENT EXTREMITY;  Surgeon: Tami Ribas, MD;  Location: [facility];  Service: Orthopedics;  Laterality: Right;  . OPEN REDUCTION INTERNAL FIXATION (ORIF) DISTAL RADIAL FRACTURE Right 05/16/2015   Procedure: OPEN REDUCTION INTERNAL FIXATION (ORIF) RIGHT DISTAL RADIUS FRACTURE;  Surgeon: Dairl Ponder, MD;  Location: [facility];  Service: Orthopedics;  Laterality: Right;  . OPEN REDUCTION INTERNAL FIXATION (ORIF) HAND Right thumb  . OPEN REDUCTION INTERNAL  FIXATION (ORIF) METACARPAL Left 05/16/2015   Procedure: OPEN REDUCTION INTERNAL FIXATION OF MIDDLE PHALANGEAL FRACTURE, EXTENSOR TENDON REPAIR WITH ULNAR DIGITAL NERVE REPAIR MICROSCOPICALLY;  Surgeon: Dairl Ponder, MD;  Location: [facility];  Service: Orthopedics;  Laterality: Left;  . THUMB FUSION Right        Home Medications    Prior to Admission medications   Medication Sig Start Date End Date Taking? Authorizing Provider  albuterol (PROVENTIL HFA;VENTOLIN HFA) 108 (90 BASE) MCG/ACT inhaler Inhale 2 puffs into the lungs every 6 (six) hours as needed for wheezing or shortness of breath.    Historical Provider, MD  atorvastatin (LIPITOR) 20 MG tablet Take 1 tablet (20 mg total) by mouth daily. 02/10/16   [name], PA-C  Brexpiprazole (REXULTI) 1 MG TABS Take 1 mg by mouth daily.    Historical Provider, MD  busPIRone (BUSPAR) 5 MG tablet Take 5 mg by mouth 3 (three) times daily.    Historical Provider, MD  DULoxetine (CYMBALTA) 60 MG capsule Take 60 mg by mouth daily.    Historical Provider, MD  HYDROcodone-acetaminophen (NORCO/VICODIN) 5-325 MG tablet Take 1 tablet by mouth every 4 (four) hours as needed. 05/12/16   [name], PA-C  HYDROcodone-acetaminophen (NORCO/VICODIN) 5-325 MG tablet Take 1 tablet by mouth every 4 (four) hours as needed for moderate pain (Must last 15 days.Do not take and drive a car or use machinery.). 05/17/16   [name], MD  ibuprofen (ADVIL,MOTRIN) 600 MG tablet Take 1 tablet (  600 mg total) by mouth every 6 (six) hours as needed. 05/12/16   Ivery QualeHobson Bryant, PA-C  Multiple Vitamin (MULTIVITAMIN WITH MINERALS) TABS tablet Take 1 tablet by mouth daily.    Historical Provider, MD  oxyCODONE-acetaminophen (PERCOCET/ROXICET) 5-325 MG tablet Take 1 tablet by mouth every 6 (six) hours as needed. 05/26/16   Vickki HearingStanley E Harrison, MD  traZODone (DESYREL) 100 MG tablet Take 100 mg by mouth at bedtime.    Historical Provider, MD    Family History Family History    Problem Relation Age of Onset  . Diabetes Mother   . Hypertension Mother   . Obesity Mother   . Heart disease Mother   . Kidney disease Mother   . COPD Mother   . Cancer Father     Social History Social History  Substance Use Topics  . Smoking status: Current Every Day Smoker    Packs/day: 0.25    Years: 44.00    Types: Cigarettes  . Smokeless tobacco: Former NeurosurgeonUser    Types: Chew  . Alcohol use 4.8 oz/week    8 Cans of beer per week     Comment: hx heavy etoh august 2016     Allergies   Penicillins   Review of Systems Review of Systems ROS: Statement: All systems negative except as marked or noted in the HPI; Constitutional: Negative for fever and chills. ; ; Eyes: Negative for eye pain, redness and discharge. ; ; ENMT: Negative for ear pain, hoarseness, nasal congestion, sinus pressure and sore throat. ; ; Cardiovascular: Negative for chest pain, palpitations, diaphoresis, dyspnea and peripheral edema. ; ; Respiratory: Negative for cough, wheezing and stridor. ; ; Gastrointestinal: Negative for nausea, vomiting, diarrhea, abdominal pain, blood in stool, hematemesis, jaundice and rectal bleeding. . ; ; Genitourinary: Negative for dysuria, flank pain and hematuria. ; ; Musculoskeletal: +shoulder pain. Negative for back pain and neck pain. Negative for swelling and new trauma.; ; Skin: Negative for pruritus, rash, abrasions, blisters, bruising and skin lesion.; ; Neuro: Negative for headache, lightheadedness and neck stiffness. Negative for weakness, altered level of consciousness, altered mental status, extremity weakness, paresthesias, involuntary movement, seizure and syncope.      Physical Exam Updated Vital Signs BP 128/88 (BP Location: Left Arm)   Pulse 90   Temp 98.1 F (36.7 C) (Oral)   Resp 20   SpO2 98%   Physical Exam 1155: Physical examination:  Nursing notes reviewed; Vital signs and O2 SAT reviewed;  Constitutional: Well developed, Well nourished, Well  hydrated, In no acute distress; Head:  Normocephalic, atraumatic; Eyes: EOMI, PERRL, No scleral icterus; ENMT: Mouth and pharynx normal, Mucous membranes moist; Neck: Supple, Full range of motion; Cardiovascular: Regular rate and rhythm; Respiratory: Breath sounds clear, No wheezes.  Speaking full sentences with ease, Normal respiratory effort/excursion; Chest: No deformity, Movement normal; Abdomen: Nondistended; Extremities: No deformity.; Neuro: AA&Ox3, Major CN grossly intact.  Speech clear. +right arm in sling, otherwise no gross focal motor deficits in extremities. Climbs on and off stretcher easily by himself. Gait steady.; Skin: Color normal, Warm, Dry.     ED Treatments / Results  Labs (all labs ordered are listed, but only abnormal results are displayed)   EKG  EKG Interpretation None       Radiology   Procedures Procedures (including critical care time)  Medications Ordered in ED Medications  oxyCODONE-acetaminophen (PERCOCET/ROXICET) 5-325 MG per tablet 2 tablet (not administered)     Initial Impression / Assessment and Plan / ED Course  I have reviewed the triage vital signs and the nursing notes.  Pertinent labs & imaging results that were available during my care of the patient were reviewed by me and considered in my medical decision making (see chart for details).  MDM Reviewed: previous chart, nursing note and vitals Reviewed previous: x-ray   1230:  EPIC Chart reviewed: pt received rx percocet from Dr. Romeo Apple at f/u office visit on 05/26/16. Pt does admit he has this prescription but "can't get it filled." Pt requesting ED "fill the rx." Pt informed by ED RN and myself regarding inability to do so. Will give dose of meds here. Pt encouraged to f/u with Ortho MD. Pt verb understanding.        Final Clinical Impressions(s) / ED Diagnoses   Final diagnoses:  None    New Prescriptions New Prescriptions   No medications on file     Samuel Jester, DO 05/31/16 1648

## 2016-05-29 NOTE — Discharge Instructions (Signed)
Fill your pain medication prescription.  Wear the sling as instructed by your Orthopedist. Call your regular medical doctor tomorrow to schedule a follow up appointment this week.  Return to the Emergency Department immediately sooner if worsening.
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
## 2016-06-01 ENCOUNTER — Encounter: Payer: Self-pay | Admitting: Orthopedic Surgery

## 2016-06-01 ENCOUNTER — Other Ambulatory Visit: Payer: Self-pay | Admitting: Orthopaedic Surgery

## 2016-06-01 ENCOUNTER — Ambulatory Visit (INDEPENDENT_AMBULATORY_CARE_PROVIDER_SITE_OTHER): Payer: Self-pay | Admitting: Orthopedic Surgery

## 2016-06-01 ENCOUNTER — Ambulatory Visit (INDEPENDENT_AMBULATORY_CARE_PROVIDER_SITE_OTHER): Payer: Self-pay

## 2016-06-01 ENCOUNTER — Encounter (HOSPITAL_BASED_OUTPATIENT_CLINIC_OR_DEPARTMENT_OTHER): Payer: Self-pay | Admitting: *Deleted

## 2016-06-01 VITALS — BP 123/78 | HR 80 | Ht 66.0 in | Wt 175.0 lb

## 2016-06-01 DIAGNOSIS — S42101D Fracture of unspecified part of scapula, right shoulder, subsequent encounter for fracture with routine healing: Secondary | ICD-10-CM

## 2016-06-01 DIAGNOSIS — S42001D Fracture of unspecified part of right clavicle, subsequent encounter for fracture with routine healing: Secondary | ICD-10-CM

## 2016-06-01 NOTE — Progress Notes (Signed)
This is a follow-up visit fracture care  Scapular fracture and clavicle fracture right shoulder  X-rays show no change in the position of the fracture in the scapula or the clavicle  He remains neurovascularly intact  He scheduled to have a rotator cuff repair with [facility] but we will see him in 2 weeks for repeat x-ray and then after that we should be able to start gentle range of motion exercises on the right shoulder

## 2016-06-02 ENCOUNTER — Other Ambulatory Visit: Payer: Self-pay | Admitting: *Deleted

## 2016-06-02 ENCOUNTER — Telehealth: Payer: Self-pay | Admitting: Orthopedic Surgery

## 2016-06-02 MED ORDER — OXYCODONE-ACETAMINOPHEN 5-325 MG PO TABS: 1.0000 | ORAL_TABLET | Freq: Four times a day (QID) | ORAL | 0 refills | Status: DC | PRN

## 2016-06-02 NOTE — Telephone Encounter (Signed)
This should go to the nurse   Check when refill is due

## 2016-06-02 NOTE — Telephone Encounter (Signed)
Patient called to inquire about refill of pain medication at time of 06/01/16 office visit - states was filled on 05/29/16:  oxyCODONE-acetaminophen (PERCOCET/ROXICET) 5-325 MG tablet 28 tablet 0 05/26/2016

## 2016-06-02 NOTE — Telephone Encounter (Signed)
Prescription available, patient aware  

## 2016-06-02 NOTE — Telephone Encounter (Signed)
Patient was seen as scheduled for ER follow up 05/26/16 and 06/01/16 as scheduled also.

## 2016-06-07 ENCOUNTER — Encounter (HOSPITAL_BASED_OUTPATIENT_CLINIC_OR_DEPARTMENT_OTHER): Payer: Self-pay | Admitting: *Deleted

## 2016-06-08 ENCOUNTER — Ambulatory Visit (HOSPITAL_BASED_OUTPATIENT_CLINIC_OR_DEPARTMENT_OTHER)
Admission: RE | Admit: 2016-06-08 | Discharge: 2016-06-08 | Disposition: A | Discharge status: Home / Self Care | Payer: Self-pay | Source: Ambulatory Visit | Attending: Orthopaedic Surgery | Admitting: Orthopaedic Surgery

## 2016-06-08 ENCOUNTER — Ambulatory Visit (HOSPITAL_BASED_OUTPATIENT_CLINIC_OR_DEPARTMENT_OTHER): Payer: Self-pay | Admitting: Certified Registered"

## 2016-06-08 ENCOUNTER — Encounter (HOSPITAL_BASED_OUTPATIENT_CLINIC_OR_DEPARTMENT_OTHER): Payer: Self-pay | Admitting: Certified Registered"

## 2016-06-08 ENCOUNTER — Encounter (HOSPITAL_BASED_OUTPATIENT_CLINIC_OR_DEPARTMENT_OTHER)
Admission: RE | Disposition: A | Discharge status: Home / Self Care | Payer: Self-pay | Source: Ambulatory Visit | Attending: Orthopaedic Surgery

## 2016-06-08 DIAGNOSIS — J45909 Unspecified asthma, uncomplicated: Secondary | ICD-10-CM | POA: Insufficient documentation

## 2016-06-08 DIAGNOSIS — M7542 Impingement syndrome of left shoulder: Secondary | ICD-10-CM | POA: Insufficient documentation

## 2016-06-08 DIAGNOSIS — F4310 Post-traumatic stress disorder, unspecified: Secondary | ICD-10-CM | POA: Insufficient documentation

## 2016-06-08 DIAGNOSIS — X58XXXA Exposure to other specified factors, initial encounter: Secondary | ICD-10-CM | POA: Insufficient documentation

## 2016-06-08 DIAGNOSIS — M75102 Unspecified rotator cuff tear or rupture of left shoulder, not specified as traumatic: Secondary | ICD-10-CM | POA: Insufficient documentation

## 2016-06-08 DIAGNOSIS — M659 Synovitis and tenosynovitis, unspecified: Secondary | ICD-10-CM | POA: Insufficient documentation

## 2016-06-08 DIAGNOSIS — E785 Hyperlipidemia, unspecified: Secondary | ICD-10-CM | POA: Insufficient documentation

## 2016-06-08 DIAGNOSIS — F17210 Nicotine dependence, cigarettes, uncomplicated: Secondary | ICD-10-CM | POA: Insufficient documentation

## 2016-06-08 DIAGNOSIS — S46112A Strain of muscle, fascia and tendon of long head of biceps, left arm, initial encounter: Secondary | ICD-10-CM | POA: Insufficient documentation

## 2016-06-08 DIAGNOSIS — Z79899 Other long term (current) drug therapy: Secondary | ICD-10-CM | POA: Insufficient documentation

## 2016-06-08 DIAGNOSIS — F419 Anxiety disorder, unspecified: Secondary | ICD-10-CM | POA: Insufficient documentation

## 2016-06-08 DIAGNOSIS — F329 Major depressive disorder, single episode, unspecified: Secondary | ICD-10-CM | POA: Insufficient documentation

## 2016-06-08 DIAGNOSIS — S43432A Superior glenoid labrum lesion of left shoulder, initial encounter: Secondary | ICD-10-CM | POA: Insufficient documentation

## 2016-06-08 HISTORY — PX: SHOULDER ARTHROSCOPY WITH ROTATOR CUFF REPAIR AND SUBACROMIAL DECOMPRESSION: SHX5686

## 2016-06-08 HISTORY — DX: Low back pain: M54.5

## 2016-06-08 HISTORY — DX: Depression, unspecified: F32.A

## 2016-06-08 HISTORY — DX: Pneumonia, unspecified organism: J18.9

## 2016-06-08 HISTORY — DX: Anxiety disorder, unspecified: F41.9

## 2016-06-08 HISTORY — DX: Low back pain, unspecified: M54.50

## 2016-06-08 HISTORY — DX: Major depressive disorder, single episode, unspecified: F32.9

## 2016-06-08 HISTORY — DX: Alcohol abuse, uncomplicated: F10.10

## 2016-06-08 HISTORY — DX: Other psychoactive substance abuse, uncomplicated: F19.10

## 2016-06-08 HISTORY — DX: Hyperlipidemia, unspecified: E78.5

## 2016-06-08 HISTORY — DX: Post-traumatic stress disorder, unspecified: F43.10

## 2016-06-08 SURGERY — SHOULDER ARTHROSCOPY WITH ROTATOR CUFF REPAIR AND SUBACROMIAL DECOMPRESSION
Anesthesia: Regional | Site: Shoulder | Laterality: Left

## 2016-06-08 MED ORDER — FENTANYL CITRATE (PF) 100 MCG/2ML IJ SOLN
50.0000 ug | INTRAMUSCULAR | Status: DC | PRN
  Administered 2016-06-08: 100 ug via INTRAVENOUS
  Administered 2016-06-08: 50 ug via INTRAVENOUS

## 2016-06-08 MED ORDER — SENNOSIDES-DOCUSATE SODIUM 8.6-50 MG PO TABS: 1.0000 | ORAL_TABLET | Freq: Every evening | ORAL | 1 refills | Status: DC | PRN

## 2016-06-08 MED ORDER — CLINDAMYCIN PHOSPHATE 900 MG/50ML IV SOLN
INTRAVENOUS | Status: AC
  Filled 2016-06-08: qty 50

## 2016-06-08 MED ORDER — BUPIVACAINE-EPINEPHRINE (PF) 0.5% -1:200000 IJ SOLN
INTRAMUSCULAR | Status: DC | PRN
  Administered 2016-06-08: 30 mL via PERINEURAL

## 2016-06-08 MED ORDER — CLINDAMYCIN PHOSPHATE 900 MG/50ML IV SOLN
900.0000 mg | INTRAVENOUS | Status: AC
  Administered 2016-06-08: 900 mg via INTRAVENOUS

## 2016-06-08 MED ORDER — ONDANSETRON HCL 4 MG PO TABS: 4.0000 mg | ORAL_TABLET | Freq: Three times a day (TID) | ORAL | 0 refills | Status: DC | PRN

## 2016-06-08 MED ORDER — SUCCINYLCHOLINE CHLORIDE 20 MG/ML IJ SOLN
INTRAMUSCULAR | Status: DC | PRN
  Administered 2016-06-08: 80 mg via INTRAVENOUS

## 2016-06-08 MED ORDER — ONDANSETRON HCL 4 MG/2ML IJ SOLN
INTRAMUSCULAR | Status: AC
  Filled 2016-06-08: qty 2

## 2016-06-08 MED ORDER — SCOPOLAMINE 1 MG/3DAYS TD PT72: 1.0000 | MEDICATED_PATCH | Freq: Once | TRANSDERMAL | Status: DC | PRN

## 2016-06-08 MED ORDER — ALBUTEROL SULFATE (2.5 MG/3ML) 0.083% IN NEBU
INHALATION_SOLUTION | RESPIRATORY_TRACT | Status: AC
  Filled 2016-06-08: qty 3

## 2016-06-08 MED ORDER — METHOCARBAMOL 750 MG PO TABS: 750.0000 mg | ORAL_TABLET | Freq: Two times a day (BID) | ORAL | 0 refills | Status: DC | PRN

## 2016-06-08 MED ORDER — ONDANSETRON HCL 4 MG/2ML IJ SOLN
INTRAMUSCULAR | Status: DC | PRN
  Administered 2016-06-08: 4 mg via INTRAVENOUS

## 2016-06-08 MED ORDER — SODIUM CHLORIDE 0.9 % IR SOLN
Status: DC | PRN
  Administered 2016-06-08: 27000 mL

## 2016-06-08 MED ORDER — ALBUTEROL SULFATE (2.5 MG/3ML) 0.083% IN NEBU
2.5000 mg | INHALATION_SOLUTION | Freq: Once | RESPIRATORY_TRACT | Status: AC
  Administered 2016-06-08: 2.5 mg via RESPIRATORY_TRACT

## 2016-06-08 MED ORDER — ARTIFICIAL TEARS OP OINT
TOPICAL_OINTMENT | OPHTHALMIC | Status: DC | PRN
  Administered 2016-06-08: 1 via OPHTHALMIC

## 2016-06-08 MED ORDER — FENTANYL CITRATE (PF) 100 MCG/2ML IJ SOLN
INTRAMUSCULAR | Status: AC
  Filled 2016-06-08: qty 2

## 2016-06-08 MED ORDER — ARTIFICIAL TEARS OP OINT
TOPICAL_OINTMENT | OPHTHALMIC | Status: AC
  Filled 2016-06-08: qty 3.5

## 2016-06-08 MED ORDER — LACTATED RINGERS IV SOLN
INTRAVENOUS | Status: DC
  Administered 2016-06-08 (×2): via INTRAVENOUS

## 2016-06-08 MED ORDER — DEXAMETHASONE SODIUM PHOSPHATE 4 MG/ML IJ SOLN
INTRAMUSCULAR | Status: DC | PRN
  Administered 2016-06-08: 10 mg via INTRAVENOUS

## 2016-06-08 MED ORDER — DEXAMETHASONE SODIUM PHOSPHATE 10 MG/ML IJ SOLN
INTRAMUSCULAR | Status: AC
  Filled 2016-06-08: qty 1

## 2016-06-08 MED ORDER — MEPERIDINE HCL 25 MG/ML IJ SOLN: 6.2500 mg | INTRAMUSCULAR | Status: DC | PRN

## 2016-06-08 MED ORDER — FENTANYL CITRATE (PF) 100 MCG/2ML IJ SOLN: 25.0000 ug | INTRAMUSCULAR | Status: DC | PRN

## 2016-06-08 MED ORDER — MIDAZOLAM HCL 2 MG/2ML IJ SOLN
INTRAMUSCULAR | Status: AC
  Filled 2016-06-08: qty 2

## 2016-06-08 MED ORDER — OXYCODONE-ACETAMINOPHEN 5-325 MG PO TABS: 1.0000 | ORAL_TABLET | ORAL | 0 refills | Status: DC | PRN

## 2016-06-08 MED ORDER — LIDOCAINE HCL 4 % EX SOLN
CUTANEOUS | Status: DC | PRN
  Administered 2016-06-08: 2 mL via TOPICAL

## 2016-06-08 MED ORDER — PROPOFOL 10 MG/ML IV BOLUS
INTRAVENOUS | Status: DC | PRN
  Administered 2016-06-08: 200 mg via INTRAVENOUS

## 2016-06-08 MED ORDER — LIDOCAINE HCL 4 % EX SOLN: CUTANEOUS | Status: DC | PRN

## 2016-06-08 MED ORDER — OXYCODONE HCL 5 MG PO TABS
5.0000 mg | ORAL_TABLET | Freq: Once | ORAL | Status: AC
  Administered 2016-06-08: 5 mg via ORAL

## 2016-06-08 MED ORDER — PROPOFOL 10 MG/ML IV BOLUS
INTRAVENOUS | Status: AC
  Filled 2016-06-08: qty 20

## 2016-06-08 MED ORDER — METOCLOPRAMIDE HCL 5 MG/ML IJ SOLN: 10.0000 mg | Freq: Once | INTRAMUSCULAR | Status: DC | PRN

## 2016-06-08 MED ORDER — SUCCINYLCHOLINE CHLORIDE 200 MG/10ML IV SOSY
PREFILLED_SYRINGE | INTRAVENOUS | Status: AC
  Filled 2016-06-08: qty 10

## 2016-06-08 MED ORDER — LIDOCAINE 2% (20 MG/ML) 5 ML SYRINGE
INTRAMUSCULAR | Status: AC
  Filled 2016-06-08: qty 5

## 2016-06-08 MED ORDER — GLYCOPYRROLATE 0.2 MG/ML IJ SOLN: 0.2000 mg | Freq: Once | INTRAMUSCULAR | Status: DC | PRN

## 2016-06-08 MED ORDER — OXYCODONE HCL ER 10 MG PO T12A: 10.0000 mg | EXTENDED_RELEASE_TABLET | Freq: Two times a day (BID) | ORAL | 0 refills | Status: DC

## 2016-06-08 MED ORDER — MIDAZOLAM HCL 2 MG/2ML IJ SOLN
1.0000 mg | INTRAMUSCULAR | Status: DC | PRN
  Administered 2016-06-08: 2 mg via INTRAVENOUS

## 2016-06-08 MED ORDER — LACTATED RINGERS IV SOLN: INTRAVENOUS | Status: DC

## 2016-06-08 MED ORDER — OXYCODONE HCL 5 MG PO TABS
ORAL_TABLET | ORAL | Status: AC
  Filled 2016-06-08: qty 1

## 2016-06-08 SURGICAL SUPPLY — 65 items
ANCHOR PEEK SWIVEL LOCK 5.5 (Anchor) ×6 IMPLANT
ANCHOR SUT BIO SW 4.75X19.1 (Anchor) ×3 IMPLANT
BENZOIN TINCTURE PRP APPL 2/3 (GAUZE/BANDAGES/DRESSINGS) IMPLANT
BLADE 4.2CUDA (BLADE) ×3 IMPLANT
BLADE CUTTER GATOR 3.5 (BLADE) IMPLANT
BLADE GREAT WHITE 4.2 (BLADE) IMPLANT
BLADE GREAT WHITE 4.2MM (BLADE)
BLADE SURG 15 STRL LF DISP TIS (BLADE) IMPLANT
BLADE SURG 15 STRL SS (BLADE)
BUR OVAL 4.0 (BURR) ×3 IMPLANT
CANNULA 5.75X71 LONG (CANNULA) ×3 IMPLANT
CANNULA TWIST IN 8.25X7CM (CANNULA) IMPLANT
CANNULA TWIST IN 8.25X9CM (CANNULA) IMPLANT
CLOSURE STERI-STRIP 1/2X4 (GAUZE/BANDAGES/DRESSINGS)
CLSR STERI-STRIP ANTIMIC 1/2X4 (GAUZE/BANDAGES/DRESSINGS) IMPLANT
DECANTER SPIKE VIAL GLASS SM (MISCELLANEOUS) IMPLANT
DRAPE IMP U-DRAPE 54X76 (DRAPES) ×3 IMPLANT
DRAPE INCISE IOBAN 66X45 STRL (DRAPES) ×3 IMPLANT
DRAPE STERI 35X30 U-POUCH (DRAPES) ×3 IMPLANT
DRAPE SURG 17X23 STRL (DRAPES) ×3 IMPLANT
DRAPE U-SHAPE 47X51 STRL (DRAPES) ×3 IMPLANT
DRAPE U-SHAPE 76X120 STRL (DRAPES) ×6 IMPLANT
DRSG PAD ABDOMINAL 8X10 ST (GAUZE/BANDAGES/DRESSINGS) ×6 IMPLANT
DURAPREP 26ML APPLICATOR (WOUND CARE) ×3 IMPLANT
ELECT REM PT RETURN 9FT ADLT (ELECTROSURGICAL)
ELECTRODE REM PT RTRN 9FT ADLT (ELECTROSURGICAL) IMPLANT
GAUZE SPONGE 4X4 12PLY STRL (GAUZE/BANDAGES/DRESSINGS) ×3 IMPLANT
GAUZE XEROFORM 1X8 LF (GAUZE/BANDAGES/DRESSINGS) ×3 IMPLANT
GLOVE BIOGEL PI IND STRL 7.0 (GLOVE) ×2 IMPLANT
GLOVE BIOGEL PI INDICATOR 7.0 (GLOVE) ×4
GLOVE ECLIPSE 6.5 STRL STRAW (GLOVE) ×6 IMPLANT
GLOVE SKINSENSE NS SZ7.5 (GLOVE) ×2
GLOVE SKINSENSE STRL SZ7.5 (GLOVE) ×1 IMPLANT
GLOVE SURG SYN 7.5  E (GLOVE) ×2
GLOVE SURG SYN 7.5 E (GLOVE) ×1 IMPLANT
GOWN STRL REIN XL XLG (GOWN DISPOSABLE) ×3 IMPLANT
GOWN STRL REUS W/ TWL LRG LVL3 (GOWN DISPOSABLE) ×1 IMPLANT
GOWN STRL REUS W/TWL LRG LVL3 (GOWN DISPOSABLE) ×2
IMMOBILIZER SHOULDER FOAM XLGE (SOFTGOODS) IMPLANT
KIT SHOULDER TRACTION (DRAPES) ×3 IMPLANT
MANIFOLD NEPTUNE II (INSTRUMENTS) ×3 IMPLANT
NEEDLE SCORPION MULTI FIRE (NEEDLE) IMPLANT
PACK ARTHROSCOPY DSU (CUSTOM PROCEDURE TRAY) ×3 IMPLANT
PACK BASIN DAY SURGERY FS (CUSTOM PROCEDURE TRAY) ×3 IMPLANT
SET ARTHROSCOPY TUBING (MISCELLANEOUS) ×2
SET ARTHROSCOPY TUBING LN (MISCELLANEOUS) ×1 IMPLANT
SHEET MEDIUM DRAPE 40X70 STRL (DRAPES) ×3 IMPLANT
SLEEVE SCD COMPRESS KNEE MED (MISCELLANEOUS) ×3 IMPLANT
SLING ARM FOAM STRAP LRG (SOFTGOODS) IMPLANT
SLING ARM IMMOBILIZER LRG (SOFTGOODS) ×3 IMPLANT
SLING ARM IMMOBILIZER MED (SOFTGOODS) IMPLANT
SLING ARM MED ADULT FOAM STRAP (SOFTGOODS) IMPLANT
SLING ARM XL FOAM STRAP (SOFTGOODS) IMPLANT
SUT ETHILON 3 0 PS 1 (SUTURE) ×3 IMPLANT
SUT FIBERWIRE #2 38 T-5 BLUE (SUTURE)
SUT TIGER TAPE 7 IN WHITE (SUTURE) IMPLANT
SUTURE FIBERWR #2 38 T-5 BLUE (SUTURE) IMPLANT
SYR 50ML LL SCALE MARK (SYRINGE) IMPLANT
TAPE FIBER 2MM 7IN #2 BLUE (SUTURE) IMPLANT
TOWEL OR 17X24 6PK STRL BLUE (TOWEL DISPOSABLE) ×3 IMPLANT
TOWEL OR NON WOVEN STRL DISP B (DISPOSABLE) IMPLANT
TUBE CONNECTING 20'X1/4 (TUBING)
TUBE CONNECTING 20X1/4 (TUBING) IMPLANT
WAND STAR VAC 90 (SURGICAL WAND) ×3 IMPLANT
WATER STERILE IRR 1000ML POUR (IV SOLUTION) ×3 IMPLANT

## 2016-06-08 NOTE — Anesthesia Preprocedure Evaluation (Addendum)
Anesthesia Evaluation  Patient identified by MRN, date of birth, ID band Patient awake    Reviewed: Allergy & Precautions, NPO status , Patient's Chart, lab work & pertinent test results  Airway Mallampati: II  TM Distance: >3 FB Neck ROM: Full    Dental no notable dental hx.    Pulmonary asthma , Current Smoker,     + wheezing      Cardiovascular negative cardio ROS Normal cardiovascular exam Rhythm:Regular Rate:Normal     Neuro/Psych negative neurological ROS  negative psych ROS   GI/Hepatic negative GI ROS, (+)     substance abuse  alcohol use, cocaine use and marijuana use,   Endo/Other  negative endocrine ROS  Renal/GU negative Renal ROS  negative genitourinary   Musculoskeletal negative musculoskeletal ROS (+)   Abdominal   Peds negative pediatric ROS (+)  Hematology negative hematology ROS (+)   Anesthesia Other Findings   Reproductive/Obstetrics negative OB ROS                           Anesthesia Physical Anesthesia Plan  ASA: III  Anesthesia Plan: General   Post-op Pain Management: GA combined w/ Regional for post-op pain   Induction: Intravenous  Airway Management Planned: Oral ETT  Additional Equipment:   Intra-op Plan:   Post-operative Plan: Extubation in OR  Informed Consent: I have reviewed the patients History and Physical, chart, labs and discussed the procedure including the risks, benefits and alternatives for the proposed anesthesia with the patient or authorized representative who has indicated his/her understanding and acceptance.   Dental advisory given  Plan Discussed with: CRNA  Anesthesia Plan Comments: (Risk of pulmonary complications with block discussed. Patient adamant about wanting block for pain relief despite risks.)        Anesthesia Quick Evaluation

## 2016-06-08 NOTE — Anesthesia Postprocedure Evaluation (Signed)
Anesthesia Post Note  Patient: [name]  Procedure(s) Performed: Procedure(s) (LRB): LEFT SHOULDER ARTHROSCOPY WITH ROTATOR CUFF REPAIR, SUBACROMIAL DECOMPRESSION, DISTAL CLAVICLE EXCISION (Left)  Patient location during evaluation: PACU Anesthesia Type: General and Regional Level of consciousness: sedated and patient cooperative Pain management: pain level controlled Vital Signs Assessment: post-procedure vital signs reviewed and stable Respiratory status: spontaneous breathing Cardiovascular status: stable Anesthetic complications: no    Last Vitals:  Vitals:   06/08/16 1400 06/08/16 1415  BP: 107/68 109/76  Pulse: 80 80  Resp: 15 15  Temp:      Last Pain:  Vitals:   06/08/16 1400  TempSrc:   PainSc: Asleep                 Lewie LoronJohn Letasha Kershaw

## 2016-06-08 NOTE — H&P (Signed)
PREOPERATIVE H&P  Chief Complaint: left shoulder rotator cuff tear  HPI: Jacob Reilly is a 50 y.o. male who presents for surgical treatment of left shoulder rotator cuff tear.  He denies any changes in medical history.  Past Medical History:  Diagnosis Date  . Anxiety   . Asthma    as child-now with allergy season only.  . Depression   . ETOH abuse   . Hyperlipidemia   . Kidney calculi   . Low back pain   . Pneumonia   . Polysubstance abuse   . PTSD (post-traumatic stress disorder)    Past Surgical History:  Procedure Laterality Date  . BACK SURGERY    . HYDROCELE EXCISION Left 08/01/2013   Procedure: HYDROCELECTOMY ADULT;  Surgeon: Ky BarbanMohammad I Javaid, MD;  Location: [facility];  Service: Urology;  Laterality: Left;  . I&D EXTREMITY  10/24/2012   Procedure: IRRIGATION AND DEBRIDEMENT EXTREMITY;  Surgeon: Tami RibasKevin R Kuzma, MD;  Location: [facility];  Service: Orthopedics;  Laterality: Right;  . OPEN REDUCTION INTERNAL FIXATION (ORIF) DISTAL RADIAL FRACTURE Right 05/16/2015   Procedure: OPEN REDUCTION INTERNAL FIXATION (ORIF) RIGHT DISTAL RADIUS FRACTURE;  Surgeon: Dairl PonderMatthew Weingold, MD;  Location: [facility];  Service: Orthopedics;  Laterality: Right;  . OPEN REDUCTION INTERNAL FIXATION (ORIF) HAND Right thumb  . OPEN REDUCTION INTERNAL FIXATION (ORIF) METACARPAL Left 05/16/2015   Procedure: OPEN REDUCTION INTERNAL FIXATION OF MIDDLE PHALANGEAL FRACTURE, EXTENSOR TENDON REPAIR WITH ULNAR DIGITAL NERVE REPAIR MICROSCOPICALLY;  Surgeon: Dairl PonderMatthew Weingold, MD;  Location: [facility];  Service: Orthopedics;  Laterality: Left;  . THUMB FUSION Right    Social History   Social History  . Marital status: Divorced    Spouse name: N/A  . Number of children: N/A  . Years of education: N/A   Social History Main Topics  . Smoking status: Current Every Day Smoker    Packs/day: 1.00    Years: 44.00    Types: Cigarettes  . Smokeless tobacco: Former NeurosurgeonUser    Types: Chew  . Alcohol use 4.8 oz/week    8 Cans  of beer per week     Comment: hx heavy etoh august 2016  . Drug use:     Types: Marijuana, Cocaine, Other-see comments     Comment: none since august 2016  . Sexual activity: Yes    Birth control/ protection: None   Other Topics Concern  . None   Social History Narrative  . None   Family History  Problem Relation Age of Onset  . Diabetes Mother   . Hypertension Mother   . Obesity Mother   . Heart disease Mother   . Kidney disease Mother   . COPD Mother   . Cancer Father    Allergies  Allergen Reactions  . Penicillins Other (See Comments)    Childhood Allergy.  Has patient had a PCN reaction causing immediate rash, facial/tongue/throat swelling, SOB or lightheadedness with hypotension: unknown Has patient had a PCN reaction causing severe rash involving mucus membranes or skin necrosis: unknown Has patient had a PCN reaction that required hospitalization unknown Has patient had a PCN reaction occurring within the last 10 years: unknown If all of the above answers are "NO", then may proceed with Cephalosporin use.    Prior to Admission medications   Medication Sig Start Date End Date Taking? Authorizing Provider  albuterol (PROVENTIL HFA;VENTOLIN HFA) 108 (90 BASE) MCG/ACT inhaler Inhale 2 puffs into the lungs every 6 (six) hours as needed for wheezing or shortness  of breath.   Yes Historical Provider, MD  atorvastatin (LIPITOR) 20 MG tablet Take 1 tablet (20 mg total) by mouth daily. 02/10/16  Yes Shannon McElroy, PA-C  Brexpiprazole (REXULTI) 1 MG TABS Take 1 mg by mouth daily.   Yes Historical Provider, MD  busPIRone (BUSPAR) 5 MG tablet Take 5 mg by mouth 3 (three) times daily.   Yes Historical Provider, MD  DULoxetine (CYMBALTA) 60 MG capsule Take 60 mg by mouth daily.   Yes Historical Provider, MD  ibuprofen (ADVIL,MOTRIN) 600 MG tablet Take 1 tablet (600 mg total) by mouth every 6 (six) hours as needed. 05/12/16  Yes Ivery QualeHobson Bryant, PA-C  Multiple Vitamin (MULTIVITAMIN  WITH MINERALS) TABS tablet Take 1 tablet by mouth daily.   Yes Historical Provider, MD  oxyCODONE-acetaminophen (PERCOCET/ROXICET) 5-325 MG tablet Take 1 tablet by mouth every 6 (six) hours as needed. 06/02/16  Yes Vickki HearingStanley E Harrison, MD  traZODone (DESYREL) 100 MG tablet Take 100 mg by mouth at bedtime.   Yes Historical Provider, MD     Positive ROS: All other systems have been reviewed and were otherwise negative with the exception of those mentioned in the HPI and as above.  Physical Exam: General: Alert, no acute distress Cardiovascular: No pedal edema Respiratory: No cyanosis, no use of accessory musculature GI: abdomen soft Skin: No lesions in the area of chief complaint Neurologic: Sensation intact distally Psychiatric: Patient is competent for consent with normal mood and affect Lymphatic: no lymphedema  MUSCULOSKELETAL: exam stable  Assessment: left shoulder rotator cuff tear  Plan: Plan for Procedure(s): LEFT SHOULDER ARTHROSCOPY WITH ROTATOR CUFF REPAIR, SUBACROMIAL DECOMPRESSION, DISTAL CLAVICLE EXCISION  The risks benefits and alternatives were discussed with the patient including but not limited to the risks of nonoperative treatment, versus surgical intervention including infection, bleeding, nerve injury,  blood clots, cardiopulmonary complications, morbidity, mortality, among others, and they were willing to proceed.   Cheral AlmasXu, Naiping Michael, MD   06/08/2016 9:06 AM

## 2016-06-08 NOTE — Op Note (Signed)
   Date of Surgery: 02/24/2016  INDICATIONS: The patient is a 50 y.o.-year-old male with left shoulder pain, rotator cuff tear, impingement syndrome that has failed conservative treatment;  The patient did consent to the procedure after discussion of the risks and benefits.  PREOPERATIVE DIAGNOSIS: 1. Left shoulder rotator cuff tear, supraspinatus and infraspinatus 2. Left shoulder impingement syndrome 3. Left shoulder degenerative tear of biceps, SLAP, labrum 4. Left shoulder severe synovitis  POSTOPERATIVE DIAGNOSIS: Same.  PROCEDURE: 1. Arthroscopic left shoulder rotator cuff repair 2. Arthroscopic distal clavicle excision 3. Arthroscopic extensive debridement of labrum, SLAP, synovitis, rotator cuff 4. Arthroscopic subacromial decompression with acromioplasty  SURGEON: N. Glee ArvinMichael Noele Icenhour, M.D.  ASSIST: none.  ANESTHESIA:  general, regional  IV FLUIDS AND URINE: See anesthesia.  ESTIMATED BLOOD LOSS: minimal mL.  IMPLANTS: arthrex 5.5 mm biocomposite swivel locks x 2  COMPLICATIONS: None.  DESCRIPTION OF PROCEDURE: The patient was brought to the operating room and placed supine on the operating table.  The patient had been signed prior to the procedure and this was documented. The patient had the anesthesia placed by the anesthesiologist.  A time-out was performed to confirm that this was the correct patient, site, side and location. The patient did receive antibiotics prior to the incision and was re-dosed during the procedure as needed at indicated intervals.  The patient was then moved into the lateral position with the operative extremity suspended in the fishing pole mechanism. The patient had the operative extremity prepped and draped in the standard surgical fashion.    The standard posterior and anterior shoulder arthroscopy portals were established. We first visualized shoulder joint. He had extensive degenerative changes of the biceps anchor, superior labrum, anterior labrum  and extensive synovitis throughout his shoulder joint. He also had a large full-thickness rotator cuff tear that could be visualized from the joint. This was all thoroughly debrided using an oscillating shaver back to a stable border. He had evidence of early chondromalacia of the glenohumeral joint. This was also debrided using oscillating shaver. We then moved into the subacromial space. Extensive subacromial bursa was debrided using the oscillating shaver to gain visualization. An acromioplasty was then performed using a high-speed bur. I then identified the acromioclavicular joint. The distal clavicle excision was performed using a high-speed bur and oscillating shaver. We then performed additional debridement of the rotator cuff in order to identify the healthy tissue versus the scarred tissue. Once we had a border we measured the rotator cuff tear to be approximately 3 cm in width with about a centimeter and a half of retraction. We then used fiber tape in order to perform a horizontal mattress single row repair using 5.5 mm swivel lock anchors. Of note the bone quality was poor and was soft. The anterior anchor had decent purchase but was not excellent. The posterior anchor had excellent purchase into the bone. The joint and the subacromial space was then fully lavaged. The portals were closed with 3-0 nylon. Sterile dressings were applied. Shoulder was placed in a shoulder immobilizer. Patient tolerated the procedure well and no immediate competitions.  POSTOPERATIVE PLAN: Patient will be nonweightbearing to the left upper extremity.  Mayra ReelN. Michael Joanne Brander, MD [facility] [phone] 10:16 AM

## 2016-06-08 NOTE — Transfer of Care (Signed)
Immediate Anesthesia Transfer of Care Note  Patient: Jacob Reilly  Procedure(s) Performed: Procedure(s): LEFT SHOULDER ARTHROSCOPY WITH ROTATOR CUFF REPAIR, SUBACROMIAL DECOMPRESSION, DISTAL CLAVICLE EXCISION (Left)  Patient Location: PACU  Anesthesia Type:GA combined with regional for post-op pain  Level of Consciousness: awake, sedated and responds to stimulation  Airway & Oxygen Therapy: Patient Spontanous Breathing and Patient connected to face mask oxygen  Post-op Assessment: Report given to RN, Post -op Vital signs reviewed and stable and Patient moving all extremities  Post vital signs: Reviewed and stable  Last Vitals:  Vitals:   06/08/16 1000 06/08/16 1015  BP: 118/81 118/81  Pulse: 88 (!) 103  Resp: 11 17  Temp:      Last Pain:  Vitals:   06/08/16 0920  TempSrc: Oral  PainSc: 3       Patients Stated Pain Goal: 2 (06/07/16 1104)  Complications: No apparent anesthesia complications

## 2016-06-08 NOTE — Anesthesia Procedure Notes (Signed)
Procedure Name: Intubation Date/Time: 06/08/2016 10:43 AM Performed by: Baxter Flattery Pre-anesthesia Checklist: Patient identified, Emergency Drugs available, Suction available and Patient being monitored Patient Re-evaluated:Patient Re-evaluated prior to inductionOxygen Delivery Method: Circle system utilized Preoxygenation: Pre-oxygenation with 100% oxygen Intubation Type: IV induction Ventilation: Mask ventilation without difficulty Laryngoscope Size: Miller and 2 Grade View: Grade I Tube type: Oral Tube size: 8.0 mm Number of attempts: 1 Airway Equipment and Method: Stylet,  Bite block and LTA kit utilized Placement Confirmation: ETT inserted through vocal cords under direct vision,  positive ETCO2 and breath sounds checked- equal and bilateral Secured at: 22 cm Tube secured with: Tape Dental Injury: Teeth and Oropharynx as per pre-operative assessment

## 2016-06-08 NOTE — Discharge Instructions (Signed)
Postoperative instructions:  Weightbearing: non weight bearing  Keep your dressing and/or splint clean and dry at all times.  You can remove your dressing on post-operative day #3 and change with a dry/sterile dressing or Band-Aids as needed thereafter.    Incision instructions:  Do not soak your incision for 3 weeks after surgery.  If the incision gets wet, pat dry and do not scrub the incision.  Pain control:  You have been given a prescription to be taken as directed for post-operative pain control.  In addition, elevate the operative extremity above the heart at all times to prevent swelling and throbbing pain.  Take over-the-counter Colace, 100mg  by mouth twice a day while taking narcotic pain medications to help prevent constipation.  Follow up appointments: 1) 10-14 days for suture removal and wound check. 2) Dr. Roda ShuttersXu as scheduled.   -------------------------------------------------------------------------------------------------------------  After Surgery Pain Control:  After your surgery, post-surgical discomfort or pain is likely. This discomfort can last several days to a few weeks. At certain times of the day your discomfort may be more intense.  Did you receive a nerve block?  A nerve block can provide pain relief for one hour to two days after your surgery. As long as the nerve block is working, you will experience little or no sensation in the area the surgeon operated on.  As the nerve block wears off, you will begin to experience pain or discomfort. It is very important that you begin taking your prescribed pain medication before the nerve block fully wears off. Treating your pain at the first sign of the block wearing off will ensure your pain is better controlled and more tolerable when full-sensation returns. Do not wait until the pain is intolerable, as the medicine will be less effective. It is better to treat pain in advance than to try and catch up.  General Anesthesia:    If you did not receive a nerve block during your surgery, you will need to start taking your pain medication shortly after your surgery and should continue to do so as prescribed by your surgeon.  Pain Medication:  Most commonly we prescribe Vicodin and Percocet for post-operative pain. Both of these medications contain a combination of acetaminophen (Tylenol) and a narcotic to help control pain.   It takes between 30 and 45 minutes before pain medication starts to work. It is important to take your medication before your pain level gets too intense.   Nausea is a common side effect of many pain medications. You will want to eat something before taking your pain medicine to help prevent nausea.   If you are taking a prescription pain medication that contains acetaminophen, we recommend that you do not take additional over the counter acetaminophen (Tylenol).  Other pain relieving options:   Using a cold pack to ice the affected area a few times a day (15 to 20 minutes at a time) can help to relieve pain, reduce swelling and bruising.   Elevation of the affected area can also help to reduce pain and swelling.      Post Anesthesia Home Care Instructions  Activity: Get plenty of rest for the remainder of the day. A responsible adult should stay with you for 24 hours following the procedure.  For the next 24 hours, DO NOT: -Drive a car -Advertising copywriterperate machinery -Drink alcoholic beverages -Take any medication unless instructed by your physician -Make any legal decisions or sign important papers.  Meals: Start with liquid foods such as  gelatin or soup. Progress to regular foods as tolerated. Avoid greasy, spicy, heavy foods. If nausea and/or vomiting occur, drink only clear liquids until the nausea and/or vomiting subsides. Call your physician if vomiting continues.  Special Instructions/Symptoms: Your throat may feel dry or sore from the anesthesia or the breathing tube placed in your  throat during surgery. If this causes discomfort, gargle with warm salt water. The discomfort should disappear within 24 hours.  If you had a scopolamine patch placed behind your ear for the management of post- operative nausea and/or vomiting:  1. The medication in the patch is effective for 72 hours, after which it should be removed.  Wrap patch in a tissue and discard in the trash. Wash hands thoroughly with soap and water. 2. You may remove the patch earlier than 72 hours if you experience unpleasant side effects which may include dry mouth, dizziness or visual disturbances. 3. Avoid touching the patch. Wash your hands with soap and water after contact with the patch.   Regional Anesthesia Blocks  1. Numbness or the inability to move the "blocked" extremity may last from 3-48 hours after placement. The length of time depends on the medication injected and your individual response to the medication. If the numbness is not going away after 48 hours, call your surgeon.  2. The extremity that is blocked will need to be protected until the numbness is gone and the  Strength has returned. Because you cannot feel it, you will need to take extra care to avoid injury. Because it may be weak, you may have difficulty moving it or using it. You may not know what position it is in without looking at it while the block is in effect.  3. For blocks in the legs and feet, returning to weight bearing and walking needs to be done carefully. You will need to wait until the numbness is entirely gone and the strength has returned. You should be able to move your leg and foot normally before you try and bear weight or walk. You will need someone to be with you when you first try to ensure you do not fall and possibly risk injury.  4. Bruising and tenderness at the needle site are common side effects and will resolve in a few days.  5. Persistent numbness or new problems with movement should be communicated to the  surgeon or the [facility] [phone])/ [facility] [place] [phone]).

## 2016-06-08 NOTE — Anesthesia Procedure Notes (Signed)
Anesthesia Regional Block:  Supraclavicular block  Pre-Anesthetic Checklist: ,, timeout performed, Correct Patient, Correct Site, Correct Laterality, Correct Procedure, Correct Position, site marked, Risks and benefits discussed,  Surgical consent,  Pre-op evaluation,  At surgeon's request and post-op pain management  Laterality: Left and Upper  Prep: Maximum Sterile Barrier Precautions used, chloraprep       Needles:  Injection technique: Single-shot  Needle Type: Echogenic Stimulator Needle     Needle Length: 10cm 10 cm Needle Gauge: 21 G    Additional Needles:  Procedures: ultrasound guided (picture in chart) Supraclavicular block Narrative:  Injection made incrementally with aspirations every 5 mL.  Performed by: Personally   Additional Notes: Risks, benefits and alternative to block explained extensively.  Patient tolerated procedure well, without complications.      

## 2016-06-09 ENCOUNTER — Encounter (HOSPITAL_BASED_OUTPATIENT_CLINIC_OR_DEPARTMENT_OTHER): Payer: Self-pay | Admitting: Orthopaedic Surgery

## 2016-06-16 ENCOUNTER — Ambulatory Visit (INDEPENDENT_AMBULATORY_CARE_PROVIDER_SITE_OTHER): Payer: Self-pay | Admitting: Orthopedic Surgery

## 2016-06-16 ENCOUNTER — Encounter: Payer: Self-pay | Admitting: Orthopedic Surgery

## 2016-06-16 ENCOUNTER — Ambulatory Visit (INDEPENDENT_AMBULATORY_CARE_PROVIDER_SITE_OTHER): Payer: Self-pay

## 2016-06-16 VITALS — BP 118/73 | HR 71 | Ht 66.0 in | Wt 178.0 lb

## 2016-06-16 DIAGNOSIS — S42001D Fracture of unspecified part of right clavicle, subsequent encounter for fracture with routine healing: Secondary | ICD-10-CM

## 2016-06-16 DIAGNOSIS — S42101D Fracture of unspecified part of scapula, right shoulder, subsequent encounter for fracture with routine healing: Secondary | ICD-10-CM

## 2016-06-16 MED ORDER — OXYCODONE-ACETAMINOPHEN 5-325 MG PO TABS: 1.0000 | ORAL_TABLET | ORAL | 0 refills | Status: DC | PRN

## 2016-06-16 NOTE — Patient Instructions (Signed)
Perform exercises 3 times a day as stated

## 2016-06-16 NOTE — Progress Notes (Signed)
Fracture care follow-up  Encounter Diagnoses  Name Primary?  . Right scapula fracture, with routine healing, subsequent encounter Yes  . Clavicle fracture, right, with routine healing, subsequent encounter     BP 118/73   Pulse 71   Ht 5\' 6"  (1.676 m)   Wt 178 lb (80.7 kg)   BMI 28.73 kg/m   The patient recently had left rotator cuff surgery ran out of his pain medicine  I'm treating him for scapular fracture ask clavicle fracture  He says they hurt more  His x-ray today shows a nondisplaced or minimally displaced right clavicle fracture and a glenoid neck fracture which are stable and x-ray compared to the film taken August 16  Recommend 4 weeks x-ray  Patient can start pendulums and wall walking  I refilled his Percocet 5 mg

## 2016-06-30 ENCOUNTER — Telehealth: Payer: Self-pay | Admitting: Orthopedic Surgery

## 2016-06-30 ENCOUNTER — Other Ambulatory Visit: Payer: Self-pay | Admitting: *Deleted

## 2016-06-30 MED ORDER — OXYCODONE-ACETAMINOPHEN 5-325 MG PO TABS: 1.0000 | ORAL_TABLET | ORAL | 0 refills | Status: DC | PRN

## 2016-06-30 NOTE — Telephone Encounter (Signed)
Patient called to request refill:  oxyCODONE-acetaminophen (PERCOCET) 5-325 MG tablet 84 tablet   (no insurance)

## 2016-06-30 NOTE — Telephone Encounter (Signed)
Routing to Dr Harrison for approval 

## 2016-06-30 NOTE — Telephone Encounter (Signed)
Refill enough to last till next appointment

## 2016-07-14 ENCOUNTER — Telehealth: Payer: Self-pay | Admitting: Orthopedic Surgery

## 2016-07-14 NOTE — Telephone Encounter (Signed)
Patient called and canceled his appointment for tomorrow.  He said that he was waiting on [facility] [employer] to get back to him.

## 2016-07-15 ENCOUNTER — Ambulatory Visit: Payer: Self-pay | Admitting: Orthopedic Surgery

## 2016-07-18 ENCOUNTER — Ambulatory Visit (INDEPENDENT_AMBULATORY_CARE_PROVIDER_SITE_OTHER): Payer: Self-pay | Admitting: Specialist

## 2016-08-09 ENCOUNTER — Other Ambulatory Visit: Payer: Self-pay

## 2016-08-09 DIAGNOSIS — E785 Hyperlipidemia, unspecified: Secondary | ICD-10-CM

## 2016-08-13 LAB — COMPLETE METABOLIC PANEL WITH GFR
ALT: 13 U/L (ref 9–46)
AST: 15 U/L (ref 10–35)
Albumin: 4.2 g/dL (ref 3.6–5.1)
Alkaline Phosphatase: 126 U/L — ABNORMAL HIGH (ref 40–115)
BUN: 13 mg/dL (ref 7–25)
CALCIUM: 9.7 mg/dL (ref 8.6–10.3)
CHLORIDE: 107 mmol/L (ref 98–110)
CO2: 26 mmol/L (ref 20–31)
CREATININE: 0.88 mg/dL (ref 0.70–1.33)
GFR, Est Non African American: >89 mL/min (ref >=60)
Glucose, Bld: 88 mg/dL (ref 65–99)
POTASSIUM: 4.9 mmol/L (ref 3.5–5.3)
Sodium: 140 mmol/L (ref 135–146)
Total Bilirubin: 0.7 mg/dL (ref 0.2–1.2)
Total Protein: 6.9 g/dL (ref 6.1–8.1)

## 2016-08-13 LAB — LIPID PANEL
CHOL/HDL RATIO: 4.8 ratio (ref <=5.0)
CHOLESTEROL: 202 mg/dL — AB (ref 125–200)
HDL: 42 mg/dL (ref >=40)
LDL Cholesterol: 145 mg/dL — ABNORMAL HIGH (ref <130)
TRIGLYCERIDES: 77 mg/dL (ref <150)
VLDL: 15 mg/dL (ref <30)

## 2016-08-17 ENCOUNTER — Encounter: Payer: Self-pay | Admitting: Physician Assistant

## 2016-08-17 ENCOUNTER — Ambulatory Visit: Payer: Self-pay | Admitting: Physician Assistant

## 2016-08-17 VITALS — BP 102/68 | HR 80 | Temp 98.2°F | Ht 66.0 in | Wt 178.8 lb

## 2016-08-17 DIAGNOSIS — E785 Hyperlipidemia, unspecified: Secondary | ICD-10-CM

## 2016-08-17 DIAGNOSIS — F17219 Nicotine dependence, cigarettes, with unspecified nicotine-induced disorders: Secondary | ICD-10-CM

## 2016-08-17 DIAGNOSIS — G8929 Other chronic pain: Secondary | ICD-10-CM

## 2016-08-17 DIAGNOSIS — M8580 Other specified disorders of bone density and structure, unspecified site: Secondary | ICD-10-CM

## 2016-08-17 DIAGNOSIS — F419 Anxiety disorder, unspecified: Secondary | ICD-10-CM

## 2016-08-17 MED ORDER — ATORVASTATIN CALCIUM 20 MG PO TABS: 20.0000 mg | ORAL_TABLET | Freq: Every day | ORAL | 1 refills | Status: DC

## 2016-08-17 NOTE — Progress Notes (Signed)
BP 102/68 (BP Location: Left Arm, Patient Position: Sitting, Cuff Size: Normal)   Pulse 80   Temp 98.2 F (36.8 C)   Ht 5\' 6"  (1.676 m)   Wt 178 lb 12 oz (81.1 kg)   SpO2 96%   BMI 28.85 kg/m    Subjective:    Patient ID: Jacob Reilly, male    DOB: 05/31/1966, 50 y.o.   MRN: [id number]  HPI: Jacob Reilly is a 50 y.o. male presenting on 08/17/2016 for Hyperlipidemia   HPI   Pt hasn't been to [facility] lately. States too busy due to ortho stuff.  Says he "freaking out inside" . He thinks one of the meds was making him "wacky" but he doesn't know which one.  Pt says he is still working with ortho- says his [facility] discount expired and he has turned in new application but hasn't gotten approval letter yet.   Pt says he isn't really sure what the ortho told him last time he was there.  Says he has osteopenia and says he got results of myelogram, but says that most of what got said went over his head. Says he was so overwhelmed he doesn't really know much about what the orthopedist wants to do.  Pt stopped his lipitor after last OV b/c his cholesterol was good  Relevant past medical, surgical, family and social history reviewed and updated as indicated. Interim medical history since our last visit reviewed. Allergies and medications reviewed and updated.   Current Outpatient Prescriptions:  .  albuterol (PROVENTIL HFA;VENTOLIN HFA) 108 (90 BASE) MCG/ACT inhaler, Inhale 2 puffs into the lungs every 6 (six) hours as needed for wheezing or shortness of breath., Disp: , Rfl:  .  busPIRone (BUSPAR) 5 MG tablet, Take 5 mg by mouth 3 (three) times daily., Disp: , Rfl:  .  ibuprofen (ADVIL,MOTRIN) 600 MG tablet, Take 1 tablet (600 mg total) by mouth every 6 (six) hours as needed., Disp: 30 tablet, Rfl: 0 .  atorvastatin (LIPITOR) 20 MG tablet, Take 1 tablet (20 mg total) by mouth daily. (Patient not taking: Reported on 08/17/2016), Disp: 90 tablet, Rfl: 1 .  Brexpiprazole (REXULTI) 1 MG TABS, Take 1  mg by mouth daily., Disp: , Rfl:  .  DULoxetine (CYMBALTA) 60 MG capsule, Take 60 mg by mouth daily., Disp: , Rfl:  .  methocarbamol (ROBAXIN) 750 MG tablet, Take 1 tablet (750 mg total) by mouth 2 (two) times daily as needed for muscle spasms. (Patient not taking: Reported on 08/17/2016), Disp: 60 tablet, Rfl: 0 .  Multiple Vitamin (MULTIVITAMIN WITH MINERALS) TABS tablet, Take 1 tablet by mouth daily., Disp: , Rfl:  .  ondansetron (ZOFRAN) 4 MG tablet, Take 1-2 tablets (4-8 mg total) by mouth every 8 (eight) hours as needed for nausea or vomiting. (Patient not taking: Reported on 08/17/2016), Disp: 40 tablet, Rfl: 0 .  oxyCODONE (OXYCONTIN) 10 mg 12 hr tablet, Take 1 tablet (10 mg total) by mouth every 12 (twelve) hours. (Patient not taking: Reported on 08/17/2016), Disp: 10 tablet, Rfl: 0 .  oxyCODONE-acetaminophen (PERCOCET) 5-325 MG tablet, Take 1 tablet by mouth every 4 (four) hours as needed for severe pain. (Patient not taking: Reported on 08/17/2016), Disp: 84 tablet, Rfl: 0 .  senna-docusate (SENOKOT S) 8.6-50 MG tablet, Take 1 tablet by mouth at bedtime as needed. (Patient not taking: Reported on 08/17/2016), Disp: 30 tablet, Rfl: 1 .  traZODone (DESYREL) 100 MG tablet, Take 100 mg by mouth at bedtime., Disp: , Rfl:  Review of Systems  Constitutional: Negative for appetite change, chills, diaphoresis, fatigue, fever and unexpected weight change.  HENT: Negative for congestion, dental problem, drooling, ear pain, facial swelling, hearing loss, mouth sores, sneezing, sore throat, trouble swallowing and voice change.   Eyes: Negative for pain, discharge, redness, itching and visual disturbance.  Respiratory: Negative for cough, choking, shortness of breath and wheezing.   Cardiovascular: Negative for chest pain, palpitations and leg swelling.  Gastrointestinal: Negative for abdominal pain, blood in stool, constipation, diarrhea and vomiting.  Endocrine: Positive for cold intolerance. Negative  for heat intolerance and polydipsia.  Genitourinary: Negative for decreased urine volume, dysuria and hematuria.  Musculoskeletal: Positive for arthralgias and back pain. Negative for gait problem.  Skin: Negative for rash.  Allergic/Immunologic: Positive for environmental allergies.  Neurological: Negative for seizures, syncope, light-headedness and headaches.  Hematological: Negative for adenopathy.  Psychiatric/Behavioral: Positive for dysphoric mood. Negative for agitation and suicidal ideas. The patient is nervous/anxious.     Per HPI unless specifically indicated above     Objective:    BP 102/68 (BP Location: Left Arm, Patient Position: Sitting, Cuff Size: Normal)   Pulse 80   Temp 98.2 F (36.8 C)   Ht 5\' 6"  (1.676 m)   Wt 178 lb 12 oz (81.1 kg)   SpO2 96%   BMI 28.85 kg/m   Wt Readings from Last 3 Encounters:  08/17/16 178 lb 12 oz (81.1 kg)  06/16/16 178 lb (80.7 kg)  06/08/16 176 lb (79.8 kg)    Physical Exam  Constitutional: He is oriented to person, place, and time. He appears well-developed and well-nourished.  HENT:  Head: Normocephalic and atraumatic.  Neck: Neck supple.  Cardiovascular: Normal rate and regular rhythm.   Pulmonary/Chest: Effort normal and breath sounds normal. He has no wheezes.  Abdominal: Soft. Bowel sounds are normal. There is no hepatosplenomegaly. There is no tenderness.  Musculoskeletal: He exhibits no edema.  Lymphadenopathy:    He has no cervical adenopathy.  Neurological: He is alert and oriented to person, place, and time.  Skin: Skin is warm and dry.  Psychiatric: He has a normal mood and affect. His behavior is normal.  Vitals reviewed.   Results for orders placed or performed in visit on 08/09/16  COMPLETE METABOLIC PANEL WITH GFR  Result Value Ref Range   Sodium 140 135 - 146 mmol/L   Potassium 4.9 3.5 - 5.3 mmol/L   Chloride 107 98 - 110 mmol/L   CO2 26 20 - 31 mmol/L   Glucose, Bld 88 65 - 99 mg/dL   BUN 13 7 - 25  mg/dL   Creat 1.610.88 0.960.70 - 0.451.33 mg/dL   Total Bilirubin 0.7 0.2 - 1.2 mg/dL   Alkaline Phosphatase 126 (H) 40 - 115 U/L   AST 15 10 - 35 U/L   ALT 13 9 - 46 U/L   Total Protein 6.9 6.1 - 8.1 g/dL   Albumin 4.2 3.6 - 5.1 g/dL   Calcium 9.7 8.6 - 40.910.3 mg/dL   GFR, Est African American >89 >=60 mL/min   GFR, Est Non African American >89 >=60 mL/min  Lipid Profile  Result Value Ref Range   Cholesterol 202 (H) 125 - 200 mg/dL   Triglycerides 77 <811<150 mg/dL   HDL 42 >=91>=40 mg/dL   Total CHOL/HDL Ratio 4.8 <=5.0 Ratio   VLDL 15 <30 mg/dL   LDL Cholesterol 478145 (H) <130 mg/dL      Assessment & Plan:   Encounter Diagnoses  Name  Primary?  . Hyperlipidemia, unspecified hyperlipidemia type Yes  . Cigarette nicotine dependence with nicotine-induced disorder   . Osteopenia, unspecified location   . Other chronic pain   . Anxiety disorder, unspecified type     -Reviewed labs with pt -counseled pt to Restart lipitor -urged pt to get back in with [facility] to get his meds straight and get some counseling  -recommended pt start calcium with vit d.  Told him to get orthopedics recommendations for his osteopenia.   -counseled pt on smoking cessation and discussed relationship between smoking an bone density -follow up 3 months.  RTO sooner prn (The duration of this appointment visit was 25 minutes of face-to-face time with the patient.  Greater than 50% of this time was spent in counseling, explanation of diagnosis, planning of further management, and coordination of care.)

## 2016-08-17 NOTE — Patient Instructions (Addendum)
Restart atorvastain  Start over the counter CALCIUM with Vit D  [facility]

## 2016-08-22 ENCOUNTER — Ambulatory Visit (INDEPENDENT_AMBULATORY_CARE_PROVIDER_SITE_OTHER): Payer: Self-pay | Admitting: Orthopaedic Surgery

## 2016-08-26 ENCOUNTER — Ambulatory Visit (INDEPENDENT_AMBULATORY_CARE_PROVIDER_SITE_OTHER): Payer: Self-pay | Admitting: Specialist

## 2016-09-01 ENCOUNTER — Ambulatory Visit (INDEPENDENT_AMBULATORY_CARE_PROVIDER_SITE_OTHER): Payer: Self-pay | Admitting: Specialist

## 2016-09-15 NOTE — Progress Notes (Signed)
This encounter was created in error - please disregard.

## 2016-09-23 ENCOUNTER — Ambulatory Visit (INDEPENDENT_AMBULATORY_CARE_PROVIDER_SITE_OTHER): Payer: Self-pay | Admitting: Orthopaedic Surgery

## 2016-10-07 ENCOUNTER — Encounter (INDEPENDENT_AMBULATORY_CARE_PROVIDER_SITE_OTHER): Payer: Self-pay | Admitting: Physical Medicine and Rehabilitation

## 2016-10-07 ENCOUNTER — Ambulatory Visit (INDEPENDENT_AMBULATORY_CARE_PROVIDER_SITE_OTHER): Payer: Self-pay | Admitting: Physical Medicine and Rehabilitation

## 2016-10-07 DIAGNOSIS — R202 Paresthesia of skin: Secondary | ICD-10-CM

## 2016-10-07 NOTE — Progress Notes (Signed)
Jacob Reilly - 50 y.o. male MRN [id number]  Date of birth: 01/04/1966  Office Visit Note: Visit Date: 10/07/2016 PCP: Jacob HawkingShannon McElroy, PA-C Referred by: Jacob HawkingMcElroy, Shannon, PA-C  Subjective: Chief Complaint  Patient presents with  . Right Leg - Pain  . Left Leg - Pain   HPI: Mr. Jacob Reilly is a 50 year old gentleman with complaints of chronic worsening shooting, sharp pains in both legs. He reports no specific dermatomal or peripheral nerve distribution and states that it changes locations.. Sometimes top of foot hurts. He reports lumbar fusion by Dr. Danielle Reilly in 1994 and that his symptoms have gotten progressively worse since then. Some numbness and tingling are present at times. He actually saw Jacob Reilly in I believe September of this year and electrodiagnostic studies were requested but due to financial reasons he was unable to get in until now. He has not had prior electrodiagnostic studies. His case is complicated by polysubstance abuse as well as alcohol abuse as well as anxiety depression and PTSD.     ROS Otherwise per HPI.  Assessment & Plan: Visit Diagnoses:  1. Paresthesia of skin     Plan: Findings:  Impression: The above electrodiagnostic study is ABNORMAL and reveals evidence of mild chronic L5 radiculopathy on the right and left.  These were mild findings and there is no active radiculopathy present.   There is no significant electrodiagnostic evidence of any other focal nerve entrapment, lumbosacral plexopathy or generalized peripheral neuropathy in either lower limb. *There were borderline changes in nerve conductions mainly of the sensory nerves and this is likely temperature effect. Despite using the heater we could not maintain the leg above 30.  Recommendations:  1. Follow-up with referring physician and continue current treatment of pain symptoms.     Meds & Orders: No orders of the defined types were placed in this encounter.   Orders Placed This Encounter    Procedures  . NCV with EMG (electromyography)    Follow-up: Return for Scheduled follow-up with Jacob Reilly.   Procedures: No procedures performed  EMG & NCV Findings: Evaluation of the left fibular motor nerve showed borderline prolonged distal onset latency (6.8 ms) and reduced amplitude (1.3 mV).  The left sural sensory and the right sural sensory nerves showed borderline prolonged distal peak latency (L4.1, R4.6 ms) and decreased conduction velocity (Calf-Lat Mall, L34, R30 m/s).  All remaining nerves (as indicated in the following tables) were within normal limits.  Left vs. Right side comparison data for the fibular motor nerve indicates abnormal L-R velocity difference (B Fib-Ankle, 12 m/s).  All remaining left vs. right side differences were within normal limits.    Needle evaluation of the left anterior tibialis and the right anterior tibialis muscles showed increased insertional activity, increased motor unit amplitude, and diminished recruitment.  The left Fibularis Longus and the right biceps femoris (short head) muscles showed increased insertional activity and increased motor unit amplitude.  The left biceps femoris (short head) muscle showed increased insertional activity, slightly increased spontaneous activity, and increased motor unit amplitude.  All remaining muscles (as indicated in the following table) showed no evidence of electrical instability.    Impression: The above electrodiagnostic study is ABNORMAL and reveals evidence of mild chronic L5 radiculopathy on the right and left.  These were mild findings and there is no active radiculopathy present.   There is no significant electrodiagnostic evidence of any other focal nerve entrapment, lumbosacral plexopathy or generalized peripheral neuropathy in either lower limb. *There were borderline  changes in nerve conductions mainly of the sensory nerves and this is likely temperature effect. Despite using the heater we could not  maintain the leg above 30.  Recommendations:  1. Follow-up with referring physician and continue current treatment of pain symptoms.     Nerve Conduction Studies Anti Sensory Summary Table   Stim Site NR Peak (ms) Norm Peak (ms) P-T Amp (V) Norm P-T Amp Site1 Site2 Delta-P (ms) Dist (cm) Vel (m/s) Norm Vel (m/s)  Left Saphenous Anti Sensory (Ant Med Mall)  27.4C  14cm    4.3 <4.4 12.3 >2 14cm Ant Med Mall 4.3 0.0  >32  Right Saphenous Anti Sensory (Ant Med Mall)  27.5C  14cm    4.2 <4.4 11.1 >2 14cm Ant Med Mall 4.2 0.0  >32  Left Sup Fibular Anti Sensory (Ant Lat Mall)  27.3C  14 cm    4.0 <4.4 15.7 >5.0 14 cm Ant Lat Mall 4.0 14.0 35 >32  Right Sup Fibular Anti Sensory (Ant Lat Mall)  27.5C  14 cm    4.1 <4.4 13.5 >5.0 14 cm Ant Lat Mall 4.1 14.0 34 >32  Left Sural Anti Sensory (Lat Mall)  27.7C  Calf    *4.1 <4.0 18.9 >5.0 Calf Lat Mall 4.1 14.0 *34 >35  Right Sural Anti Sensory (Lat Mall)  27.7C  Calf    *4.6 <4.0 18.2 >5.0 Calf Lat Mall 4.6 14.0 *30 >35   Motor Summary Table   Stim Site NR Onset (ms) Norm Onset (ms) O-P Amp (mV) Norm O-P Amp Site1 Site2 Delta-0 (ms) Dist (cm) Vel (m/s) Norm Vel (m/s)  Left Fibular Motor (Ext Dig Brev)  27.1C  Ankle    *6.8 <6.1 *1.3 >2.5 B Fib Ankle 7.0 35.0 50 >38  B Fib    13.8  0.8  Poplt B Fib 2.1 10.0 48 >40  Poplt    15.9  2.0         Right Fibular Motor (Ext Dig Brev)  27.2C  Ankle    5.5 <6.1 2.7 >2.5 B Fib Ankle 7.2 27.0 38 >38  B Fib    12.7  2.4  Poplt B Fib 2.4 10.0 42 >40  Poplt    15.1  3.4         Left Tibial Motor (Abd Hall Brev)  27.1C  Ankle    6.1 <6.1 5.1 >3.0 Knee Ankle 7.4 36.0 49 >35  Knee    13.5  6.8         Right Tibial Motor (Abd Hall Brev)  27.4C  Ankle    5.6 <6.1 6.2 >3.0 Knee Ankle 8.2 38.0 46 >35  Knee    13.8  6.7          EMG   Side Muscle Nerve Root Ins Act Fibs Psw Amp Dur Poly Recrt Int Dennie Bible Comment  Left AntTibialis Dp Br Peron L4-5 *Incr Nml Nml *Incr Nml 0 *Reduced Nml   Left  Fibularis Longus  Sup Br Peron L5-S1 *Incr Nml Nml *Incr Nml 0 Nml Nml   Left MedGastroc Tibial S1-2 Nml Nml Nml Nml Nml 0 Nml Nml   Left VastusMed Femoral L2-4 Nml Nml Nml Nml Nml 0 Nml Nml   Left BicepsFemS Sciatic L5-S1 *Incr *1+ *1+ *Incr Nml 0 Nml Nml   Right AntTibialis Dp Br Peron L4-5 *Incr Nml Nml *Incr Nml 0 *Reduced Nml   Right Fibularis Longus  Sup Br Peron L5-S1 Nml Nml Nml Nml Nml 0 Nml Nml  Right MedGastroc Tibial S1-2 Nml Nml Nml Nml Nml 0 Nml Nml   Right VastusMed Femoral L2-4 Nml Nml Nml Nml Nml 0 Nml Nml   Right BicepsFemS Sciatic L5-S1 *Incr Nml Nml *Incr Nml 0 Nml Nml     Nerve Conduction Studies Anti Sensory Left/Right Comparison   Stim Site L Lat (ms) R Lat (ms) L-R Lat (ms) L Amp (V) R Amp (V) L-R Amp (%) Site1 Site2 L Vel (m/s) R Vel (m/s) L-R Vel (m/s)  Saphenous Anti Sensory (Ant Med Mall)  27.4C  14cm 4.3 4.2 0.1 12.3 11.1 9.8 14cm Ant Med Mall     Sup Fibular Anti Sensory (Ant Lat Mall)  27.3C  14 cm 4.0 4.1 0.1 15.7 13.5 14.0 14 cm Ant Lat Mall 35 34 1  Sural Anti Sensory (Lat Mall)  27.7C  Calf *4.1 *4.6 0.5 18.9 18.2 3.7 Calf Lat Mall *34 *30 4   Motor Left/Right Comparison   Stim Site L Lat (ms) R Lat (ms) L-R Lat (ms) L Amp (mV) R Amp (mV) L-R Amp (%) Site1 Site2 L Vel (m/s) R Vel (m/s) L-R Vel (m/s)  Fibular Motor (Ext Dig Brev)  27.1C  Ankle *6.8 5.5 1.3 *1.3 2.7 51.9 B Fib Ankle 50 38 *12  B Fib 13.8 12.7 1.1 0.8 2.4 66.7 Poplt B Fib 48 42 6  Poplt 15.9 15.1 0.8 2.0 3.4 41.2       Tibial Motor (Abd Hall Brev)  27.1C  Ankle 6.1 5.6 0.5 5.1 6.2 17.7 Knee Ankle 49 46 3  Knee 13.5 13.8 0.3 6.8 6.7 1.5             Clinical History: No specialty comments available.  He reports that he has been smoking Cigarettes.  He has a 44.00 pack-year smoking history. He has quit using smokeless tobacco. His smokeless tobacco use included Chew.   Recent Labs  01/13/16 1330  HGBA1C 5.2    Objective:  VS:  HT:    WT:   BMI:     BP:   HR: bpm   TEMP: ( )  RESP:  Physical Exam  Musculoskeletal:  The patient has full 5 out of 5 strength in all muscle groups of the lower extremities bilaterally. His intact sensation bilaterally to light touch. There is no muscle atrophy in the lower extremities or foot intrinsic musculature. There is no edema or allodynia. Skin does show some multiple areas of small sores.    Ortho Exam Imaging: No results found.  Past Medical/Family/Surgical/Social History: Medications & Allergies reviewed per EMR Patient Active Problem List   Diagnosis Date Noted  . Hyperlipidemia 02/10/2016  . Low back pain 02/10/2016  . Cigarette nicotine dependence with nicotine-induced disorder 02/10/2016  . Chronic pain 01/15/2016  . Anxiety disorder 01/15/2016  . Polysubstance abuse 04/06/2015  . Substance induced mood disorder (HCC) 04/06/2015  . Homicidal ideation   . MRSA infection 12/24/2012  . Smoker 12/24/2012  . Habitual alcohol use 12/24/2012  . Finger osteomyelitis, right (HCC) 11/26/2012   Past Medical History:  Diagnosis Date  . Anxiety   . Asthma    as child-now with allergy season only.  . Depression   . ETOH abuse   . Hyperlipidemia   . Kidney calculi   . Low back pain   . Pneumonia   . Polysubstance abuse   . PTSD (post-traumatic stress disorder)    Family History  Problem Relation Age of Onset  . Diabetes Mother   . Hypertension  Mother   . Obesity Mother   . Heart disease Mother   . Kidney disease Mother   . COPD Mother   . Cancer Father    Past Surgical History:  Procedure Laterality Date  . BACK SURGERY    . HYDROCELE EXCISION Left 08/01/2013   Procedure: HYDROCELECTOMY ADULT;  Surgeon: Ky Barban, MD;  Location: [facility];  Service: Urology;  Laterality: Left;  . I&D EXTREMITY  10/24/2012   Procedure: IRRIGATION AND DEBRIDEMENT EXTREMITY;  Surgeon: Tami Ribas, MD;  Location: [facility];  Service: Orthopedics;  Laterality: Right;  . OPEN REDUCTION INTERNAL FIXATION (ORIF)  DISTAL RADIAL FRACTURE Right 05/16/2015   Procedure: OPEN REDUCTION INTERNAL FIXATION (ORIF) RIGHT DISTAL RADIUS FRACTURE;  Surgeon: Dairl Ponder, MD;  Location: [facility];  Service: Orthopedics;  Laterality: Right;  . OPEN REDUCTION INTERNAL FIXATION (ORIF) HAND Right thumb  . OPEN REDUCTION INTERNAL FIXATION (ORIF) METACARPAL Left 05/16/2015   Procedure: OPEN REDUCTION INTERNAL FIXATION OF MIDDLE PHALANGEAL FRACTURE, EXTENSOR TENDON REPAIR WITH ULNAR DIGITAL NERVE REPAIR MICROSCOPICALLY;  Surgeon: Dairl Ponder, MD;  Location: [facility];  Service: Orthopedics;  Laterality: Left;  . SHOULDER ARTHROSCOPY WITH ROTATOR CUFF REPAIR AND SUBACROMIAL DECOMPRESSION Left 06/08/2016   Procedure: LEFT SHOULDER ARTHROSCOPY WITH ROTATOR CUFF REPAIR, SUBACROMIAL DECOMPRESSION, DISTAL CLAVICLE EXCISION;  Surgeon: Tarry Kos, MD;  Location: [facility];  Service: Orthopedics;  Laterality: Left;  . THUMB FUSION Right    Social History   Occupational History  . Not on file.   Social History Main Topics  . Smoking status: Current Every Day Smoker    Packs/day: 1.00    Years: 44.00    Types: Cigarettes  . Smokeless tobacco: Former Neurosurgeon    Types: Chew  . Alcohol use 4.8 oz/week    8 Cans of beer per week     Comment: hx heavy etoh august 2016  . Drug use:     Types: Marijuana, Cocaine, Other-see comments     Comment: none since august 2016  . Sexual activity: Yes    Birth control/ protection: None

## 2016-10-12 NOTE — Procedures (Signed)
EMG & NCV Findings: Evaluation of the left fibular motor nerve showed borderline prolonged distal onset latency (6.8 ms) and reduced amplitude (1.3 mV).  The left sural sensory and the right sural sensory nerves showed borderline prolonged distal peak latency (L4.1, R4.6 ms) and decreased conduction velocity (Calf-Lat Mall, L34, R30 m/s).  All remaining nerves (as indicated in the following tables) were within normal limits.  Left vs. Right side comparison data for the fibular motor nerve indicates abnormal L-R velocity difference (B Fib-Ankle, 12 m/s).  All remaining left vs. right side differences were within normal limits.    Needle evaluation of the left anterior tibialis and the right anterior tibialis muscles showed increased insertional activity, increased motor unit amplitude, and diminished recruitment.  The left Fibularis Longus and the right biceps femoris (short head) muscles showed increased insertional activity and increased motor unit amplitude.  The left biceps femoris (short head) muscle showed increased insertional activity, slightly increased spontaneous activity, and increased motor unit amplitude.  All remaining muscles (as indicated in the following table) showed no evidence of electrical instability.    Impression: The above electrodiagnostic study is ABNORMAL and reveals evidence of mild chronic L5 radiculopathy on the right and left.  These were mild findings and there is no active radiculopathy present.   There is no significant electrodiagnostic evidence of any other focal nerve entrapment, lumbosacral plexopathy or generalized peripheral neuropathy in either lower limb. *There were borderline changes in nerve conductions mainly of the sensory nerves and this is likely temperature effect. Despite using the heater we could not maintain the leg above 30.  Recommendations:  1. Follow-up with referring physician and continue current treatment of pain symptoms.     Nerve  Conduction Studies Anti Sensory Summary Table   Stim Site NR Peak (ms) Norm Peak (ms) P-T Amp (V) Norm P-T Amp Site1 Site2 Delta-P (ms) Dist (cm) Vel (m/s) Norm Vel (m/s)  Left Saphenous Anti Sensory (Ant Med Mall)  27.4C  14cm    4.3 <4.4 12.3 >2 14cm Ant Med Mall 4.3 0.0  >32  Right Saphenous Anti Sensory (Ant Med Mall)  27.5C  14cm    4.2 <4.4 11.1 >2 14cm Ant Med Mall 4.2 0.0  >32  Left Sup Fibular Anti Sensory (Ant Lat Mall)  27.3C  14 cm    4.0 <4.4 15.7 >5.0 14 cm Ant Lat Mall 4.0 14.0 35 >32  Right Sup Fibular Anti Sensory (Ant Lat Mall)  27.5C  14 cm    4.1 <4.4 13.5 >5.0 14 cm Ant Lat Mall 4.1 14.0 34 >32  Left Sural Anti Sensory (Lat Mall)  27.7C  Calf    *4.1 <4.0 18.9 >5.0 Calf Lat Mall 4.1 14.0 *34 >35  Right Sural Anti Sensory (Lat Mall)  27.7C  Calf    *4.6 <4.0 18.2 >5.0 Calf Lat Mall 4.6 14.0 *30 >35   Motor Summary Table   Stim Site NR Onset (ms) Norm Onset (ms) O-P Amp (mV) Norm O-P Amp Site1 Site2 Delta-0 (ms) Dist (cm) Vel (m/s) Norm Vel (m/s)  Left Fibular Motor (Ext Dig Brev)  27.1C  Ankle    *6.8 <6.1 *1.3 >2.5 B Fib Ankle 7.0 35.0 50 >38  B Fib    13.8  0.8  Poplt B Fib 2.1 10.0 48 >40  Poplt    15.9  2.0         Right Fibular Motor (Ext Dig Brev)  27.2C  Ankle    5.5 <6.1 2.7 >2.5  B Fib Ankle 7.2 27.0 38 >38  B Fib    12.7  2.4  Poplt B Fib 2.4 10.0 42 >40  Poplt    15.1  3.4         Left Tibial Motor (Abd Hall Brev)  27.1C  Ankle    6.1 <6.1 5.1 >3.0 Knee Ankle 7.4 36.0 49 >35  Knee    13.5  6.8         Right Tibial Motor (Abd Hall Brev)  27.4C  Ankle    5.6 <6.1 6.2 >3.0 Knee Ankle 8.2 38.0 46 >35  Knee    13.8  6.7          EMG   Side Muscle Nerve Root Ins Act Fibs Psw Amp Dur Poly Recrt Int Dennie BiblePat Comment  Left AntTibialis Dp Br Peron L4-5 *Incr Nml Nml *Incr Nml 0 *Reduced Nml   Left Fibularis Longus  Sup Br Peron L5-S1 *Incr Nml Nml *Incr Nml 0 Nml Nml   Left MedGastroc Tibial S1-2 Nml Nml Nml Nml Nml 0 Nml Nml   Left VastusMed Femoral  L2-4 Nml Nml Nml Nml Nml 0 Nml Nml   Left BicepsFemS Sciatic L5-S1 *Incr *1+ *1+ *Incr Nml 0 Nml Nml   Right AntTibialis Dp Br Peron L4-5 *Incr Nml Nml *Incr Nml 0 *Reduced Nml   Right Fibularis Longus  Sup Br Peron L5-S1 Nml Nml Nml Nml Nml 0 Nml Nml   Right MedGastroc Tibial S1-2 Nml Nml Nml Nml Nml 0 Nml Nml   Right VastusMed Femoral L2-4 Nml Nml Nml Nml Nml 0 Nml Nml   Right BicepsFemS Sciatic L5-S1 *Incr Nml Nml *Incr Nml 0 Nml Nml     Nerve Conduction Studies Anti Sensory Left/Right Comparison   Stim Site L Lat (ms) R Lat (ms) L-R Lat (ms) L Amp (V) R Amp (V) L-R Amp (%) Site1 Site2 L Vel (m/s) R Vel (m/s) L-R Vel (m/s)  Saphenous Anti Sensory (Ant Med Mall)  27.4C  14cm 4.3 4.2 0.1 12.3 11.1 9.8 14cm Ant Med Mall     Sup Fibular Anti Sensory (Ant Lat Mall)  27.3C  14 cm 4.0 4.1 0.1 15.7 13.5 14.0 14 cm Ant Lat Mall 35 34 1  Sural Anti Sensory (Lat Mall)  27.7C  Calf *4.1 *4.6 0.5 18.9 18.2 3.7 Calf Lat Mall *34 *30 4   Motor Left/Right Comparison   Stim Site L Lat (ms) R Lat (ms) L-R Lat (ms) L Amp (mV) R Amp (mV) L-R Amp (%) Site1 Site2 L Vel (m/s) R Vel (m/s) L-R Vel (m/s)  Fibular Motor (Ext Dig Brev)  27.1C  Ankle *6.8 5.5 1.3 *1.3 2.7 51.9 B Fib Ankle 50 38 *12  B Fib 13.8 12.7 1.1 0.8 2.4 66.7 Poplt B Fib 48 42 6  Poplt 15.9 15.1 0.8 2.0 3.4 41.2       Tibial Motor (Abd Hall Brev)  27.1C  Ankle 6.1 5.6 0.5 5.1 6.2 17.7 Knee Ankle 49 46 3  Knee 13.5 13.8 0.3 6.8 6.7 1.5

## 2016-11-14 ENCOUNTER — Other Ambulatory Visit: Payer: Self-pay

## 2016-11-14 DIAGNOSIS — E785 Hyperlipidemia, unspecified: Secondary | ICD-10-CM

## 2016-11-17 ENCOUNTER — Ambulatory Visit: Payer: Self-pay | Admitting: Physician Assistant

## 2016-11-17 ENCOUNTER — Ambulatory Visit (INDEPENDENT_AMBULATORY_CARE_PROVIDER_SITE_OTHER): Payer: MEDICAID | Admitting: Specialist

## 2016-11-18 LAB — LIPID PANEL
CHOL/HDL RATIO: 2.6 ratio (ref <5.0)
Cholesterol: 124 mg/dL (ref <200)
HDL: 48 mg/dL (ref >40)
LDL CALC: 64 mg/dL (ref <100)
Triglycerides: 62 mg/dL (ref <150)
VLDL: 12 mg/dL (ref <30)

## 2016-11-18 LAB — COMPREHENSIVE METABOLIC PANEL
ALT: 15 U/L (ref 9–46)
AST: 13 U/L (ref 10–35)
Albumin: 4.2 g/dL (ref 3.6–5.1)
Alkaline Phosphatase: 81 U/L (ref 40–115)
BUN: 20 mg/dL (ref 7–25)
CHLORIDE: 106 mmol/L (ref 98–110)
CO2: 28 mmol/L (ref 20–31)
CREATININE: 0.83 mg/dL (ref 0.70–1.33)
Calcium: 9.4 mg/dL (ref 8.6–10.3)
GLUCOSE: 92 mg/dL (ref 65–99)
Potassium: 4.4 mmol/L (ref 3.5–5.3)
SODIUM: 139 mmol/L (ref 135–146)
Total Bilirubin: 0.6 mg/dL (ref 0.2–1.2)
Total Protein: 6.7 g/dL (ref 6.1–8.1)

## 2016-11-20 IMAGING — DX DG TOE GREAT 2+V*R*
3 series · 3 of 3 positions shown · non-contrast
Comparison: None.

CLINICAL DATA: Injury of the great toe.

EXAM:
RIGHT GREAT TOE

[toe ap]
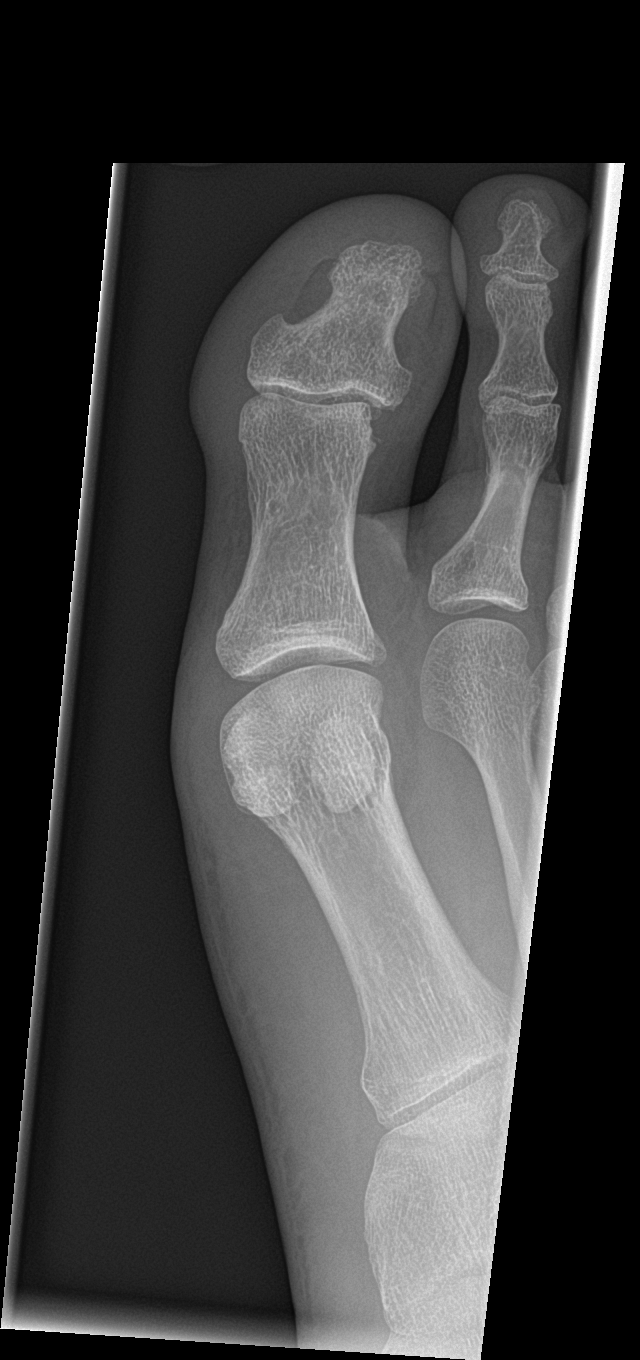

[toe obl]
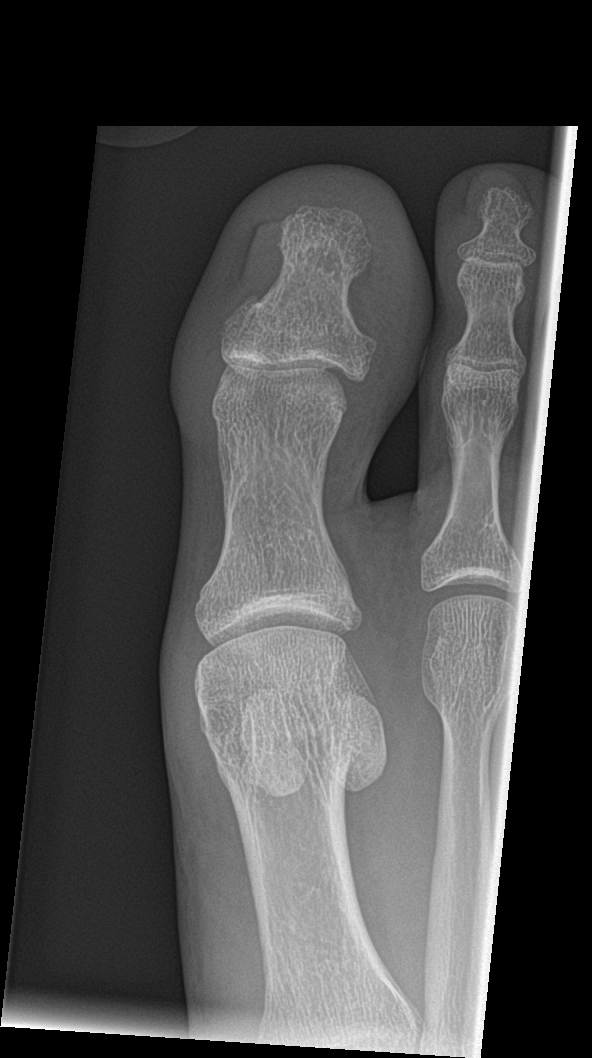

[toe lat]
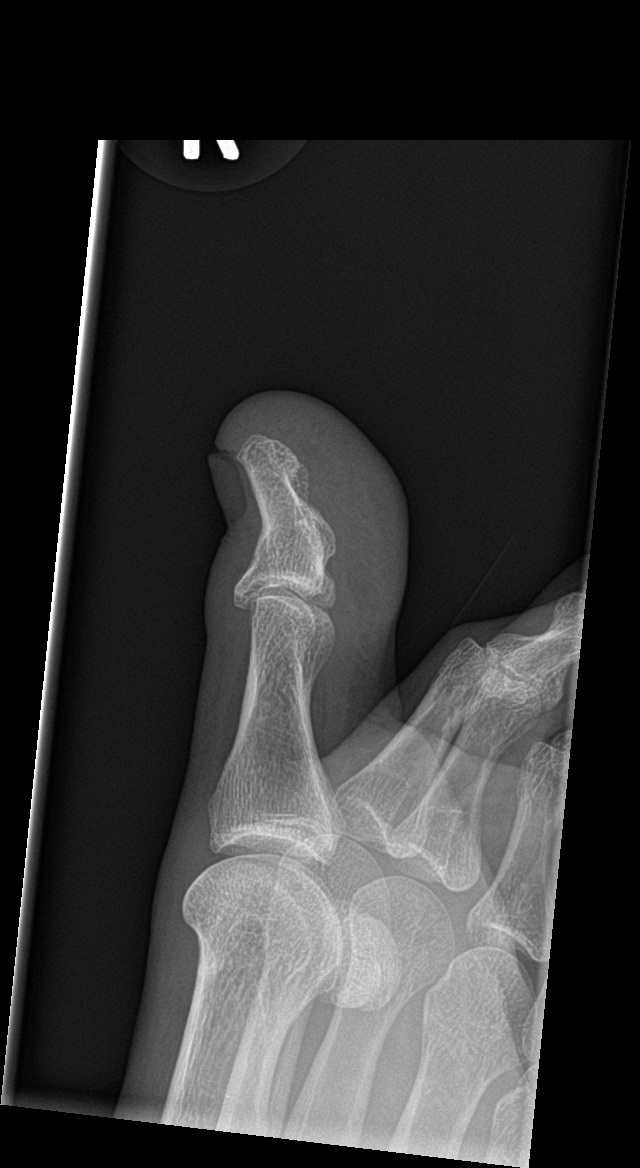

[3 of 3 positions shown; findings below may reference images not displayed]

FINDINGS: No fracture or dislocation. Joint spaces are preserved. No definite
erosions. Regional soft tissues appear normal. Radiopaque foreign
body.
IMPRESSION: No acute findings.

## 2016-11-21 ENCOUNTER — Ambulatory Visit: Payer: Self-pay | Admitting: Physician Assistant

## 2016-11-23 ENCOUNTER — Encounter (INDEPENDENT_AMBULATORY_CARE_PROVIDER_SITE_OTHER): Payer: Self-pay | Admitting: Specialist

## 2016-11-23 ENCOUNTER — Ambulatory Visit (INDEPENDENT_AMBULATORY_CARE_PROVIDER_SITE_OTHER): Payer: Self-pay | Admitting: Specialist

## 2016-11-23 VITALS — BP 103/66 | HR 60 | Ht 67.0 in | Wt 170.0 lb

## 2016-11-23 DIAGNOSIS — M48062 Spinal stenosis, lumbar region with neurogenic claudication: Secondary | ICD-10-CM

## 2016-11-23 DIAGNOSIS — S32000S Wedge compression fracture of unspecified lumbar vertebra, sequela: Secondary | ICD-10-CM

## 2016-11-23 DIAGNOSIS — M5416 Radiculopathy, lumbar region: Secondary | ICD-10-CM

## 2016-11-23 DIAGNOSIS — S22000S Wedge compression fracture of unspecified thoracic vertebra, sequela: Secondary | ICD-10-CM

## 2016-11-23 NOTE — Progress Notes (Signed)
Office Visit Note   Patient: Jacob Reilly           Date of Birth: November 13, 1965           MRN: [id number] Visit Date: 11/23/2016              Requested by: [name], PA-C [address] [place], [place] [address] PCP: [name], PA-C   Assessment & Plan: Visit Diagnoses:  1. Spinal stenosis of lumbar region with neurogenic claudication   2. Lumbar radiculopathy, chronic   3. Lumbar compression fracture, sequela   4. Thoracic compression fracture, sequela     Plan:Lumbar spine is suffering from osteoarthritis, only real proven treatments are Weight loss, NSIADs like alleve and exercise. Well padded shoes help. Iceand heat 2-3 times a day 15-20 mins at a time. Avoid bending, stooping and avoid lifting weights greater than 10 lbs. Avoid prolong standing and walking. Avoid frequent bending and stooping  No lifting greater than 10 lbs. May use ice or moist heat for pain. Weight loss is of benefit. Handicap license is approved.     Follow-Up Instructions: Return in about 6 months (around 05/23/2017).   Orders:  No orders of the defined types were placed in this encounter.  No orders of the defined types were placed in this encounter.     Procedures: No procedures performed   Clinical Data: Findings:  EMG/NCV with bilateral chronic L5 changes no active radiculopathy    Subjective: Chief Complaint  Patient presents with  . Lower Back - Follow-up    [name] is here to review his EMG/NCS results.  He states that nothing changed since we saw him last.    Review of Systems  Constitutional: Negative.   HENT: Negative.   Eyes: Negative.   Respiratory: Negative.   Cardiovascular: Negative.   Gastrointestinal: Negative.   Endocrine: Negative.   Genitourinary: Negative.   Musculoskeletal: Negative.   Skin: Negative.   Allergic/Immunologic: Negative.   Neurological: Negative.   Hematological: Negative.   Psychiatric/Behavioral: Negative.       Objective: Vital Signs: BP 103/66 (BP Location: Left Arm, Patient Position: Sitting)   Pulse 60   Ht 5\' 7"  (1.702 m)   Wt 170 lb (77.1 kg)   BMI 26.63 kg/m   Physical Exam  Constitutional: He is oriented to person, place, and time. He appears well-developed and well-nourished.  HENT:  Head: Normocephalic and atraumatic.  Eyes: EOM are normal. Pupils are equal, round, and reactive to light.  Neck: Normal range of motion. Neck supple.  Pulmonary/Chest: Effort normal and breath sounds normal.  Abdominal: Soft. Bowel sounds are normal.  Neurological: He is alert and oriented to person, place, and time.  Skin: Skin is warm and dry.  Psychiatric: He has a normal mood and affect. His behavior is normal. Judgment and thought content normal.    Back Exam   Tenderness  The patient is experiencing tenderness in the lumbar.  Range of Motion  Extension: abnormal  Flexion: abnormal  Lateral Bend Right: abnormal  Lateral Bend Left: abnormal  Rotation Right: abnormal  Rotation Left: abnormal   Muscle Strength  Right Quadriceps:  5/5  Left Quadriceps:  5/5  Right Hamstrings:  5/5  Left Hamstrings:  5/5   Tests  Straight leg raise right: negative Straight leg raise left: negative  Reflexes  Patellar: abnormal Achilles: abnormal Biceps: normal Babinski's sign: normal   Other  Toe Walk: normal Heel Walk: normal Sensation: normal Gait: normal  Erythema:  no back redness Scars: absent  Comments:  SLR is negative, numbness in the legs spots both legs.      Specialty Comments:  No specialty comments available.  Imaging: No results found.   PMFS History: Patient Active Problem List   Diagnosis Date Noted  . Hyperlipidemia 02/10/2016  . Low back pain 02/10/2016  . Cigarette nicotine dependence with nicotine-induced disorder 02/10/2016  . Chronic pain 01/15/2016  . Anxiety disorder 01/15/2016  . Polysubstance abuse 04/06/2015  . Substance induced mood  disorder (HCC) 04/06/2015  . Homicidal ideation   . MRSA infection 12/24/2012  . Smoker 12/24/2012  . Habitual alcohol use 12/24/2012  . Finger osteomyelitis, right (HCC) 11/26/2012   Past Medical History:  Diagnosis Date  . Anxiety   . Asthma    as child-now with allergy season only.  . Depression   . ETOH abuse   . Hyperlipidemia   . Kidney calculi   . Low back pain   . Pneumonia   . Polysubstance abuse   . PTSD (post-traumatic stress disorder)     Family History  Problem Relation Age of Onset  . Diabetes Mother   . Hypertension Mother   . Obesity Mother   . Heart disease Mother   . Kidney disease Mother   . COPD Mother   . Cancer Father     Past Surgical History:  Procedure Laterality Date  . BACK SURGERY    . HYDROCELE EXCISION Left 08/01/2013   Procedure: HYDROCELECTOMY ADULT;  Surgeon: Ky BarbanMohammad I Javaid, MD;  Location: [facility];  Service: Urology;  Laterality: Left;  . I&D EXTREMITY  10/24/2012   Procedure: IRRIGATION AND DEBRIDEMENT EXTREMITY;  Surgeon: Tami RibasKevin R Kuzma, MD;  Location: [facility];  Service: Orthopedics;  Laterality: Right;  . OPEN REDUCTION INTERNAL FIXATION (ORIF) DISTAL RADIAL FRACTURE Right 05/16/2015   Procedure: OPEN REDUCTION INTERNAL FIXATION (ORIF) RIGHT DISTAL RADIUS FRACTURE;  Surgeon: Dairl PonderMatthew Weingold, MD;  Location: [facility];  Service: Orthopedics;  Laterality: Right;  . OPEN REDUCTION INTERNAL FIXATION (ORIF) HAND Right thumb  . OPEN REDUCTION INTERNAL FIXATION (ORIF) METACARPAL Left 05/16/2015   Procedure: OPEN REDUCTION INTERNAL FIXATION OF MIDDLE PHALANGEAL FRACTURE, EXTENSOR TENDON REPAIR WITH ULNAR DIGITAL NERVE REPAIR MICROSCOPICALLY;  Surgeon: Dairl PonderMatthew Weingold, MD;  Location: [facility];  Service: Orthopedics;  Laterality: Left;  . SHOULDER ARTHROSCOPY WITH ROTATOR CUFF REPAIR AND SUBACROMIAL DECOMPRESSION Left 06/08/2016   Procedure: LEFT SHOULDER ARTHROSCOPY WITH ROTATOR CUFF REPAIR, SUBACROMIAL DECOMPRESSION, DISTAL CLAVICLE EXCISION;  Surgeon:  Tarry KosNaiping M Xu, MD;  Location: [facility];  Service: Orthopedics;  Laterality: Left;  . THUMB FUSION Right    Social History   Occupational History  . Not on file.   Social History Main Topics  . Smoking status: Current Every Day Smoker    Packs/day: 1.00    Years: 44.00    Types: Cigarettes  . Smokeless tobacco: Former NeurosurgeonUser    Types: Chew  . Alcohol use 4.8 oz/week    8 Cans of beer per week     Comment: hx heavy etoh august 2016  . Drug use: Yes    Types: Marijuana, Cocaine, Other-see comments     Comment: none since august 2016  . Sexual activity: Yes    Birth control/ protection: None

## 2016-11-23 NOTE — Patient Instructions (Addendum)
  Lumbar spine is suffering from osteoarthritis, only real proven treatments are Weight loss, NSIADs like alleve and exercise. Well padded shoes help. Iceand heat 2-3 times a day 15-20 mins at a time. Avoid bending, stooping and avoid lifting weights greater than 10 lbs. Avoid prolong standing and walking. Avoid frequent bending and stooping  No lifting greater than 10 lbs. May use ice or moist heat for pain. Weight loss is of benefit. Handicap license is approved.

## 2016-11-24 ENCOUNTER — Encounter: Payer: Self-pay | Admitting: Physician Assistant

## 2016-11-24 ENCOUNTER — Ambulatory Visit: Payer: Self-pay | Admitting: Physician Assistant

## 2016-11-24 VITALS — BP 102/68 | HR 66 | Temp 98.1°F | Ht 66.0 in | Wt 173.0 lb

## 2016-11-24 DIAGNOSIS — G8929 Other chronic pain: Secondary | ICD-10-CM

## 2016-11-24 DIAGNOSIS — Z125 Encounter for screening for malignant neoplasm of prostate: Secondary | ICD-10-CM

## 2016-11-24 DIAGNOSIS — F17219 Nicotine dependence, cigarettes, with unspecified nicotine-induced disorders: Secondary | ICD-10-CM

## 2016-11-24 DIAGNOSIS — F419 Anxiety disorder, unspecified: Secondary | ICD-10-CM

## 2016-11-24 DIAGNOSIS — E785 Hyperlipidemia, unspecified: Secondary | ICD-10-CM

## 2016-11-24 NOTE — Progress Notes (Signed)
BP 102/68 (BP Location: Left Arm, Patient Position: Sitting, Cuff Size: Normal)   Pulse 66   Temp 98.1 F (36.7 C) (Other (Comment))   Ht 5\' 6"  (1.676 m)   Wt 173 lb (78.5 kg)   SpO2 96%   BMI 27.92 kg/m    Subjective:    Patient ID: Jacob Reilly, male    DOB: 1965/12/24, 51 y.o.   MRN: [id number]  HPI: Jacob Reilly is a 52 y.o. male presenting on 11/24/2016 for Hyperlipidemia   HPI   Pt is back in with [facility] and is feeling more calm today  Pt is still seeing orthopedics for chronic back pain  He is Still smoking.    Relevant past medical, surgical, family and social history reviewed and updated as indicated. Interim medical history since our last visit reviewed. Allergies and medications reviewed and updated.   Current Outpatient Prescriptions:  .  albuterol (PROVENTIL HFA;VENTOLIN HFA) 108 (90 BASE) MCG/ACT inhaler, Inhale 2 puffs into the lungs every 6 (six) hours as needed for wheezing or shortness of breath., Disp: , Rfl:  .  atorvastatin (LIPITOR) 20 MG tablet, Take 1 tablet (20 mg total) by mouth daily., Disp: 90 tablet, Rfl: 1 .  Calcium Carb-Cholecalciferol (CALCIUM 1000 + D PO), Take by mouth., Disp: , Rfl:  .  citalopram (CELEXA) 20 MG tablet, Take 20 mg by mouth daily., Disp: , Rfl:  .  Multiple Vitamin (MULTIVITAMIN WITH MINERALS) TABS tablet, Take 1 tablet by mouth daily., Disp: , Rfl:  .  traZODone (DESYREL) 100 MG tablet, Take 100 mg by mouth at bedtime., Disp: , Rfl:    Review of Systems  Constitutional: Negative for appetite change, chills, diaphoresis, fatigue, fever and unexpected weight change.  HENT: Negative for congestion, dental problem, drooling, ear pain, facial swelling, hearing loss, mouth sores, sneezing, sore throat, trouble swallowing and voice change.   Eyes: Negative for pain, discharge, redness, itching and visual disturbance.  Respiratory: Negative for cough, choking, shortness of breath and wheezing.   Cardiovascular: Negative for  chest pain, palpitations and leg swelling.  Gastrointestinal: Negative for abdominal pain, blood in stool, constipation, diarrhea and vomiting.  Endocrine: Positive for cold intolerance. Negative for heat intolerance and polydipsia.  Genitourinary: Negative for decreased urine volume, dysuria and hematuria.  Musculoskeletal: Positive for arthralgias and back pain. Negative for gait problem.  Skin: Negative for rash.  Allergic/Immunologic: Positive for environmental allergies.  Neurological: Negative for seizures, syncope, light-headedness and headaches.  Hematological: Negative for adenopathy.  Psychiatric/Behavioral: Positive for dysphoric mood. Negative for agitation and suicidal ideas. The patient is nervous/anxious.     Per HPI unless specifically indicated above     Objective:    BP 102/68 (BP Location: Left Arm, Patient Position: Sitting, Cuff Size: Normal)   Pulse 66   Temp 98.1 F (36.7 C) (Other (Comment))   Ht 5\' 6"  (1.676 m)   Wt 173 lb (78.5 kg)   SpO2 96%   BMI 27.92 kg/m   Wt Readings from Last 3 Encounters:  11/24/16 173 lb (78.5 kg)  11/23/16 170 lb (77.1 kg)  08/17/16 178 lb 12 oz (81.1 kg)    Physical Exam  Constitutional: He is oriented to person, place, and time. He appears well-developed and well-nourished.  HENT:  Head: Normocephalic and atraumatic.  Neck: Neck supple.  Cardiovascular: Normal rate and regular rhythm.   Pulmonary/Chest: Effort normal and breath sounds normal. He has no wheezes.  Abdominal: Soft. Bowel sounds are normal. There is no hepatosplenomegaly.  There is no tenderness.  Musculoskeletal: He exhibits no edema.  Lymphadenopathy:    He has no cervical adenopathy.  Neurological: He is alert and oriented to person, place, and time.  Skin: Skin is warm and dry.  Psychiatric: He has a normal mood and affect. His behavior is normal.  Vitals reviewed.   Results for orders placed or performed in visit on 11/14/16  Lipid Profile   Result Value Ref Range   Cholesterol 124 <200 mg/dL   Triglycerides 62 <161<150 mg/dL   HDL 48 >09>40 mg/dL   Total CHOL/HDL Ratio 2.6 <5.0 Ratio   VLDL 12 <30 mg/dL   LDL Cholesterol 64 <604<100 mg/dL  Comprehensive metabolic panel  Result Value Ref Range   Sodium 139 135 - 146 mmol/L   Potassium 4.4 3.5 - 5.3 mmol/L   Chloride 106 98 - 110 mmol/L   CO2 28 20 - 31 mmol/L   Glucose, Bld 92 65 - 99 mg/dL   BUN 20 7 - 25 mg/dL   Creat 5.400.83 9.810.70 - 1.911.33 mg/dL   Total Bilirubin 0.6 0.2 - 1.2 mg/dL   Alkaline Phosphatase 81 40 - 115 U/L   AST 13 10 - 35 U/L   ALT 15 9 - 46 U/L   Total Protein 6.7 6.1 - 8.1 g/dL   Albumin 4.2 3.6 - 5.1 g/dL   Calcium 9.4 8.6 - 47.810.3 mg/dL      Assessment & Plan:    Encounter Diagnoses  Name Primary?  . Hyperlipidemia, unspecified hyperlipidemia type Yes  . Cigarette nicotine dependence with nicotine-induced disorder   . Other chronic pain   . Anxiety disorder, unspecified type   . Screening for prostate cancer     -reviewed labs with pt -pt to continue current medications -pt to continue with [facility] and orthopedics -counseled smoking cessation -pt to follow up in 3 months.  RTO sooner prn

## 2017-02-22 ENCOUNTER — Ambulatory Visit: Payer: Self-pay | Admitting: Physician Assistant

## 2017-02-22 ENCOUNTER — Other Ambulatory Visit: Payer: Self-pay | Admitting: Physician Assistant

## 2017-02-22 ENCOUNTER — Encounter: Payer: Self-pay | Admitting: Physician Assistant

## 2017-02-22 VITALS — BP 90/60 | HR 62 | Temp 97.9°F | Ht 66.0 in | Wt 171.5 lb

## 2017-02-22 DIAGNOSIS — J449 Chronic obstructive pulmonary disease, unspecified: Secondary | ICD-10-CM

## 2017-02-22 DIAGNOSIS — Z1211 Encounter for screening for malignant neoplasm of colon: Secondary | ICD-10-CM

## 2017-02-22 DIAGNOSIS — F17219 Nicotine dependence, cigarettes, with unspecified nicotine-induced disorders: Secondary | ICD-10-CM

## 2017-02-22 DIAGNOSIS — Z125 Encounter for screening for malignant neoplasm of prostate: Secondary | ICD-10-CM

## 2017-02-22 DIAGNOSIS — E785 Hyperlipidemia, unspecified: Secondary | ICD-10-CM

## 2017-02-22 NOTE — Progress Notes (Signed)
BP 90/60 (BP Location: Left Arm, Patient Position: Sitting, Cuff Size: Normal)   Pulse 62   Temp 97.9 F (36.6 C)   Ht 5\' 6"  (1.676 m)   Wt 171 lb 8 oz (77.8 kg)   SpO2 98%   BMI 27.68 kg/m    Subjective:    Patient ID: Jacob Reilly, male    DOB: 1966-08-06, 51 y.o.   MRN: [id number]  HPI: Jacob Reilly is a 51 y.o. male presenting on 02/22/2017 for Hyperlipidemia   HPI Pt didn't get labs- couldn't contact- phone changed  Pt feels well and says he is doing good  Pt still going to [facility]  Relevant past medical, surgical, family and social history reviewed and updated as indicated. Interim medical history since our last visit reviewed. Allergies and medications reviewed and updated.   Current Outpatient Prescriptions:  .  albuterol (PROVENTIL HFA;VENTOLIN HFA) 108 (90 BASE) MCG/ACT inhaler, Inhale 2 puffs into the lungs every 6 (six) hours as needed for wheezing or shortness of breath., Disp: , Rfl:  .  atorvastatin (LIPITOR) 20 MG tablet, Take 1 tablet (20 mg total) by mouth daily., Disp: 90 tablet, Rfl: 1 .  Calcium Carb-Cholecalciferol (CALCIUM 1000 + D PO), Take by mouth., Disp: , Rfl:  .  citalopram (CELEXA) 20 MG tablet, Take 20 mg by mouth daily., Disp: , Rfl:  .  traZODone (DESYREL) 100 MG tablet, Take 100 mg by mouth at bedtime., Disp: , Rfl:  .  Multiple Vitamin (MULTIVITAMIN WITH MINERALS) TABS tablet, Take 1 tablet by mouth daily., Disp: , Rfl:    Review of Systems  Constitutional: Positive for fatigue. Negative for appetite change, chills, diaphoresis, fever and unexpected weight change.  HENT: Negative for congestion, dental problem, drooling, ear pain, facial swelling, hearing loss, mouth sores, sneezing, sore throat, trouble swallowing and voice change.   Eyes: Negative for pain, discharge, redness, itching and visual disturbance.  Respiratory: Positive for wheezing. Negative for cough, choking and shortness of breath.   Cardiovascular: Negative for chest  pain, palpitations and leg swelling.  Gastrointestinal: Negative for abdominal pain, blood in stool, constipation, diarrhea and vomiting.  Endocrine: Negative for cold intolerance, heat intolerance and polydipsia.  Genitourinary: Negative for decreased urine volume, dysuria and hematuria.  Musculoskeletal: Positive for arthralgias and back pain. Negative for gait problem.  Skin: Negative for rash.  Allergic/Immunologic: Positive for environmental allergies.  Neurological: Negative for seizures, syncope, light-headedness and headaches.  Hematological: Negative for adenopathy.  Psychiatric/Behavioral: Positive for dysphoric mood. Negative for agitation and suicidal ideas. The patient is nervous/anxious.     Per HPI unless specifically indicated above     Objective:    BP 90/60 (BP Location: Left Arm, Patient Position: Sitting, Cuff Size: Normal)   Pulse 62   Temp 97.9 F (36.6 C)   Ht 5\' 6"  (1.676 m)   Wt 171 lb 8 oz (77.8 kg)   SpO2 98%   BMI 27.68 kg/m   Wt Readings from Last 3 Encounters:  02/22/17 171 lb 8 oz (77.8 kg)  11/24/16 173 lb (78.5 kg)  11/23/16 170 lb (77.1 kg)    Physical Exam  Constitutional: He is oriented to person, place, and time. He appears well-developed and well-nourished.  HENT:  Head: Normocephalic and atraumatic.  Neck: Neck supple.  Cardiovascular: Normal rate and regular rhythm.   Pulmonary/Chest: Effort normal and breath sounds normal. He has no wheezes.  Abdominal: Soft. Bowel sounds are normal. There is no hepatosplenomegaly. There is no tenderness.  Musculoskeletal: He exhibits no edema.  Lymphadenopathy:    He has no cervical adenopathy.  Neurological: He is alert and oriented to person, place, and time.  Skin: Skin is warm and dry.  Psychiatric: He has a normal mood and affect. His behavior is normal.  Vitals reviewed.       Assessment & Plan:   Encounter Diagnoses  Name Primary?  . Hyperlipidemia, unspecified hyperlipidemia type  Yes  . Cigarette nicotine dependence with nicotine-induced disorder   . Screening for prostate cancer   . Special screening for malignant neoplasms, colon   . Chronic obstructive pulmonary disease, unspecified COPD type (HCC)     -Pt to get fasting labs drawn tomorrow -iFOBT given for colon cancer screening -Pt to follow up in 6 months. RTO sooner prn

## 2017-02-23 ENCOUNTER — Encounter (INDEPENDENT_AMBULATORY_CARE_PROVIDER_SITE_OTHER): Payer: Self-pay | Admitting: Specialist

## 2017-02-23 ENCOUNTER — Telehealth (INDEPENDENT_AMBULATORY_CARE_PROVIDER_SITE_OTHER): Payer: Self-pay | Admitting: Specialist

## 2017-02-23 NOTE — Telephone Encounter (Signed)
Patient called, left msg on on vm. Asking about a letter that Dr. Otelia SergeantNitka was going to do for him for Social Security Disability. Patient callback [phone]

## 2017-02-23 NOTE — Telephone Encounter (Signed)
Patient called, left msg on on vm. Asking about a letter that Dr. Otelia SergeantNitka was going to do for him for Social Security Disability

## 2017-02-24 NOTE — Telephone Encounter (Signed)
Letter is in EPIC under letter tab in chart review.

## 2017-02-27 ENCOUNTER — Other Ambulatory Visit (HOSPITAL_COMMUNITY)
Admission: RE | Admit: 2017-02-27 | Discharge: 2017-02-27 | Disposition: A | Discharge status: Home / Self Care | Payer: Self-pay | Source: Ambulatory Visit | Attending: Physician Assistant | Admitting: Physician Assistant

## 2017-02-27 LAB — IFOBT (OCCULT BLOOD): IMMUNOLOGICAL FECAL OCCULT BLOOD TEST: NEGATIVE

## 2017-02-27 LAB — COMPREHENSIVE METABOLIC PANEL
ALT: 29 U/L (ref 17–63)
AST: 22 U/L (ref 15–41)
Albumin: 4.4 g/dL (ref 3.5–5.0)
Alkaline Phosphatase: 81 U/L (ref 38–126)
Anion gap: 7 (ref 5–15)
BILIRUBIN TOTAL: 0.5 mg/dL (ref 0.3–1.2)
BUN: 25 mg/dL — AB (ref 6–20)
CHLORIDE: 104 mmol/L (ref 101–111)
CO2: 26 mmol/L (ref 22–32)
CREATININE: 0.83 mg/dL (ref 0.61–1.24)
Calcium: 9.2 mg/dL (ref 8.9–10.3)
GFR calc Af Amer: >60 mL/min (ref >60)
Glucose, Bld: 92 mg/dL (ref 65–99)
Potassium: 4.1 mmol/L (ref 3.5–5.1)
Sodium: 137 mmol/L (ref 135–145)
Total Protein: 7.3 g/dL (ref 6.5–8.1)

## 2017-02-27 LAB — LIPID PANEL
CHOLESTEROL: 142 mg/dL (ref 0–200)
HDL: 51 mg/dL (ref >40)
LDL Cholesterol: 80 mg/dL (ref 0–99)
TRIGLYCERIDES: 54 mg/dL (ref <150)
Total CHOL/HDL Ratio: 2.8 RATIO
VLDL: 11 mg/dL (ref 0–40)

## 2017-02-27 LAB — PSA: PSA: 0.6 ng/mL (ref 0.00–4.00)

## 2017-02-27 NOTE — Telephone Encounter (Signed)
Pt notified Friday 02/24/2017 that letter was ready for pick up at the front desk

## 2017-03-13 ENCOUNTER — Other Ambulatory Visit: Payer: Self-pay | Admitting: Physician Assistant

## 2017-04-20 ENCOUNTER — Other Ambulatory Visit: Payer: Self-pay | Admitting: Physician Assistant

## 2017-04-20 DIAGNOSIS — E785 Hyperlipidemia, unspecified: Secondary | ICD-10-CM

## 2017-05-25 ENCOUNTER — Encounter (INDEPENDENT_AMBULATORY_CARE_PROVIDER_SITE_OTHER): Payer: Self-pay | Admitting: Specialist

## 2017-05-25 ENCOUNTER — Ambulatory Visit (INDEPENDENT_AMBULATORY_CARE_PROVIDER_SITE_OTHER): Payer: Self-pay | Admitting: Specialist

## 2017-05-25 DIAGNOSIS — M48062 Spinal stenosis, lumbar region with neurogenic claudication: Secondary | ICD-10-CM

## 2017-05-25 MED ORDER — TRAMADOL HCL 50 MG PO TABS: 50.0000 mg | ORAL_TABLET | Freq: Four times a day (QID) | ORAL | 0 refills | Status: DC | PRN

## 2017-05-25 NOTE — Progress Notes (Signed)
Office Visit Note   Patient: Jacob Reilly           Date of Birth: Dec 16, 1965           MRN: [id number] Visit Date: 05/25/2017              Requested by: [name], PA-C [address] [place], [place] [address] PCP: [name], PA-C   Assessment & Plan: Visit Diagnoses:  1. Spinal stenosis of lumbar region with neurogenic claudication     Plan: Avoid bending, stooping and avoid lifting weights greater than 10 lbs. Avoid prolong standing and walking. Avoid frequent bending and stooping  No lifting greater than 10 lbs. May use ice or moist heat for pain. Weight loss is of benefit. Handicap license is approved. Dr. [place] Blas secretary/Assistant will call to arrange for epidural steroid injection   Follow-Up Instructions: Return in about 4 weeks (around 06/22/2017).   Orders:  No orders of the defined types were placed in this encounter.  No orders of the defined types were placed in this encounter.     Procedures: No procedures performed   Clinical Data: No additional findings.   Subjective: Chief Complaint  Patient presents with  . Lower Back - Pain, Follow-up    HPI  Review of Systems   Objective: Vital Signs: There were no vitals taken for this visit.  Physical Exam  Ortho Exam  Specialty Comments:  No specialty comments available.  Imaging: No results found.   PMFS History: Patient Active Problem List   Diagnosis Date Noted  . Hyperlipidemia 02/10/2016  . Low back pain 02/10/2016  . Cigarette nicotine dependence with nicotine-induced disorder 02/10/2016  . Chronic pain 01/15/2016  . Anxiety disorder 01/15/2016  . Polysubstance abuse 04/06/2015  . Substance induced mood disorder (HCC) 04/06/2015  . Homicidal ideation   . MRSA infection 12/24/2012  . Smoker 12/24/2012  . Habitual alcohol use 12/24/2012  . Finger osteomyelitis, right (HCC) 11/26/2012   Past Medical History:  Diagnosis Date  . Anxiety   . Asthma    as  child-now with allergy season only.  . Depression   . ETOH abuse   . Hyperlipidemia   . Kidney calculi   . Low back pain   . Pneumonia   . Polysubstance abuse   . PTSD (post-traumatic stress disorder)     Family History  Problem Relation Age of Onset  . Diabetes Mother   . Hypertension Mother   . Obesity Mother   . Heart disease Mother   . Kidney disease Mother   . COPD Mother   . Cancer Father     Past Surgical History:  Procedure Laterality Date  . BACK SURGERY    . HYDROCELE EXCISION Left 08/01/2013   Procedure: HYDROCELECTOMY ADULT;  Surgeon: Ky Barban, MD;  Location: [facility];  Service: Urology;  Laterality: Left;  . I&D EXTREMITY  10/24/2012   Procedure: IRRIGATION AND DEBRIDEMENT EXTREMITY;  Surgeon: Tami Ribas, MD;  Location: [facility] OR;  Service: Orthopedics;  Laterality: Right;  . OPEN REDUCTION INTERNAL FIXATION (ORIF) DISTAL RADIAL FRACTURE Right 05/16/2015   Procedure: OPEN REDUCTION INTERNAL FIXATION (ORIF) RIGHT DISTAL RADIUS FRACTURE;  Surgeon: Dairl Ponder, MD;  Location: [facility] OR;  Service: Orthopedics;  Laterality: Right;  . OPEN REDUCTION INTERNAL FIXATION (ORIF) HAND Right thumb  . OPEN REDUCTION INTERNAL FIXATION (ORIF) METACARPAL Left 05/16/2015   Procedure: OPEN REDUCTION INTERNAL FIXATION OF MIDDLE PHALANGEAL FRACTURE, EXTENSOR TENDON REPAIR WITH ULNAR DIGITAL NERVE REPAIR MICROSCOPICALLY;  Surgeon: Dairl PonderMatthew Weingold, MD;  [facility];  Service: Orthopedics;  Laterality: Left;  . SHOULDER ARTHROSCOPY WITH ROTATOR CUFF REPAIR AND SUBACROMIAL DECOMPRESSION Left 06/08/2016   Procedure: LEFT SHOULDER ARTHROSCOPY WITH ROTATOR CUFF REPAIR, SUBACROMIAL DECOMPRESSION, DISTAL CLAVICLE EXCISION;  Surgeon: Tarry KosNaiping M Xu, MD;  Location: [facility];  Service: Orthopedics;  Laterality: Left;  . THUMB FUSION Right    Social History   Occupational History  . Not on file.   Social History Main Topics  . Smoking status: Current Every Day Smoker     Packs/day: 0.25    Years: 44.00    Types: Cigarettes  . Smokeless tobacco: Former NeurosurgeonUser    Types: Chew  . Alcohol use 4.8 oz/week    8 Cans of beer per week     Comment: hx heavy etoh august 2016  . Drug use: Yes    Types: Marijuana, Cocaine, Other-see comments     Comment: none since august 2016  . Sexual activity: Yes    Birth control/ protection: None

## 2017-05-25 NOTE — Patient Instructions (Signed)
Avoid bending, stooping and avoid lifting weights greater than 10 lbs. Avoid prolong standing and walking. Avoid frequent bending and stooping  No lifting greater than 10 lbs. May use ice or moist heat for pain. Weight loss is of benefit. Handicap license is approved. Dr. Newton's secretary/Assistant will call to arrange for epidural steroid injection  

## 2017-06-08 ENCOUNTER — Ambulatory Visit (INDEPENDENT_AMBULATORY_CARE_PROVIDER_SITE_OTHER): Payer: Self-pay

## 2017-06-08 ENCOUNTER — Encounter (INDEPENDENT_AMBULATORY_CARE_PROVIDER_SITE_OTHER): Payer: Self-pay | Admitting: Physical Medicine and Rehabilitation

## 2017-06-08 ENCOUNTER — Ambulatory Visit (INDEPENDENT_AMBULATORY_CARE_PROVIDER_SITE_OTHER): Payer: Self-pay | Admitting: Physical Medicine and Rehabilitation

## 2017-06-08 VITALS — BP 111/78 | HR 57 | Temp 98.3°F

## 2017-06-08 DIAGNOSIS — M5416 Radiculopathy, lumbar region: Secondary | ICD-10-CM

## 2017-06-08 DIAGNOSIS — M961 Postlaminectomy syndrome, not elsewhere classified: Secondary | ICD-10-CM

## 2017-06-08 MED ORDER — LIDOCAINE HCL (PF) 1 % IJ SOLN
2.0000 mL | Freq: Once | INTRAMUSCULAR | Status: AC
  Administered 2017-06-08: 2 mL

## 2017-06-08 MED ORDER — BETAMETHASONE SOD PHOS & ACET 6 (3-3) MG/ML IJ SUSP
12.0000 mg | Freq: Once | INTRAMUSCULAR | Status: AC
  Administered 2017-06-08: 12 mg

## 2017-06-08 NOTE — Progress Notes (Signed)
Jacob Reilly - 51 y.o. male MRN [id number]  Date of birth: June 26, 1966  Office Visit Note: Visit Date: 06/08/2017 PCP: [name], PA-C Referred by: [name], PA-C  Subjective: Chief Complaint  Patient presents with  . Lower Back - Pain   HPI: Mr. Jacob Reilly is a 51 year old patient of Dr. Otelia Sergeant this. 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 Dr. Otelia Sergeant requests diagnostic and hopefully therapeutic epidural injection. Based on the patient's symptoms we did elect to complete an L5-S1 intralaminar epidural injection. Depending on relief would look at L2-3.    ROS Otherwise per HPI.  Assessment & Plan: Visit Diagnoses: No diagnosis found.  Plan: Findings:  Diagnostic and hopefully therapeutic L5-S1 intralaminar epidural steroid injection.     Meds & Orders: No orders of the defined types were placed in this encounter.  No orders of the defined types were placed in this encounter.   Follow-up: No Follow-up on file.   Procedures: No procedures performed  No notes on file   Clinical History: No specialty comments available.  He reports that he has been smoking Cigarettes.  He has a 11.00 pack-year smoking history. He has quit using smokeless tobacco. His smokeless tobacco use included Chew. No results for input(s): HGBA1C, LABURIC in the last 8760 hours.  Objective:  VS:  HT:    WT:   BMI:     BP:   HR: bpm  TEMP: ( )  RESP:  Physical Exam  Musculoskeletal:  The patient ambulates without aid with a forward flexed spine with good distal strength.    Ortho Exam Imaging: No results found.  Past Medical/Family/Surgical/Social History: Medications & Allergies reviewed per EMR Patient Active Problem List   Diagnosis Date Noted  . Hyperlipidemia 02/10/2016  . Low back pain 02/10/2016  . Cigarette nicotine dependence with nicotine-induced disorder 02/10/2016  . Chronic pain 01/15/2016  . Anxiety disorder  01/15/2016  . Polysubstance abuse 04/06/2015  . Substance induced mood disorder (HCC) 04/06/2015  . Homicidal ideation   . MRSA infection 12/24/2012  . Smoker 12/24/2012  . Habitual alcohol use 12/24/2012  . Finger osteomyelitis, right (HCC) 11/26/2012   Past Medical History:  Diagnosis Date  . Anxiety   . Asthma    as child-now with allergy season only.  . Depression   . ETOH abuse   . Hyperlipidemia   . Kidney calculi   . Low back pain   . Pneumonia   . Polysubstance abuse   . PTSD (post-traumatic stress disorder)    Family History  Problem Relation Age of Onset  . Diabetes Mother   . Hypertension Mother   . Obesity Mother   . Heart disease Mother   . Kidney disease Mother   . COPD Mother   . Cancer Father    Past Surgical History:  Procedure Laterality Date  . BACK SURGERY    . HYDROCELE EXCISION Left 08/01/2013   Procedure: HYDROCELECTOMY ADULT;  Surgeon: Ky Barban, MD;  Location: [facility];  Service: Urology;  Laterality: Left;  . I&D EXTREMITY  10/24/2012   Procedure: IRRIGATION AND DEBRIDEMENT EXTREMITY;  Surgeon: Tami Ribas, MD;  Location: [facility];  Service: Orthopedics;  Laterality: Right;  . OPEN REDUCTION INTERNAL FIXATION (ORIF) DISTAL RADIAL FRACTURE Right 05/16/2015   Procedure: OPEN REDUCTION INTERNAL FIXATION (ORIF) RIGHT DISTAL RADIUS FRACTURE;  Surgeon: Dairl Ponder, MD;  Location: [facility];  Service: Orthopedics;  Laterality: Right;  .  OPEN REDUCTION INTERNAL FIXATION (ORIF) HAND Right thumb  . OPEN REDUCTION INTERNAL FIXATION (ORIF) METACARPAL Left 05/16/2015   Procedure: OPEN REDUCTION INTERNAL FIXATION OF MIDDLE PHALANGEAL FRACTURE, EXTENSOR TENDON REPAIR WITH ULNAR DIGITAL NERVE REPAIR MICROSCOPICALLY;  Surgeon: Dairl Ponder, MD;  Location: [facility];  Service: Orthopedics;  Laterality: Left;  . SHOULDER ARTHROSCOPY WITH ROTATOR CUFF REPAIR AND SUBACROMIAL DECOMPRESSION Left 06/08/2016   Procedure: LEFT SHOULDER ARTHROSCOPY WITH ROTATOR CUFF  REPAIR, SUBACROMIAL DECOMPRESSION, DISTAL CLAVICLE EXCISION;  Surgeon: Tarry Kos, MD;  Location: [facility];  Service: Orthopedics;  Laterality: Left;  . THUMB FUSION Right    Social History   Occupational History  . Not on file.   Social History Main Topics  . Smoking status: Current Every Day Smoker    Packs/day: 0.25    Years: 44.00    Types: Cigarettes  . Smokeless tobacco: Former Neurosurgeon    Types: Chew  . Alcohol use 4.8 oz/week    8 Cans of beer per week     Comment: hx heavy etoh august 2016  . Drug use: Yes    Types: Marijuana, Cocaine, Other-see comments     Comment: none since august 2016  . Sexual activity: Yes    Birth control/ protection: None

## 2017-06-08 NOTE — Patient Instructions (Signed)

## 2017-06-08 NOTE — Progress Notes (Deleted)
Lower back pain radiating down both legs- right worse than left. Also has some pain just below shoulder blades.

## 2017-06-13 NOTE — Procedures (Signed)
Lumbar Epidural Steroid Injection - Interlaminar Approach with Fluoroscopic Guidance  Patient: Jacob Reilly      Date of Birth: 13-Dec-1965 MRN: [id number] PCP: [name], PA-C      Visit Date: 06/08/2017   Universal Protocol:     Consent Given By: the patient  Position: PRONE  Additional Comments: Vital signs were monitored before and after the procedure. Patient was prepped and draped in the usual sterile fashion. The correct patient, procedure, and site was verified.   Injection Procedure Details:  Procedure Site One Meds Administered:  Meds ordered this encounter  Medications  . lidocaine (PF) (XYLOCAINE) 1 % injection 2 mL  . betamethasone acetate-betamethasone sodium phosphate (CELESTONE) injection 12 mg     Laterality: Bilateral  Location/Site:  L5-S1  Needle size: 20 G  Needle type: Tuohy  Needle Placement: Paramedian epidural  Findings:  -Contrast Used: 1 mL iohexol 180 mg iodine/mL   -Comments: Excellent flow of contrast into the epidural space.  Procedure Details: Using a paramedian approach from the side mentioned above, the region overlying the inferior lamina was localized under fluoroscopic visualization and the soft tissues overlying this structure were infiltrated with 4 ml. of 1% Lidocaine without Epinephrine. The Tuohy needle was inserted into the epidural space using a paramedian approach.   The epidural space was localized using loss of resistance along with lateral and bi-planar fluoroscopic views.  After negative aspirate for air, blood, and CSF, a 2 ml. volume of Isovue-250 was injected into the epidural space and the flow of contrast was observed. Radiographs were obtained for documentation purposes.    The injectate was administered into the level noted above.   Additional Comments:  The patient tolerated the procedure well Dressing: Band-Aid    Post-procedure details: Patient was observed during the procedure. Post-procedure  instructions were reviewed.  Patient left the clinic in stable condition.

## 2017-08-22 ENCOUNTER — Other Ambulatory Visit (HOSPITAL_COMMUNITY)
Admission: RE | Admit: 2017-08-22 | Discharge: 2017-08-22 | Disposition: A | Discharge status: Home / Self Care | Payer: MEDICAID | Source: Ambulatory Visit | Attending: Physician Assistant | Admitting: Physician Assistant

## 2017-08-22 DIAGNOSIS — E785 Hyperlipidemia, unspecified: Secondary | ICD-10-CM

## 2017-08-22 LAB — COMPREHENSIVE METABOLIC PANEL
ALT: 14 U/L — AB (ref 17–63)
ANION GAP: 9 (ref 5–15)
AST: 18 U/L (ref 15–41)
Albumin: 4.2 g/dL (ref 3.5–5.0)
Alkaline Phosphatase: 95 U/L (ref 38–126)
BUN: 20 mg/dL (ref 6–20)
CHLORIDE: 105 mmol/L (ref 101–111)
CO2: 25 mmol/L (ref 22–32)
Calcium: 9.5 mg/dL (ref 8.9–10.3)
Creatinine, Ser: 0.67 mg/dL (ref 0.61–1.24)
GLUCOSE: 108 mg/dL — AB (ref 65–99)
POTASSIUM: 4.2 mmol/L (ref 3.5–5.1)
Sodium: 139 mmol/L (ref 135–145)
Total Bilirubin: 0.5 mg/dL (ref 0.3–1.2)
Total Protein: 7.4 g/dL (ref 6.5–8.1)

## 2017-08-22 LAB — LIPID PANEL
Cholesterol: 144 mg/dL (ref 0–200)
HDL: 49 mg/dL (ref >40)
LDL CALC: 82 mg/dL (ref 0–99)
TRIGLYCERIDES: 64 mg/dL (ref <150)
Total CHOL/HDL Ratio: 2.9 RATIO
VLDL: 13 mg/dL (ref 0–40)

## 2017-08-23 ENCOUNTER — Encounter: Payer: Self-pay | Admitting: Physician Assistant

## 2017-08-23 ENCOUNTER — Ambulatory Visit: Payer: Self-pay | Admitting: Physician Assistant

## 2017-08-23 VITALS — BP 110/74 | HR 76 | Temp 98.1°F | Ht 66.0 in | Wt 174.0 lb

## 2017-08-23 DIAGNOSIS — F17219 Nicotine dependence, cigarettes, with unspecified nicotine-induced disorders: Secondary | ICD-10-CM

## 2017-08-23 DIAGNOSIS — F419 Anxiety disorder, unspecified: Secondary | ICD-10-CM

## 2017-08-23 DIAGNOSIS — E785 Hyperlipidemia, unspecified: Secondary | ICD-10-CM

## 2017-08-23 DIAGNOSIS — Z125 Encounter for screening for malignant neoplasm of prostate: Secondary | ICD-10-CM

## 2017-08-23 DIAGNOSIS — J449 Chronic obstructive pulmonary disease, unspecified: Secondary | ICD-10-CM

## 2017-08-23 NOTE — Progress Notes (Signed)
BP 110/74 (BP Location: Left Arm, Patient Position: Sitting, Cuff Size: Normal)   Pulse 76   Temp 98.1 F (36.7 C) (Other (Comment))   Ht 5\' 6"  (1.676 m)   Wt 174 lb (78.9 kg)   SpO2 96%   BMI 28.08 kg/m    Subjective:    Patient ID: Jacob Reilly, male    DOB: 02/11/1966, 51 y.o.   MRN: [id number]  HPI: Jacob BatheMonte Dobis is a 51 y.o. male presenting on 08/23/2017 for Hyperlipidemia   HPI   Pt is no longer going to [facility].  He is still having anxiety and depression.  He says what they were giving him wasn't working and "there's no talking to that guy".   He says he isn't as bad as he was.   Relevant past medical, surgical, family and social history reviewed and updated as indicated. Interim medical history since our last visit reviewed. Allergies and medications reviewed and updated.   Current Outpatient Medications:  .  albuterol (PROVENTIL HFA;VENTOLIN HFA) 108 (90 BASE) MCG/ACT inhaler, Inhale 2 puffs into the lungs every 6 (six) hours as needed for wheezing or shortness of breath., Disp: , Rfl:  .  atorvastatin (LIPITOR) 20 MG tablet, TAKE 1 Tablet BY MOUTH ONCE DAILY, Disp: 90 tablet, Rfl: 3 .  Calcium Carb-Cholecalciferol (CALCIUM 1000 + D PO), Take by mouth., Disp: , Rfl:  .  Multiple Vitamin (MULTIVITAMIN WITH MINERALS) TABS tablet, Take 1 tablet by mouth daily., Disp: , Rfl:  .  naproxen sodium (ALEVE) 220 MG tablet, Take 220 mg 2 (two) times daily as needed by mouth., Disp: , Rfl:   Review of Systems  Constitutional: Negative for appetite change, chills, diaphoresis, fatigue, fever and unexpected weight change.  HENT: Positive for dental problem. Negative for congestion, drooling, ear pain, facial swelling, hearing loss, mouth sores, sneezing, sore throat, trouble swallowing and voice change.   Eyes: Negative for pain, discharge, redness, itching and visual disturbance.  Respiratory: Positive for cough, shortness of breath and wheezing. Negative for choking.    Cardiovascular: Negative for chest pain, palpitations and leg swelling.  Gastrointestinal: Negative for abdominal pain, blood in stool, constipation, diarrhea and vomiting.  Endocrine: Negative for cold intolerance, heat intolerance and polydipsia.  Genitourinary: Negative for decreased urine volume, dysuria and hematuria.  Musculoskeletal: Positive for arthralgias and back pain. Negative for gait problem.  Skin: Negative for rash.  Allergic/Immunologic: Positive for environmental allergies.  Neurological: Negative for seizures, syncope, light-headedness and headaches.  Hematological: Negative for adenopathy.  Psychiatric/Behavioral: Positive for dysphoric mood. Negative for agitation and suicidal ideas. The patient is nervous/anxious.     Per HPI unless specifically indicated above     Objective:    BP 110/74 (BP Location: Left Arm, Patient Position: Sitting, Cuff Size: Normal)   Pulse 76   Temp 98.1 F (36.7 C) (Other (Comment))   Ht 5\' 6"  (1.676 m)   Wt 174 lb (78.9 kg)   SpO2 96%   BMI 28.08 kg/m   Wt Readings from Last 3 Encounters:  08/23/17 174 lb (78.9 kg)  02/22/17 171 lb 8 oz (77.8 kg)  11/24/16 173 lb (78.5 kg)    Physical Exam  Constitutional: He is oriented to person, place, and time. He appears well-developed and well-nourished.  HENT:  Head: Normocephalic and atraumatic.  Neck: Neck supple.  Cardiovascular: Normal rate and regular rhythm.  Pulmonary/Chest: Effort normal and breath sounds normal. He has no wheezes.  Abdominal: Soft. Bowel sounds are normal. There is no  hepatosplenomegaly. There is no tenderness.  Musculoskeletal: He exhibits no edema.  Lymphadenopathy:    He has no cervical adenopathy.  Neurological: He is alert and oriented to person, place, and time.  Skin: Skin is warm and dry.  Psychiatric: He has a normal mood and affect. His behavior is normal.  Vitals reviewed.   Results for orders placed or performed during the hospital  encounter of 08/22/17  Lipid panel  Result Value Ref Range   Cholesterol 144 0 - 200 mg/dL   Triglycerides 64 <161<150 mg/dL   HDL 49 >09>40 mg/dL   Total CHOL/HDL Ratio 2.9 RATIO   VLDL 13 0 - 40 mg/dL   LDL Cholesterol 82 0 - 99 mg/dL  Comprehensive metabolic panel  Result Value Ref Range   Sodium 139 135 - 145 mmol/L   Potassium 4.2 3.5 - 5.1 mmol/L   Chloride 105 101 - 111 mmol/L   CO2 25 22 - 32 mmol/L   Glucose, Bld 108 (H) 65 - 99 mg/dL   BUN 20 6 - 20 mg/dL   Creatinine, Ser 6.040.67 0.61 - 1.24 mg/dL   Calcium 9.5 8.9 - 54.010.3 mg/dL   Total Protein 7.4 6.5 - 8.1 g/dL   Albumin 4.2 3.5 - 5.0 g/dL   AST 18 15 - 41 U/L   ALT 14 (L) 17 - 63 U/L   Alkaline Phosphatase 95 38 - 126 U/L   Total Bilirubin 0.5 0.3 - 1.2 mg/dL   GFR calc non Af Amer >60 >60 mL/min   GFR calc Af Amer >60 >60 mL/min   Anion gap 9 5 - 15      Assessment & Plan:   Encounter Diagnoses  Name Primary?  . Hyperlipidemia, unspecified hyperlipidemia type Yes  . Chronic obstructive pulmonary disease, unspecified COPD type (HCC)   . Cigarette nicotine dependence with nicotine-induced disorder   . Anxiety disorder, unspecified type      -reviewed labs with pt -pt to continue current medications -counseled smoking cessation -pt to follow up 6 months.  RTO sooner prn

## 2017-12-04 ENCOUNTER — Emergency Department (HOSPITAL_COMMUNITY): Payer: Self-pay

## 2017-12-04 ENCOUNTER — Emergency Department (HOSPITAL_COMMUNITY)
Admission: EM | Admit: 2017-12-04 | Discharge: 2017-12-04 | Disposition: A | Discharge status: Home / Self Care | Payer: Self-pay | Attending: Emergency Medicine | Admitting: Emergency Medicine

## 2017-12-04 ENCOUNTER — Other Ambulatory Visit: Payer: Self-pay

## 2017-12-04 ENCOUNTER — Encounter (HOSPITAL_COMMUNITY): Payer: Self-pay | Admitting: Emergency Medicine

## 2017-12-04 DIAGNOSIS — J45909 Unspecified asthma, uncomplicated: Secondary | ICD-10-CM | POA: Insufficient documentation

## 2017-12-04 DIAGNOSIS — I309 Acute pericarditis, unspecified: Secondary | ICD-10-CM

## 2017-12-04 DIAGNOSIS — I313 Pericardial effusion (noninflammatory): Secondary | ICD-10-CM

## 2017-12-04 DIAGNOSIS — I3139 Other pericardial effusion (noninflammatory): Secondary | ICD-10-CM

## 2017-12-04 DIAGNOSIS — F17210 Nicotine dependence, cigarettes, uncomplicated: Secondary | ICD-10-CM | POA: Insufficient documentation

## 2017-12-04 LAB — COMPREHENSIVE METABOLIC PANEL
ALT: 14 U/L — ABNORMAL LOW (ref 17–63)
ANION GAP: 12 (ref 5–15)
AST: 19 U/L (ref 15–41)
Albumin: 4.4 g/dL (ref 3.5–5.0)
Alkaline Phosphatase: 84 U/L (ref 38–126)
BUN: 15 mg/dL (ref 6–20)
CHLORIDE: 101 mmol/L (ref 101–111)
CO2: 23 mmol/L (ref 22–32)
Calcium: 9.6 mg/dL (ref 8.9–10.3)
Creatinine, Ser: 0.83 mg/dL (ref 0.61–1.24)
GFR calc Af Amer: >60 mL/min (ref >60)
GFR calc non Af Amer: >60 mL/min (ref >60)
GLUCOSE: 127 mg/dL — AB (ref 65–99)
POTASSIUM: 4.3 mmol/L (ref 3.5–5.1)
SODIUM: 136 mmol/L (ref 135–145)
Total Bilirubin: 0.6 mg/dL (ref 0.3–1.2)
Total Protein: 8.1 g/dL (ref 6.5–8.1)

## 2017-12-04 LAB — CBC WITH DIFFERENTIAL/PLATELET
BASOS PCT: 0 %
Basophils Absolute: 0 10*3/uL (ref 0.0–0.1)
EOS ABS: 0.2 10*3/uL (ref 0.0–0.7)
Eosinophils Relative: 1 %
HCT: 46.8 % (ref 39.0–52.0)
HEMOGLOBIN: 15.8 g/dL (ref 13.0–17.0)
Lymphocytes Relative: 14 %
Lymphs Abs: 1.9 10*3/uL (ref 0.7–4.0)
MCH: 29.7 pg (ref 26.0–34.0)
MCHC: 33.8 g/dL (ref 30.0–36.0)
MCV: 88 fL (ref 78.0–100.0)
MONOS PCT: 13 %
Monocytes Absolute: 1.7 10*3/uL — ABNORMAL HIGH (ref 0.1–1.0)
NEUTROS PCT: 72 %
Neutro Abs: 9.5 10*3/uL — ABNORMAL HIGH (ref 1.7–7.7)
Platelets: 304 10*3/uL (ref 150–400)
RBC: 5.32 MIL/uL (ref 4.22–5.81)
RDW: 13.5 % (ref 11.5–15.5)
WBC: 13.3 10*3/uL — ABNORMAL HIGH (ref 4.0–10.5)

## 2017-12-04 LAB — TROPONIN I: Troponin I: <0.03 ng/mL (ref <0.03)

## 2017-12-04 LAB — LIPASE, BLOOD: LIPASE: 22 U/L (ref 11–51)

## 2017-12-04 LAB — D-DIMER, QUANTITATIVE: D-Dimer, Quant: 0.48 ug/mL-FEU (ref 0.00–0.50)

## 2017-12-04 MED ORDER — IOPAMIDOL (ISOVUE-300) INJECTION 61%
100.0000 mL | Freq: Once | INTRAVENOUS | Status: AC | PRN
  Administered 2017-12-04: 100 mL via INTRAVENOUS

## 2017-12-04 MED ORDER — OMEPRAZOLE 20 MG PO CPDR: 20.0000 mg | DELAYED_RELEASE_CAPSULE | Freq: Every day | ORAL | 0 refills | Status: DC

## 2017-12-04 MED ORDER — HYDROMORPHONE HCL 1 MG/ML IJ SOLN
0.5000 mg | Freq: Once | INTRAMUSCULAR | Status: AC
  Administered 2017-12-04: 0.5 mg via INTRAVENOUS
  Filled 2017-12-04: qty 1

## 2017-12-04 MED ORDER — FAMOTIDINE 20 MG PO TABS
20.0000 mg | ORAL_TABLET | Freq: Once | ORAL | Status: AC
  Administered 2017-12-04: 20 mg via ORAL
  Filled 2017-12-04: qty 1

## 2017-12-04 MED ORDER — IBUPROFEN 800 MG PO TABS: 800.0000 mg | ORAL_TABLET | Freq: Three times a day (TID) | ORAL | 0 refills | Status: DC

## 2017-12-04 MED ORDER — SODIUM CHLORIDE 0.9 % IV BOLUS (SEPSIS)
1000.0000 mL | Freq: Once | INTRAVENOUS | Status: AC
  Administered 2017-12-04: 1000 mL via INTRAVENOUS

## 2017-12-04 MED ORDER — KETOROLAC TROMETHAMINE 30 MG/ML IJ SOLN
30.0000 mg | Freq: Once | INTRAMUSCULAR | Status: AC
  Administered 2017-12-04: 30 mg via INTRAVENOUS
  Filled 2017-12-04: qty 1

## 2017-12-04 MED ORDER — GI COCKTAIL ~~LOC~~
30.0000 mL | Freq: Once | ORAL | Status: AC
  Administered 2017-12-04: 30 mL via ORAL
  Filled 2017-12-04: qty 30

## 2017-12-04 NOTE — ED Triage Notes (Signed)
Pt states chest pain for couple of days. Worse yesterday and throughout the night. Pt states woke up with chest pain and sob that made him nauseated and diaphoretic.

## 2017-12-04 NOTE — ED Provider Notes (Signed)
Emergency Department Provider Note   I have reviewed the triage vital signs and the nursing notes.   HISTORY  Chief Complaint Chest Pain   HPI Jacob Reilly is a 52 y.o. male with a history of hyperlipidemia, depression, ethanol abuse, smoking, kidney stones presents to the emergency department today secondary to epigastric pain.  Patient states he has this pain off and on for the last year or so seems to be worse at night denies the last 1 or 2 days and is associated with nausea and sweatiness.  Does not noticed any relationship to certain foods or meals.  Has not seen anybody for the symptoms before.  States he does have some shortness of breath with it as well and once while will get lightheaded.  This been going on for approximately last 3 days he has not tried anything for the symptoms but this morning seem to be a lot worse than it was a couple days ago so came here for further evaluation.  Patient is never had any problems with his heart that he knows of.  No significant past history of early coronary artery disease No other associated or modifying symptoms.    Past Medical History:  Diagnosis Date  . Anxiety   . Asthma    as child-now with allergy season only.  . Depression   . ETOH abuse   . Hyperlipidemia   . Kidney calculi   . Low back pain   . Pneumonia   . Polysubstance abuse (HCC)   . PTSD (post-traumatic stress disorder)     Patient Active Problem List   Diagnosis Date Noted  . Hyperlipidemia 02/10/2016  . Low back pain 02/10/2016  . Cigarette nicotine dependence with nicotine-induced disorder 02/10/2016  . Chronic pain 01/15/2016  . Anxiety disorder 01/15/2016  . Polysubstance abuse (HCC) 04/06/2015  . Substance induced mood disorder (HCC) 04/06/2015  . Homicidal ideation   . MRSA infection 12/24/2012  . Smoker 12/24/2012  . Habitual alcohol use 12/24/2012  . Finger osteomyelitis, right (HCC) 11/26/2012    Past Surgical History:  Procedure  Laterality Date  . BACK SURGERY    . HYDROCELE EXCISION Left 08/01/2013   Procedure: HYDROCELECTOMY ADULT;  Surgeon: Ky Barban, MD;  Location: [facility];  Service: Urology;  Laterality: Left;  . I&D EXTREMITY  10/24/2012   Procedure: IRRIGATION AND DEBRIDEMENT EXTREMITY;  Surgeon: Tami Ribas, MD;  Location: [facility];  Service: Orthopedics;  Laterality: Right;  . OPEN REDUCTION INTERNAL FIXATION (ORIF) DISTAL RADIAL FRACTURE Right 05/16/2015   Procedure: OPEN REDUCTION INTERNAL FIXATION (ORIF) RIGHT DISTAL RADIUS FRACTURE;  Surgeon: Dairl Ponder, MD;  Location: [facility];  Service: Orthopedics;  Laterality: Right;  . OPEN REDUCTION INTERNAL FIXATION (ORIF) HAND Right thumb  . OPEN REDUCTION INTERNAL FIXATION (ORIF) METACARPAL Left 05/16/2015   Procedure: OPEN REDUCTION INTERNAL FIXATION OF MIDDLE PHALANGEAL FRACTURE, EXTENSOR TENDON REPAIR WITH ULNAR DIGITAL NERVE REPAIR MICROSCOPICALLY;  Surgeon: Dairl Ponder, MD;  Location: [facility];  Service: Orthopedics;  Laterality: Left;  . SHOULDER ARTHROSCOPY WITH ROTATOR CUFF REPAIR AND SUBACROMIAL DECOMPRESSION Left 06/08/2016   Procedure: LEFT SHOULDER ARTHROSCOPY WITH ROTATOR CUFF REPAIR, SUBACROMIAL DECOMPRESSION, DISTAL CLAVICLE EXCISION;  Surgeon: Tarry Kos, MD;  Location: [facility];  Service: Orthopedics;  Laterality: Left;  . THUMB FUSION Right     Current Outpatient Rx  . Order #: [id number] Class: Historical Med  . Order #: [id number] Class: Normal  . Order #: [id number] Class: Historical Med  . Order #: [id number] Class:  Print  . Order #: [id number] Class: Print    Allergies Penicillins  Family History  Problem Relation Age of Onset  . Diabetes Mother   . Hypertension Mother   . Obesity Mother   . Heart disease Mother   . Kidney disease Mother   . COPD Mother   . Cancer Father     Social History Social History   Tobacco Use  . Smoking status: Current Every Day Smoker    Packs/day: 0.25    Years: 44.00    Pack  years: 11.00    Types: Cigarettes  . Smokeless tobacco: Former Neurosurgeon    Types: Chew  Substance Use Topics  . Alcohol use: Yes    Alcohol/week: 4.8 oz    Types: 8 Cans of beer per week    Comment: hx heavy etoh august 2016  . Drug use: Yes    Types: Marijuana, Cocaine, Other-see comments    Comment: none since august 2016    Review of Systems  All other systems negative except as documented in the HPI. All pertinent positives and negatives as reviewed in the HPI. ____________________________________________   PHYSICAL EXAM:  VITAL SIGNS: ED Triage Vitals  Enc Vitals Group     BP 12/04/17 0851 123/88     Pulse Rate 12/04/17 0851 (!) 101     Resp 12/04/17 0851 20     Temp 12/04/17 0851 98.9 F (37.2 C)     Temp Source 12/04/17 0851 Oral     SpO2 12/04/17 0851 96 %     Weight 12/04/17 0850 170 lb (77.1 kg)     Height --      Head Circumference --      Peak Flow --      Pain Score 12/04/17 0849 8     Pain Loc --      Pain Edu? --      Excl. in GC? --     Constitutional: Alert and oriented. Well appearing and in no acute distress. Eyes: Conjunctivae are normal. PERRL. EOMI. Head: Atraumatic. Nose: No congestion/rhinnorhea. Mouth/Throat: Mucous membranes are moist.  Oropharynx non-erythematous. Neck: No stridor.  No meningeal signs.   Cardiovascular: Normal rate, regular rhythm. Good peripheral circulation. Grossly normal heart sounds.   Respiratory: Normal respiratory effort.  No retractions. Lungs CTAB. Gastrointestinal: Soft and nontender. No distention.  Musculoskeletal: No lower extremity tenderness nor edema. No gross deformities of extremities. Neurologic:  Normal speech and language. No gross focal neurologic deficits are appreciated.  Skin:  Skin is warm, dry and intact. No rash noted.   ____________________________________________   LABS (all labs ordered are listed, but only abnormal results are displayed)  Labs Reviewed  CBC WITH DIFFERENTIAL/PLATELET  - Abnormal; Notable for the following components:      Result Value   WBC 13.3 (*)    Neutro Abs 9.5 (*)    Monocytes Absolute 1.7 (*)    All other components within normal limits  COMPREHENSIVE METABOLIC PANEL - Abnormal; Notable for the following components:   Glucose, Bld 127 (*)    ALT 14 (*)    All other components within normal limits  D-DIMER, QUANTITATIVE (NOT AT [facility])  TROPONIN I  LIPASE, BLOOD  TROPONIN I   ____________________________________________  EKG   EKG Interpretation  Date/Time:  Monday December 04 2017 08:50:58 EST Ventricular Rate:  104 PR Interval:    QRS Duration: 77 QT Interval:  316 QTC Calculation: 416 R Axis:   71 Text Interpretation:  Sinus tachycardia  No old tracing to compare Confirmed by Marily Memos [phone]) on 12/04/2017 9:23:40 AM       ____________________________________________  RADIOLOGY  Dg Chest 2 View  Result Date: 12/04/2017 CLINICAL DATA:  Chest pain EXAM: CHEST  2 VIEW COMPARISON:  May 21, 2016 FINDINGS: There is scarring in the right base with minimal right pleural effusion. Lungs elsewhere clear. Heart size and pulmonary vascularity are normal. No adenopathy. There is mild degenerative change in the thoracic spine with mild anterior wedging of a lower thoracic vertebral body. IMPRESSION: Scarring right base with minimal right pleural effusion. Lungs elsewhere clear. Heart size within normal limits. Electronically Signed   By: Bretta Bang III M.D.   On: 12/04/2017 09:59   Ct Abdomen Pelvis W Contrast  Result Date: 12/04/2017 CLINICAL DATA:  52 year old male with chest and abdominal pain for 3 days. Shortness breath, nausea, diaphoresis, leukocytosis. EXAM: CT ABDOMEN AND PELVIS WITH CONTRAST TECHNIQUE: Multidetector CT imaging of the abdomen and pelvis was performed using the standard protocol following bolus administration of intravenous contrast. CONTRAST:  ISOVUE-300 IOPAMIDOL (ISOVUE-300) INJECTION 61%  COMPARISON:  Chest radiographs today reported separately. CT chest abdomen pelvis 12/24/2011. FINDINGS: Lower chest: Mild cardiomegaly and a small pericardial effusion are new since 2013. There may be mild associated pericardial thickening and enhancement (series 2, image 9). Negative visible distal thoracic aorta. Trace right pleural fluid or thickening appears chronic. Chronic dependent right lower lobe opacity, and mild probable dependent atelectasis in the left lung with no definite acute pulmonary opacity. Hepatobiliary: Stable and negative liver and gallbladder. Pancreas: Negative. Spleen: Negative. Adrenals/Urinary Tract: Normal adrenal glands. Bilateral renal enhancement and contrast excretion is symmetric and within normal limits. Unremarkable ureters.  Diminutive and unremarkable urinary bladder. Stomach/Bowel: Negative rectum and sigmoid colon. Mild retained stool in the descending colon, redundant splenic flexure, and throughout the transverse colon is similar to the 2013 comparison. Similar retained stool in the right colon. The cecum is mostly located in the anterior pelvis. Negative appendix. Negative terminal ileum. No dilated or abnormal small bowel. Negative stomach and duodenum. No abdominal free air or free fluid. Vascular/Lymphatic: Mild Aortoiliac calcified atherosclerosis. Major arterial structures in the abdomen and pelvis are patent. Portal venous system is patent. No lymphadenopathy. Reproductive: Negative. Other: No pelvic free fluid. Musculoskeletal: Healed posterior right 9th and 10th rib fractures since 2013. Superimposed chronic left lateral 8th rib fracture is unchanged. Stable visible spine including chronic T12 compression fracture, chronic L4 compression fracture, and chronic L3 through L5 lumbar fusion hardware. Chronic bilateral inferior pubic rami fractures. No new osseous abnormality. IMPRESSION: 1. Small pericardial effusion with possible associated pericardial thickening.  Consider Acute Pericarditis. Superimposed mild cardiomegaly is new since 2013. 2. 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 3. No acute or inflammatory process identified in the abdomen or pelvis. Electronically Signed   By: Odessa Fleming M.D.   On: 12/04/2017 12:28    ____________________________________________   PROCEDURES  Procedure(s) performed:   Procedures   ____________________________________________   INITIAL IMPRESSION / ASSESSMENT AND PLAN / ED COURSE  With intermittent nature and happen often at night seem suspicious for gastrointestinal cause however has some risk factors for coronary artery disease we will check a troponin.  His heart rate is little bit high and it does have the association with shortness of breath is history of smoking and mildly low oxygen will get a d-dimer to rule out pulmonary embolus.  We will treat for reflux at this time but will also check for biliary causes and pancreatic causes he is a drinker and smoker. Will consider CT scan if no obvious relief or causes found on labs.   S/S c/w likely pericarditis. No hemodynamic instability. Will treat for same and have PCP follow up.     Pertinent labs & imaging results that were available during my care of the patient were reviewed by me and considered in my medical decision making (see chart for details).  ____________________________________________  FINAL CLINICAL IMPRESSION(S) / ED DIAGNOSES  Final diagnoses:  Acute pericarditis, unspecified type  Pericardial effusion     MEDICATIONS GIVEN DURING THIS VISIT:  Medications  gi cocktail (Maalox,Lidocaine,Donnatal) (30 mLs Oral Given 12/04/17 0938)  famotidine (PEPCID) tablet 20 mg (20 mg Oral Given 12/04/17 0938)  sodium chloride 0.9 % bolus 1,000 mL (0 mLs Intravenous Stopped 12/04/17 1101)  iopamidol (ISOVUE-300) 61 % injection 100 mL (100 mLs Intravenous Contrast  Given 12/04/17 1209)  HYDROmorphone (DILAUDID) injection 0.5 mg (0.5 mg Intravenous Given 12/04/17 1234)  ketorolac (TORADOL) 30 MG/ML injection 30 mg (30 mg Intravenous Given 12/04/17 1321)     NEW OUTPATIENT MEDICATIONS STARTED DURING THIS VISIT:  Discharge Medication List as of 12/04/2017  2:15 PM    START taking these medications   Details  ibuprofen (ADVIL,MOTRIN) 800 MG tablet Take 1 tablet (800 mg total) by mouth 3 (three) times daily., Starting Mon 12/04/2017, Print    omeprazole (PRILOSEC) 20 MG capsule Take 1 capsule (20 mg total) by mouth daily., Starting Mon 12/04/2017, Print        Note:  This note was prepared with assistance of Dragon voice recognition software. Occasional wrong-word or sound-a-like substitutions may have occurred due to the inherent limitations of voice recognition software.   Marily MemosMesner, Melanni Benway, MD 12/04/17 [phone]

## 2017-12-27 ENCOUNTER — Ambulatory Visit: Payer: Worker's Compensation | Admitting: Physician Assistant

## 2017-12-27 ENCOUNTER — Ambulatory Visit (HOSPITAL_COMMUNITY)
Admission: RE | Admit: 2017-12-27 | Discharge: 2017-12-27 | Disposition: A | Discharge status: Home / Self Care | Payer: Self-pay | Source: Ambulatory Visit | Attending: Physician Assistant | Admitting: Physician Assistant

## 2017-12-27 ENCOUNTER — Encounter: Payer: Self-pay | Admitting: Physician Assistant

## 2017-12-27 ENCOUNTER — Other Ambulatory Visit (HOSPITAL_COMMUNITY)
Admission: RE | Admit: 2017-12-27 | Discharge: 2017-12-27 | Disposition: A | Discharge status: Home / Self Care | Payer: Self-pay | Source: Ambulatory Visit | Attending: Physician Assistant | Admitting: Physician Assistant

## 2017-12-27 VITALS — BP 108/70 | HR 65 | Temp 97.9°F | Ht 66.0 in | Wt 181.5 lb

## 2017-12-27 DIAGNOSIS — Z125 Encounter for screening for malignant neoplasm of prostate: Secondary | ICD-10-CM

## 2017-12-27 DIAGNOSIS — D72829 Elevated white blood cell count, unspecified: Secondary | ICD-10-CM | POA: Insufficient documentation

## 2017-12-27 DIAGNOSIS — I319 Disease of pericardium, unspecified: Secondary | ICD-10-CM

## 2017-12-27 DIAGNOSIS — I517 Cardiomegaly: Secondary | ICD-10-CM | POA: Insufficient documentation

## 2017-12-27 DIAGNOSIS — R918 Other nonspecific abnormal finding of lung field: Secondary | ICD-10-CM | POA: Insufficient documentation

## 2017-12-27 DIAGNOSIS — R079 Chest pain, unspecified: Secondary | ICD-10-CM | POA: Insufficient documentation

## 2017-12-27 LAB — PSA: PROSTATIC SPECIFIC ANTIGEN: 0.58 ng/mL (ref 0.00–4.00)

## 2017-12-27 LAB — CBC WITH DIFFERENTIAL/PLATELET
BASOS ABS: 0 10*3/uL (ref 0.0–0.1)
BASOS PCT: 0 %
EOS PCT: 2 %
Eosinophils Absolute: 0.2 10*3/uL (ref 0.0–0.7)
HCT: 41.4 % (ref 39.0–52.0)
Hemoglobin: 13.5 g/dL (ref 13.0–17.0)
Lymphocytes Relative: 14 %
Lymphs Abs: 1.8 10*3/uL (ref 0.7–4.0)
MCH: 29.2 pg (ref 26.0–34.0)
MCHC: 32.6 g/dL (ref 30.0–36.0)
MCV: 89.4 fL (ref 78.0–100.0)
MONO ABS: 1.7 10*3/uL — AB (ref 0.1–1.0)
Monocytes Relative: 13 %
Neutro Abs: 9.5 10*3/uL — ABNORMAL HIGH (ref 1.7–7.7)
Neutrophils Relative %: 71 %
PLATELETS: 305 10*3/uL (ref 150–400)
RBC: 4.63 MIL/uL (ref 4.22–5.81)
RDW: 14.1 % (ref 11.5–15.5)
WBC: 13.3 10*3/uL — AB (ref 4.0–10.5)

## 2017-12-27 LAB — COMPREHENSIVE METABOLIC PANEL
ALBUMIN: 3.9 g/dL (ref 3.5–5.0)
ALK PHOS: 83 U/L (ref 38–126)
ALT: 18 U/L (ref 17–63)
ANION GAP: 9 (ref 5–15)
AST: 19 U/L (ref 15–41)
BILIRUBIN TOTAL: 1 mg/dL (ref 0.3–1.2)
BUN: 14 mg/dL (ref 6–20)
CALCIUM: 9.5 mg/dL (ref 8.9–10.3)
CO2: 25 mmol/L (ref 22–32)
Chloride: 103 mmol/L (ref 101–111)
Creatinine, Ser: 0.72 mg/dL (ref 0.61–1.24)
GFR calc Af Amer: >60 mL/min (ref >60)
GLUCOSE: 101 mg/dL — AB (ref 65–99)
POTASSIUM: 4.6 mmol/L (ref 3.5–5.1)
Sodium: 137 mmol/L (ref 135–145)
TOTAL PROTEIN: 7.5 g/dL (ref 6.5–8.1)

## 2017-12-27 NOTE — Progress Notes (Signed)
BP 108/70 (BP Location: Left Arm, Patient Position: Sitting, Cuff Size: Normal)   Pulse 65   Temp 97.9 F (36.6 C) (Other (Comment))   Ht 5\' 6"  (1.676 m)   Wt 181 lb 8 oz (82.3 kg)   SpO2 99%   BMI 29.29 kg/m    Subjective:    Patient ID: Jacob Reilly, male    DOB: 1966-09-07, 52 y.o.   MRN: [id number]  HPI: Jacob Reilly is a 52 y.o. male presenting on 12/27/2017 for Follow-up (from hospital, was dx with pericarditis, and continues to have episodes of cp, especially when reclines or takes a deep breath, is having sternal pain now)   HPI  Chief Complaint  Patient presents with  . Follow-up    from hospital, was dx with pericarditis, and continues to have episodes of cp, especially when reclines or takes a deep breath, is having sternal pain now    Pt states today is better than when he went to the ER on 2/18.  2 nights ago he was worse- was hurting a lot  He is taking the IBU that he was given in the ER and says it helps some.  He didn't get the omeprazole because he couldn't afford it.   He says breathing anything more than a shallow breath hurts.   He is still smoking some  He is eating okay.  He says food doesn't have any effect, better or worse, on his pain.    Relevant past medical, surgical, family and social history reviewed and updated as indicated. Interim medical history since our last visit reviewed. Allergies and medications reviewed and updated.   Current Outpatient Medications:  .  albuterol (PROVENTIL HFA;VENTOLIN HFA) 108 (90 BASE) MCG/ACT inhaler, Inhale 2 puffs into the lungs every 6 (six) hours as needed for wheezing or shortness of breath., Disp: , Rfl:  .  atorvastatin (LIPITOR) 20 MG tablet, TAKE 1 Tablet BY MOUTH ONCE DAILY, Disp: 90 tablet, Rfl: 3 .  Calcium Carb-Cholecalciferol (CALCIUM 1000 + D PO), Take 2 tablets by mouth daily. , Disp: , Rfl:  .  ibuprofen (ADVIL,MOTRIN) 800 MG tablet, Take 1 tablet (800 mg total) by mouth 3 (three) times  daily., Disp: 42 tablet, Rfl: 0 .  omeprazole (PRILOSEC) 20 MG capsule, Take 1 capsule (20 mg total) by mouth daily. (Patient not taking: Reported on 12/27/2017), Disp: 30 capsule, Rfl: 0   Review of Systems  Constitutional: Positive for chills, diaphoresis and fatigue. Negative for appetite change, fever and unexpected weight change.  HENT: Positive for sneezing and trouble swallowing. Negative for congestion, drooling, ear pain, facial swelling, hearing loss, mouth sores, sore throat and voice change.   Eyes: Negative for pain, discharge, redness, itching and visual disturbance.  Respiratory: Positive for cough, shortness of breath and wheezing. Negative for choking.   Cardiovascular: Positive for chest pain and palpitations. Negative for leg swelling.  Gastrointestinal: Negative for abdominal pain, blood in stool, constipation, diarrhea and vomiting.  Endocrine: Negative for cold intolerance, heat intolerance and polydipsia.  Genitourinary: Negative for decreased urine volume, dysuria and hematuria.  Musculoskeletal: Positive for arthralgias and back pain. Negative for gait problem.  Skin: Negative for rash.  Allergic/Immunologic: Negative for environmental allergies.  Neurological: Positive for light-headedness. Negative for seizures, syncope and headaches.  Hematological: Negative for adenopathy.  Psychiatric/Behavioral: Positive for agitation and dysphoric mood. Negative for suicidal ideas. The patient is nervous/anxious.     Per HPI unless specifically indicated above     Objective:  BP 108/70 (BP Location: Left Arm, Patient Position: Sitting, Cuff Size: Normal)   Pulse 65   Temp 97.9 F (36.6 C) (Other (Comment))   Ht 5\' 6"  (1.676 m)   Wt 181 lb 8 oz (82.3 kg)   SpO2 99%   BMI 29.29 kg/m   Wt Readings from Last 3 Encounters:  12/27/17 181 lb 8 oz (82.3 kg)  12/04/17 170 lb (77.1 kg)  08/23/17 174 lb (78.9 kg)    Physical Exam  Constitutional: He is oriented to  person, place, and time. He appears well-developed and well-nourished.  HENT:  Head: Normocephalic and atraumatic.  Neck: Neck supple.  Cardiovascular: Normal rate, regular rhythm and normal heart sounds.  No murmur heard. Pulmonary/Chest: Effort normal and breath sounds normal. He has no wheezes.  Abdominal: Soft. Bowel sounds are normal. There is no hepatosplenomegaly. There is no tenderness.  Musculoskeletal: He exhibits no edema.  Lymphadenopathy:    He has no cervical adenopathy.  Neurological: He is alert and oriented to person, place, and time.  Skin: Skin is warm and dry.  Psychiatric: He has a normal mood and affect. His behavior is normal.  Vitals reviewed.        Assessment & Plan:   Encounter Diagnoses  Name Primary?  . Pericarditis, unspecified chronicity, unspecified type Yes  . Chest pain, unspecified type   . Leukocytosis, unspecified type     -pt to get Echo Friday at 930 -check cxr and labs today -Follow up OV next week.  Go to ER if worsens or new symptoms

## 2017-12-29 ENCOUNTER — Ambulatory Visit (HOSPITAL_COMMUNITY)
Admission: RE | Admit: 2017-12-29 | Discharge: 2017-12-29 | Disposition: A | Discharge status: Home / Self Care | Payer: Self-pay | Source: Ambulatory Visit | Attending: Physician Assistant | Admitting: Physician Assistant

## 2017-12-29 DIAGNOSIS — Z720 Tobacco use: Secondary | ICD-10-CM | POA: Insufficient documentation

## 2017-12-29 DIAGNOSIS — R079 Chest pain, unspecified: Secondary | ICD-10-CM | POA: Insufficient documentation

## 2017-12-29 DIAGNOSIS — I319 Disease of pericardium, unspecified: Secondary | ICD-10-CM | POA: Insufficient documentation

## 2017-12-29 DIAGNOSIS — I071 Rheumatic tricuspid insufficiency: Secondary | ICD-10-CM | POA: Insufficient documentation

## 2017-12-29 DIAGNOSIS — I313 Pericardial effusion (noninflammatory): Secondary | ICD-10-CM | POA: Insufficient documentation

## 2017-12-29 DIAGNOSIS — E785 Hyperlipidemia, unspecified: Secondary | ICD-10-CM | POA: Insufficient documentation

## 2017-12-29 NOTE — Progress Notes (Signed)
*  PRELIMINARY RESULTS* Echocardiogram 2D Echocardiogram has been performed.  Jacob Reilly, Jacob Reilly 12/29/2017, 10:06 AM

## 2017-12-30 ENCOUNTER — Other Ambulatory Visit: Payer: Self-pay | Admitting: Physician Assistant

## 2017-12-30 DIAGNOSIS — I319 Disease of pericardium, unspecified: Secondary | ICD-10-CM

## 2017-12-30 DIAGNOSIS — R931 Abnormal findings on diagnostic imaging of heart and coronary circulation: Secondary | ICD-10-CM

## 2017-12-30 DIAGNOSIS — R079 Chest pain, unspecified: Secondary | ICD-10-CM

## 2018-01-01 ENCOUNTER — Ambulatory Visit: Payer: Worker's Compensation | Admitting: Physician Assistant

## 2018-01-01 ENCOUNTER — Encounter: Payer: Self-pay | Admitting: Physician Assistant

## 2018-01-01 VITALS — BP 122/71 | HR 97 | Temp 98.1°F | Wt 182.0 lb

## 2018-01-01 DIAGNOSIS — R079 Chest pain, unspecified: Secondary | ICD-10-CM

## 2018-01-01 DIAGNOSIS — F17219 Nicotine dependence, cigarettes, with unspecified nicotine-induced disorders: Secondary | ICD-10-CM

## 2018-01-01 DIAGNOSIS — I319 Disease of pericardium, unspecified: Secondary | ICD-10-CM

## 2018-01-01 NOTE — Progress Notes (Signed)
BP 122/71 (BP Location: Right Arm, Patient Position: Sitting, Cuff Size: Normal)   Pulse 97   Temp 98.1 F (36.7 C)   Wt 182 lb (82.6 kg)   SpO2 99%   BMI 29.38 kg/m    Subjective:    Patient ID: Jacob Reilly, male    DOB: 04/13/1966, 52 y.o.   MRN: [id number]  HPI: Jacob BatheMonte Winkleman is a 52 y.o. male presenting on 01/01/2018 for Follow-up   HPI  Pt is seen today for follow up of pericarditis  Pt says he is feeling a bit better.  Says he can take a deep breath sometimes now  Pt says he is waiting on one piece of paper to get his [employer] application turned in .  Pt still smokes "a little bit"  Relevant past medical, surgical, family and social history reviewed and updated as indicated. Interim medical history since our last visit reviewed. Allergies and medications reviewed and updated.   Current Outpatient Medications:  .  albuterol (PROVENTIL HFA;VENTOLIN HFA) 108 (90 BASE) MCG/ACT inhaler, Inhale 2 puffs into the lungs every 6 (six) hours as needed for wheezing or shortness of breath., Disp: , Rfl:  .  atorvastatin (LIPITOR) 20 MG tablet, TAKE 1 Tablet BY MOUTH ONCE DAILY, Disp: 90 tablet, Rfl: 3 .  Calcium Carb-Cholecalciferol (CALCIUM 1000 + D PO), Take 2 tablets by mouth daily. , Disp: , Rfl:  .  ibuprofen (ADVIL,MOTRIN) 800 MG tablet, Take 1 tablet (800 mg total) by mouth 3 (three) times daily., Disp: 42 tablet, Rfl: 0 .  omeprazole (PRILOSEC) 20 MG capsule, Take 1 capsule (20 mg total) by mouth daily. (Patient not taking: Reported on 01/01/2018), Disp: 30 capsule, Rfl: 0   Review of Systems  Constitutional: Positive for diaphoresis and fatigue. Negative for appetite change, chills, fever and unexpected weight change.  HENT: Positive for congestion and trouble swallowing. Negative for dental problem, drooling, ear pain, facial swelling, hearing loss, mouth sores, sneezing, sore throat and voice change.   Eyes: Negative for pain, discharge, redness, itching and  visual disturbance.  Respiratory: Positive for cough, shortness of breath and wheezing. Negative for choking.   Cardiovascular: Positive for chest pain and palpitations. Negative for leg swelling.  Gastrointestinal: Negative for abdominal pain, blood in stool, constipation, diarrhea and vomiting.  Endocrine: Negative for cold intolerance, heat intolerance and polydipsia.  Genitourinary: Negative for decreased urine volume, dysuria and hematuria.  Musculoskeletal: Positive for arthralgias, back pain and gait problem.  Skin: Negative for rash.  Allergic/Immunologic: Positive for environmental allergies.  Neurological: Positive for light-headedness. Negative for seizures, syncope and headaches.  Hematological: Negative for adenopathy.  Psychiatric/Behavioral: Positive for agitation and dysphoric mood. Negative for suicidal ideas. The patient is nervous/anxious.     Per HPI unless specifically indicated above     Objective:    BP 122/71 (BP Location: Right Arm, Patient Position: Sitting, Cuff Size: Normal)   Pulse 97   Temp 98.1 F (36.7 C)   Wt 182 lb (82.6 kg)   SpO2 99%   BMI 29.38 kg/m   Wt Readings from Last 3 Encounters:  01/01/18 182 lb (82.6 kg)  12/27/17 181 lb 8 oz (82.3 kg)  12/04/17 170 lb (77.1 kg)    Physical Exam  Constitutional: He is oriented to person, place, and time. He appears well-developed and well-nourished.  HENT:  Head: Normocephalic and atraumatic.  Neck: Neck supple.  Cardiovascular: Normal rate and regular rhythm. Exam reveals no friction rub.  No murmur heard.  Pulmonary/Chest: Effort normal. No tachypnea and no bradypnea. No respiratory distress. He has no wheezes. He has no rhonchi. He has rales (R base).  r base crackles  Abdominal: Soft. Bowel sounds are normal. There is no hepatosplenomegaly. There is no tenderness.  Musculoskeletal: He exhibits no edema.  Lymphadenopathy:    He has no cervical adenopathy.  Neurological: He is alert and  oriented to person, place, and time.  Skin: Skin is warm and dry.  Psychiatric: He has a normal mood and affect. His behavior is normal.  Vitals reviewed.   Results for orders placed or performed during the hospital encounter of 12/27/17  Comprehensive metabolic panel  Result Value Ref Range   Sodium 137 135 - 145 mmol/L   Potassium 4.6 3.5 - 5.1 mmol/L   Chloride 103 101 - 111 mmol/L   CO2 25 22 - 32 mmol/L   Glucose, Bld 101 (H) 65 - 99 mg/dL   BUN 14 6 - 20 mg/dL   Creatinine, Ser 4.09 0.61 - 1.24 mg/dL   Calcium 9.5 8.9 - 81.1 mg/dL   Total Protein 7.5 6.5 - 8.1 g/dL   Albumin 3.9 3.5 - 5.0 g/dL   AST 19 15 - 41 U/L   ALT 18 17 - 63 U/L   Alkaline Phosphatase 83 38 - 126 U/L   Total Bilirubin 1.0 0.3 - 1.2 mg/dL   GFR calc non Af Amer >60 >60 mL/min   GFR calc Af Amer >60 >60 mL/min   Anion gap 9 5 - 15  CBC w/Diff/Platelet  Result Value Ref Range   WBC 13.3 (H) 4.0 - 10.5 K/uL   RBC 4.63 4.22 - 5.81 MIL/uL   Hemoglobin 13.5 13.0 - 17.0 g/dL   HCT 91.4 78.2 - 95.6 %   MCV 89.4 78.0 - 100.0 fL   MCH 29.2 26.0 - 34.0 pg   MCHC 32.6 30.0 - 36.0 g/dL   RDW 21.3 08.6 - 57.8 %   Platelets 305 150 - 400 K/uL   Neutrophils Relative % 71 %   Neutro Abs 9.5 (H) 1.7 - 7.7 K/uL   Lymphocytes Relative 14 %   Lymphs Abs 1.8 0.7 - 4.0 K/uL   Monocytes Relative 13 %   Monocytes Absolute 1.7 (H) 0.1 - 1.0 K/uL   Eosinophils Relative 2 %   Eosinophils Absolute 0.2 0.0 - 0.7 K/uL   Basophils Relative 0 %   Basophils Absolute 0.0 0.0 - 0.1 K/uL  PSA  Result Value Ref Range   Prostatic Specific Antigen 0.58 0.00 - 4.00 ng/mL      Assessment & Plan:   Encounter Diagnoses  Name Primary?  . Pericarditis, unspecified chronicity, unspecified type Yes  . Chest pain, unspecified type   . Cigarette nicotine dependence with nicotine-induced disorder     -Reviewed labs, cxr and echo with pt -pt to continue ibuprofen prn -counseled smoking cessation -will refer to cardiology for  further evaluation.  Discussed with pt that echo report recommends repeat echo.  Will have cardiology determine timing for that -pt to follow up here 1 week.  Pt to RTO sooner or go to ER if needed for sob, cp

## 2018-01-08 ENCOUNTER — Ambulatory Visit: Payer: Worker's Compensation | Admitting: Physician Assistant

## 2018-01-15 ENCOUNTER — Encounter: Payer: Self-pay | Admitting: Physician Assistant

## 2018-01-29 ENCOUNTER — Encounter: Payer: Self-pay | Admitting: Cardiovascular Disease

## 2018-01-29 ENCOUNTER — Ambulatory Visit (INDEPENDENT_AMBULATORY_CARE_PROVIDER_SITE_OTHER): Payer: Self-pay | Admitting: Cardiovascular Disease

## 2018-01-29 VITALS — BP 122/78 | HR 68 | Ht 67.0 in | Wt 183.0 lb

## 2018-01-29 DIAGNOSIS — I300 Acute nonspecific idiopathic pericarditis: Secondary | ICD-10-CM

## 2018-01-29 DIAGNOSIS — I3139 Other pericardial effusion (noninflammatory): Secondary | ICD-10-CM

## 2018-01-29 DIAGNOSIS — Z9289 Personal history of other medical treatment: Secondary | ICD-10-CM

## 2018-01-29 DIAGNOSIS — I313 Pericardial effusion (noninflammatory): Secondary | ICD-10-CM

## 2018-01-29 MED ORDER — COLCHICINE 0.6 MG PO TABS: 0.6000 mg | ORAL_TABLET | Freq: Every day | ORAL | 0 refills | Status: DC

## 2018-01-29 MED ORDER — COLCHICINE 0.6 MG PO TABS: 0.6000 mg | ORAL_TABLET | Freq: Two times a day (BID) | ORAL | 0 refills | Status: DC

## 2018-01-29 NOTE — Patient Instructions (Addendum)
Your physician wants you to follow-up in: 6 weeks  with Dr.Koneswaran     Your physician has requested that you have a LIMITED  echocardiogram. Echocardiography is a painless test that uses sound waves to create images of your heart. It provides your doctor with information about the size and shape of your heart and how well your heart's chambers and valves are working. This procedure takes approximately one hour. There are no restrictions for this procedure.    START Colchicine 0.6 mg twice a day for three months   No lab work ordered today.     Thank you for choosing [facility] !

## 2018-01-29 NOTE — Progress Notes (Signed)
CARDIOLOGY CONSULT NOTE  Patient ID: Jacob Reilly MRN: [id number] DOB/AGE: May 26, 1966 52 y.o.  Admit date: (Not on file) Primary Physician: [name], PA-C Referring Physician: [name], PA-C  Reason for Consultation: Pericarditis  HPI: Jacob Reilly is a 52 y.o. male who is being seen today for the evaluation of pericarditis at the request of [name], PA-C.   He was evaluated for chest pain in the ED on 12/04/17 and was diagnosed with pericarditis at that time.  I reviewed all relevant documentation, labs, and studies.  Chest x-ray showed scarring at the right base with minimal right pleural effusion.  CT of the abdomen and pelvis demonstrated mild cardiomegaly and a small pericardial effusion which was new since 2013.  It also commented that there may be mild associated pericardial thickening and enhancement and to consider acute pericarditis.  He was given ibuprofen.  Echocardiogram 12/29/17 showed normal left ventricular systolic function and regional wall motion, LVEF 55-60%, mild LVH, trivial mitral regurgitation, mild tricuspid regurgitation, and a moderate pericardial effusion with mild undulation in the right free wall but no obvious right ventricular compromise.  There was equivocal respiratory variation and mitral outflow which was not seen consistently.  I personally reviewed the ECG performed on 12/04/17 which showed sinus tachycardia and minimal precordial PR depression.  I personally reviewed the ECG performed on 12/27/17 which demonstrated sinus rhythm.  He said he has some chest tightness with deep breaths.  He also has hiccups.  He denies orthopnea and paroxysmal nocturnal dyspnea.  He said overall all of his symptoms have improved since being in the ED in February.  He also told me he has been experiencing intermittent chest pains over the past year and a half and they primarily occur with deep inspiration.  He also has anxiety and  depression and has also wondered if some of his symptoms were due to this.    Allergies  Allergen Reactions  . Penicillins Other (See Comments)    Childhood Allergy.  Has patient had a PCN reaction causing immediate rash, facial/tongue/throat swelling, SOB or lightheadedness with hypotension: unknown Has patient had a PCN reaction causing severe rash involving mucus membranes or skin necrosis: unknown Has patient had a PCN reaction that required hospitalization unknown Has patient had a PCN reaction occurring within the last 10 years: unknown If all of the above answers are "NO", then may proceed with Cephalosporin use.     Current Outpatient Medications  Medication Sig Dispense Refill  . albuterol (PROVENTIL HFA;VENTOLIN HFA) 108 (90 BASE) MCG/ACT inhaler Inhale 2 puffs into the lungs every 6 (six) hours as needed for wheezing or shortness of breath.    Marland Kitchen atorvastatin (LIPITOR) 20 MG tablet TAKE 1 Tablet BY MOUTH ONCE DAILY 90 tablet 3  . Calcium Carb-Cholecalciferol (CALCIUM 1000 + D PO) Take 2 tablets by mouth daily.     Marland Kitchen ibuprofen (ADVIL,MOTRIN) 800 MG tablet Take 1 tablet (800 mg total) by mouth 3 (three) times daily. 42 tablet 0   No current facility-administered medications for this visit.     Past Medical History:  Diagnosis Date  . Anxiety   . Asthma    as child-now with allergy season only.  . Depression   . ETOH abuse   . Hyperlipidemia   . Kidney calculi   . Low back pain   . Pneumonia   . Polysubstance abuse (HCC)   . PTSD (post-traumatic stress disorder)     Past Surgical History:  Procedure Laterality Date  . BACK SURGERY    . HYDROCELE EXCISION Left 08/01/2013   Procedure: HYDROCELECTOMY ADULT;  Surgeon: Ky BarbanMohammad I Javaid, MD;  Location: [facility];  Service: Urology;  Laterality: Left;  . I&D EXTREMITY  10/24/2012   Procedure: IRRIGATION AND DEBRIDEMENT EXTREMITY;  Surgeon: Tami RibasKevin R Kuzma, MD;  Location: [facility];  Service: Orthopedics;  Laterality: Right;  .  OPEN REDUCTION INTERNAL FIXATION (ORIF) DISTAL RADIAL FRACTURE Right 05/16/2015   Procedure: OPEN REDUCTION INTERNAL FIXATION (ORIF) RIGHT DISTAL RADIUS FRACTURE;  Surgeon: Dairl PonderMatthew Weingold, MD;  Location: [facility];  Service: Orthopedics;  Laterality: Right;  . OPEN REDUCTION INTERNAL FIXATION (ORIF) HAND Right thumb  . OPEN REDUCTION INTERNAL FIXATION (ORIF) METACARPAL Left 05/16/2015   Procedure: OPEN REDUCTION INTERNAL FIXATION OF MIDDLE PHALANGEAL FRACTURE, EXTENSOR TENDON REPAIR WITH ULNAR DIGITAL NERVE REPAIR MICROSCOPICALLY;  Surgeon: Dairl PonderMatthew Weingold, MD;  Location: [facility];  Service: Orthopedics;  Laterality: Left;  . SHOULDER ARTHROSCOPY WITH ROTATOR CUFF REPAIR AND SUBACROMIAL DECOMPRESSION Left 06/08/2016   Procedure: LEFT SHOULDER ARTHROSCOPY WITH ROTATOR CUFF REPAIR, SUBACROMIAL DECOMPRESSION, DISTAL CLAVICLE EXCISION;  Surgeon: Tarry KosNaiping M Xu, MD;  Location:  SUR[facility]ERY CENTER;  Service: Orthopedics;  Laterality: Left;  . THUMB FUSION Right     Social History   Socioeconomic History  . Marital status: Divorced    Spouse name: Not on file  . Number of children: Not on file  . Years of education: Not on file  . Highest education level: Not on file  Occupational History  . Not on file  Social Needs  . Financial resource strain: Not on file  . Food insecurity:    Worry: Not on file    Inability: Not on file  . Transportation needs:    Medical: Not on file    Non-medical: Not on file  Tobacco Use  . Smoking status: Former Smoker    Packs/day: 0.25    Years: 44.00    Pack years: 11.00    Types: Cigarettes    Start date: 01/29/1973    Last attempt to quit: 01/15/2018    Years since quitting: 0.0  . Smokeless tobacco: Former NeurosurgeonUser    Types: Chew  . Tobacco comment: has cut down to 3-4 cigarettes a day since hospitalized  Substance and Sexual Activity  . Alcohol use: Yes    Alcohol/week: 4.8 oz    Types: 8 Cans of beer per week    Comment: hx heavy etoh august 2016  . Drug  use: Yes    Types: Marijuana, Cocaine, Other-see comments    Comment: none since august 2016  . Sexual activity: Yes    Birth control/protection: None  Lifestyle  . Physical activity:    Days per week: Not on file    Minutes per session: Not on file  . Stress: Not on file  Relationships  . Social connections:    Talks on phone: Not on file    Gets together: Not on file    Attends religious service: Not on file    Active member of club or organization: Not on file    Attends meetings of clubs or organizations: Not on file    Relationship status: Not on file  . Intimate partner violence:    Fear of current or ex partner: Not on file    Emotionally abused: Not on file    Physically abused: Not on file    Forced sexual activity: Not on file  Other Topics Concern  . Not  on file  Social History Narrative  . Not on file     No family history of premature CAD in 1st degree relatives.  Current Meds  Medication Sig  . albuterol (PROVENTIL HFA;VENTOLIN HFA) 108 (90 BASE) MCG/ACT inhaler Inhale 2 puffs into the lungs every 6 (six) hours as needed for wheezing or shortness of breath.  Marland Kitchen atorvastatin (LIPITOR) 20 MG tablet TAKE 1 Tablet BY MOUTH ONCE DAILY  . Calcium Carb-Cholecalciferol (CALCIUM 1000 + D PO) Take 2 tablets by mouth daily.   Marland Kitchen ibuprofen (ADVIL,MOTRIN) 800 MG tablet Take 1 tablet (800 mg total) by mouth 3 (three) times daily.      Review of systems complete and found to be negative unless listed above in HPI    Physical exam Blood pressure 122/78, pulse 68, height 5\' 7"  (1.702 m), weight 183 lb (83 kg), SpO2 96 %. General: NAD Neck: No JVD, no thyromegaly or thyroid nodule.  Lungs: Clear to auscultation bilaterally with normal respiratory effort. CV: Nondisplaced PMI. Regular rate and rhythm, normal S1/S2, no S3/S4, no murmur.  No peripheral edema.  No carotid bruit.  Abdomen: Soft, nontender, no distention.  Skin: Intact without lesions or rashes.  Neurologic:  Alert and oriented x 3.  Psych: Normal affect. Extremities: No clubbing or cyanosis.  HEENT: Normal.   ECG: Most recent ECG reviewed.   Labs: Lab Results  Component Value Date/Time   K 4.6 12/27/2017 12:32 PM   BUN 14 12/27/2017 12:32 PM   CREATININE 0.72 12/27/2017 12:32 PM   CREATININE 0.83 11/18/2016 08:09 AM   ALT 18 12/27/2017 12:32 PM   HGB 13.5 12/27/2017 12:32 PM     Lipids: Lab Results  Component Value Date/Time   LDLCALC 82 08/22/2017 08:05 AM   CHOL 144 08/22/2017 08:05 AM   TRIG 64 08/22/2017 08:05 AM   HDL 49 08/22/2017 08:05 AM        ASSESSMENT AND PLAN:   1.  Acute pericarditis with pleuritic chest pain and episodic shortness of breath: He is currently taking ibuprofen and sometimes only 400 mg in a day.  I will start colchicine 0.6 mg twice daily and treat him for 3 months.  2.  Pericardial effusion: I will obtain a limited echocardiogram to assess for a change in size.    Disposition: Follow up in 6 weeks.   Signed: Prentice Docker, M.D., F.A.C.C.  01/29/2018, 1:58 PM

## 2018-02-02 ENCOUNTER — Ambulatory Visit (HOSPITAL_COMMUNITY)
Admission: RE | Admit: 2018-02-02 | Discharge: 2018-02-02 | Disposition: A | Discharge status: Home / Self Care | Payer: Self-pay | Source: Ambulatory Visit | Attending: Cardiovascular Disease | Admitting: Cardiovascular Disease

## 2018-02-02 DIAGNOSIS — Z87891 Personal history of nicotine dependence: Secondary | ICD-10-CM | POA: Insufficient documentation

## 2018-02-02 DIAGNOSIS — E785 Hyperlipidemia, unspecified: Secondary | ICD-10-CM | POA: Insufficient documentation

## 2018-02-02 DIAGNOSIS — F4310 Post-traumatic stress disorder, unspecified: Secondary | ICD-10-CM | POA: Insufficient documentation

## 2018-02-02 DIAGNOSIS — I313 Pericardial effusion (noninflammatory): Secondary | ICD-10-CM | POA: Insufficient documentation

## 2018-02-02 DIAGNOSIS — I3139 Other pericardial effusion (noninflammatory): Secondary | ICD-10-CM

## 2018-02-02 NOTE — Progress Notes (Signed)
*  PRELIMINARY RESULTS* Echocardiogram 2D Echocardiogram LIMITED  has been performed.  Jeryl Columbialliott, Arrionna Serena 02/02/2018, 11:01 AM

## 2018-02-06 ENCOUNTER — Telehealth: Payer: Self-pay | Admitting: *Deleted

## 2018-02-06 NOTE — Telephone Encounter (Signed)
-----   Message from Albertine PatriciaStaci T Ashworth, CMA sent at 02/05/2018 12:11 PM EDT -----   ----- Message ----- From: Laqueta LindenKoneswaran, Suresh A, MD Sent: 02/05/2018   8:40 AM To: Norva PavlovKailey Joyce, LPN  Normal pumping function.  No fluid collection around the heart.

## 2018-02-06 NOTE — Telephone Encounter (Signed)
Pt aware and voiced understanding - routed to pcp  

## 2018-02-14 ENCOUNTER — Other Ambulatory Visit (HOSPITAL_COMMUNITY)
Admission: RE | Admit: 2018-02-14 | Discharge: 2018-02-14 | Disposition: A | Discharge status: Home / Self Care | Payer: Self-pay | Source: Ambulatory Visit | Attending: Physician Assistant | Admitting: Physician Assistant

## 2018-02-14 DIAGNOSIS — E785 Hyperlipidemia, unspecified: Secondary | ICD-10-CM | POA: Insufficient documentation

## 2018-02-14 LAB — COMPREHENSIVE METABOLIC PANEL
ALK PHOS: 89 U/L (ref 38–126)
ALT: 24 U/L (ref 17–63)
AST: 20 U/L (ref 15–41)
Albumin: 4.2 g/dL (ref 3.5–5.0)
Anion gap: 9 (ref 5–15)
BILIRUBIN TOTAL: 0.9 mg/dL (ref 0.3–1.2)
BUN: 22 mg/dL — AB (ref 6–20)
CALCIUM: 9.7 mg/dL (ref 8.9–10.3)
CHLORIDE: 106 mmol/L (ref 101–111)
CO2: 24 mmol/L (ref 22–32)
CREATININE: 0.75 mg/dL (ref 0.61–1.24)
Glucose, Bld: 100 mg/dL — ABNORMAL HIGH (ref 65–99)
Potassium: 4.3 mmol/L (ref 3.5–5.1)
Sodium: 139 mmol/L (ref 135–145)
Total Protein: 7 g/dL (ref 6.5–8.1)

## 2018-02-14 LAB — LIPID PANEL
CHOLESTEROL: 135 mg/dL (ref 0–200)
HDL: 46 mg/dL (ref >40)
LDL Cholesterol: 80 mg/dL (ref 0–99)
Total CHOL/HDL Ratio: 2.9 RATIO
Triglycerides: 46 mg/dL (ref <150)
VLDL: 9 mg/dL (ref 0–40)

## 2018-02-20 ENCOUNTER — Ambulatory Visit: Payer: Worker's Compensation | Admitting: Physician Assistant

## 2018-02-20 ENCOUNTER — Other Ambulatory Visit: Payer: Self-pay | Admitting: Physician Assistant

## 2018-02-20 ENCOUNTER — Encounter: Payer: Self-pay | Admitting: Physician Assistant

## 2018-02-20 VITALS — BP 106/78 | HR 67 | Temp 97.9°F | Ht 67.0 in | Wt 186.5 lb

## 2018-02-20 DIAGNOSIS — Z1211 Encounter for screening for malignant neoplasm of colon: Secondary | ICD-10-CM

## 2018-02-20 DIAGNOSIS — E785 Hyperlipidemia, unspecified: Secondary | ICD-10-CM

## 2018-02-20 DIAGNOSIS — K0889 Other specified disorders of teeth and supporting structures: Secondary | ICD-10-CM

## 2018-02-20 MED ORDER — CLINDAMYCIN HCL 300 MG PO CAPS: 300.0000 mg | ORAL_CAPSULE | Freq: Three times a day (TID) | ORAL | 0 refills | Status: DC

## 2018-02-20 NOTE — Progress Notes (Signed)
BP 106/78 (BP Location: Left Arm, Patient Position: Sitting, Cuff Size: Normal)   Pulse 67   Temp 97.9 F (36.6 C) (Other (Comment))   Ht  (1.702 m)   Wt 186 lb 8 oz (84.6 kg)   SpO2 96%   BMI 29.21 kg/m    Subjective:    Patient ID: Jacob Reilly, male    DOB: December 08, 1965, 52 y.o.   MRN: [id number]  HPI: Mikah Poss is a 52 y.o. male presenting on 02/20/2018 for Hyperlipidemia   HPI  Pt complains of tooth hurting past 5-6 weeks.  Getting worse.  Other than the tooth, he is doing well.  Pt saw cardiology for his pericarditis.  He says that is all resolved now.   Relevant past medical, surgical, family and social history reviewed and updated as indicated. Interim medical history since our last visit reviewed. Allergies and medications reviewed and updated.   Current Outpatient Medications:  .  albuterol (PROVENTIL HFA;VENTOLIN HFA) 108 (90 BASE) MCG/ACT inhaler, Inhale 2 puffs into the lungs every 6 (six) hours as needed for wheezing or shortness of breath., Disp: , Rfl:  .  atorvastatin (LIPITOR) 20 MG tablet, TAKE 1 Tablet BY MOUTH ONCE DAILY, Disp: 90 tablet, Rfl: 3 .  Calcium Carb-Cholecalciferol (CALCIUM 1000 + D PO), Take 2 tablets by mouth daily. , Disp: , Rfl:  .  colchicine 0.6 MG tablet, Take 1 tablet (0.6 mg total) by mouth 2 (two) times daily., Disp: 180 tablet, Rfl: 0 .  ibuprofen (ADVIL,MOTRIN) 800 MG tablet, Take 1 tablet (800 mg total) by mouth 3 (three) times daily., Disp: 42 tablet, Rfl: 0   Review of Systems  Constitutional: Negative for appetite change, chills, diaphoresis, fatigue, fever and unexpected weight change.  HENT: Positive for dental problem. Negative for congestion, drooling, ear pain, facial swelling, hearing loss, mouth sores, sneezing, sore throat, trouble swallowing and voice change.   Eyes: Negative for pain, discharge, redness, itching and visual disturbance.  Respiratory: Negative for cough, choking, shortness of breath and  wheezing.   Cardiovascular: Negative for chest pain, palpitations and leg swelling.  Gastrointestinal: Negative for abdominal pain, blood in stool, constipation, diarrhea and vomiting.  Endocrine: Negative for cold intolerance, heat intolerance and polydipsia.  Genitourinary: Negative for decreased urine volume, dysuria and hematuria.  Musculoskeletal: Positive for arthralgias and back pain. Negative for gait problem.  Skin: Negative for rash.  Allergic/Immunologic: Negative for environmental allergies.  Neurological: Negative for seizures, syncope, light-headedness and headaches.  Hematological: Negative for adenopathy.  Psychiatric/Behavioral: Positive for agitation. Negative for dysphoric mood and suicidal ideas. The patient is nervous/anxious.     Per HPI unless specifically indicated above     Objective:    BP 106/78 (BP Location: Left Arm, Patient Position: Sitting, Cuff Size: Normal)   Pulse 67   Temp 97.9 F (36.6 C) (Other (Comment))   Ht  (1.702 m)   Wt 186 lb 8 oz (84.6 kg)   SpO2 96%   BMI 29.21 kg/m   Wt Readings from Last 3 Encounters:  02/20/18 186 lb 8 oz (84.6 kg)  01/29/18 183 lb (83 kg)  01/01/18 182 lb (82.6 kg)    Physical Exam  Constitutional: He is oriented to person, place, and time. He appears well-developed and well-nourished.  HENT:  Head: Normocephalic and atraumatic.  Mouth/Throat: Uvula is midline and oropharynx is clear and moist. No trismus in the jaw. Abnormal dentition. Dental caries present. No dental abscesses or uvula swelling.  Lower R molar tooth is decayed and tender.  No abscess seen  Neck: Neck supple.  Cardiovascular: Normal rate and regular rhythm.  Pulmonary/Chest: Effort normal and breath sounds normal. He has no wheezes.  Abdominal: Soft. Bowel sounds are normal. There is no hepatosplenomegaly. There is no tenderness.  Musculoskeletal: He exhibits no edema.  Lymphadenopathy:    He has no cervical adenopathy.    Neurological: He is alert and oriented to person, place, and time.  Skin: Skin is warm and dry.  Psychiatric: He has a normal mood and affect. His behavior is normal.  Vitals reviewed.   Results for orders placed or performed during the hospital encounter of 02/14/18  Comprehensive metabolic panel  Result Value Ref Range   Sodium 139 135 - 145 mmol/L   Potassium 4.3 3.5 - 5.1 mmol/L   Chloride 106 101 - 111 mmol/L   CO2 24 22 - 32 mmol/L   Glucose, Bld 100 (H) 65 - 99 mg/dL   BUN 22 (H) 6 - 20 mg/dL   Creatinine, Ser 4.54 0.61 - 1.24 mg/dL   Calcium 9.7 8.9 - 09.8 mg/dL   Total Protein 7.0 6.5 - 8.1 g/dL   Albumin 4.2 3.5 - 5.0 g/dL   AST 20 15 - 41 U/L   ALT 24 17 - 63 U/L   Alkaline Phosphatase 89 38 - 126 U/L   Total Bilirubin 0.9 0.3 - 1.2 mg/dL   GFR calc non Af Amer >60 >60 mL/min   GFR calc Af Amer >60 >60 mL/min   Anion gap 9 5 - 15  Lipid panel  Result Value Ref Range   Cholesterol 135 0 - 200 mg/dL   Triglycerides 46 <119 mg/dL   HDL 46 >14 mg/dL   Total CHOL/HDL Ratio 2.9 RATIO   VLDL 9 0 - 40 mg/dL   LDL Cholesterol 80 0 - 99 mg/dL      Assessment & Plan:   Encounter Diagnoses  Name Primary?  . Hyperlipidemia, unspecified hyperlipidemia type Yes  . Dentalgia   . Special screening for malignant neoplasms, colon      -reviewed labs with pt -pt was given ifobt for colon cancer screening -pt was given rx for clindamycin for his tooth and put on the Dental list -pt to Continue current medications -pt to Follow up 6 months.   RTO sooner prn

## 2018-03-16 ENCOUNTER — Encounter: Payer: Self-pay | Admitting: Cardiovascular Disease

## 2018-03-16 ENCOUNTER — Ambulatory Visit (INDEPENDENT_AMBULATORY_CARE_PROVIDER_SITE_OTHER): Payer: Self-pay | Admitting: Cardiovascular Disease

## 2018-03-16 VITALS — BP 116/74 | HR 73 | Ht 67.0 in | Wt 186.0 lb

## 2018-03-16 DIAGNOSIS — I3139 Other pericardial effusion (noninflammatory): Secondary | ICD-10-CM

## 2018-03-16 DIAGNOSIS — I300 Acute nonspecific idiopathic pericarditis: Secondary | ICD-10-CM

## 2018-03-16 DIAGNOSIS — I313 Pericardial effusion (noninflammatory): Secondary | ICD-10-CM

## 2018-03-16 MED ORDER — IBUPROFEN 800 MG PO TABS: ORAL_TABLET | ORAL | 0 refills | Status: DC

## 2018-03-16 MED ORDER — COLCHICINE 0.6 MG PO TABS
ORAL_TABLET | ORAL | 0 refills | Status: DC
Start: 2018-03-16 — End: 2018-11-26

## 2018-03-16 NOTE — Progress Notes (Signed)
SUBJECTIVE: The patient presents for follow-up of acute pericarditis and pericardial effusion.  A limited echocardiogram performed on 02/02/2018 showed resolution of pericardial effusion and normal left ventricular systolic function, LVEF 55 to 40%.  The patient denies any symptoms of chest pain, palpitations, shortness of breath, lightheadedness, dizziness, leg swelling, orthopnea, PND, and syncope.  While he has been taking ibuprofen 3 times daily, he ran out of colchicine about a week ago because he could not afford it but plans to refill it today.      Review of Systems: As per "subjective", otherwise negative.  Allergies  Allergen Reactions  . Penicillins Other (See Comments)    Childhood Allergy.  Has patient had a PCN reaction causing immediate rash, facial/tongue/throat swelling, SOB or lightheadedness with hypotension: unknown Has patient had a PCN reaction causing severe rash involving mucus membranes or skin necrosis: unknown Has patient had a PCN reaction that required hospitalization unknown Has patient had a PCN reaction occurring within the last 10 years: unknown If all of the above answers are "NO", then may proceed with Cephalosporin use.     Current Outpatient Medications  Medication Sig Dispense Refill  . albuterol (PROVENTIL HFA;VENTOLIN HFA) 108 (90 BASE) MCG/ACT inhaler Inhale 2 puffs into the lungs every 6 (six) hours as needed for wheezing or shortness of breath.    Marland Kitchen atorvastatin (LIPITOR) 20 MG tablet TAKE 1 Tablet BY MOUTH ONCE DAILY 90 tablet 3  . Calcium Carb-Cholecalciferol (CALCIUM 1000 + D PO) Take 2 tablets by mouth daily.     . clindamycin (CLEOCIN) 300 MG capsule Take 1 capsule (300 mg total) by mouth 3 (three) times daily. 21 capsule 0  . colchicine 0.6 MG tablet Take 1 tablet (0.6 mg total) by mouth 2 (two) times daily. 180 tablet 0  . ibuprofen (ADVIL,MOTRIN) 800 MG tablet Take 1 tablet (800 mg total) by mouth 3 (three) times daily. 42  tablet 0   No current facility-administered medications for this visit.     Past Medical History:  Diagnosis Date  . Anxiety   . Asthma    as child-now with allergy season only.  . Depression   . ETOH abuse   . Hyperlipidemia   . Kidney calculi   . Low back pain   . Pneumonia   . Polysubstance abuse (HCC)   . PTSD (post-traumatic stress disorder)     Past Surgical History:  Procedure Laterality Date  . BACK SURGERY    . HYDROCELE EXCISION Left 08/01/2013   Procedure: HYDROCELECTOMY ADULT;  Surgeon: Ky Barban, MD;  Location: [facility];  Service: Urology;  Laterality: Left;  . I&D EXTREMITY  10/24/2012   Procedure: IRRIGATION AND DEBRIDEMENT EXTREMITY;  Surgeon: Tami Ribas, MD;  Location: [facility];  Service: Orthopedics;  Laterality: Right;  . OPEN REDUCTION INTERNAL FIXATION (ORIF) DISTAL RADIAL FRACTURE Right 05/16/2015   Procedure: OPEN REDUCTION INTERNAL FIXATION (ORIF) RIGHT DISTAL RADIUS FRACTURE;  Surgeon: Dairl Ponder, MD;  Location: [facility];  Service: Orthopedics;  Laterality: Right;  . OPEN REDUCTION INTERNAL FIXATION (ORIF) HAND Right thumb  . OPEN REDUCTION INTERNAL FIXATION (ORIF) METACARPAL Left 05/16/2015   Procedure: OPEN REDUCTION INTERNAL FIXATION OF MIDDLE PHALANGEAL FRACTURE, EXTENSOR TENDON REPAIR WITH ULNAR DIGITAL NERVE REPAIR MICROSCOPICALLY;  Surgeon: Dairl Ponder, MD;  Location: [facility];  Service: Orthopedics;  Laterality: Left;  . SHOULDER ARTHROSCOPY WITH ROTATOR CUFF REPAIR AND SUBACROMIAL DECOMPRESSION Left 06/08/2016   Procedure: LEFT SHOULDER ARTHROSCOPY WITH ROTATOR CUFF REPAIR, SUBACROMIAL  DECOMPRESSION, DISTAL CLAVICLE EXCISION;  Surgeon: Tarry Kos, MD;  Location: [facility];  Service: Orthopedics;  Laterality: Left;  . THUMB FUSION Right     Social History   Socioeconomic History  . Marital status: Divorced    Spouse name: Not on file  . Number of children: Not on file  . Years of education: Not on file  . Highest  education level: Not on file  Occupational History  . Not on file  Social Needs  . Financial resource strain: Not on file  . Food insecurity:    Worry: Not on file    Inability: Not on file  . Transportation needs:    Medical: Not on file    Non-medical: Not on file  Tobacco Use  . Smoking status: Former Smoker    Packs/day: 0.25    Years: 44.00    Pack years: 11.00    Types: Cigarettes    Start date: 01/29/1973    Last attempt to quit: 01/15/2018    Years since quitting: 0.1  . Smokeless tobacco: Former Neurosurgeon    Types: Chew  . Tobacco comment: has cut down to 3-4 cigarettes a day since hospitalized  Substance and Sexual Activity  . Alcohol use: Yes    Alcohol/week: 4.8 oz    Types: 8 Cans of beer per week    Comment: hx heavy etoh august 2016  . Drug use: Yes    Types: Marijuana, Cocaine, Other-see comments    Comment: none since august 2016  . Sexual activity: Yes    Birth control/protection: None  Lifestyle  . Physical activity:    Days per week: Not on file    Minutes per session: Not on file  . Stress: Not on file  Relationships  . Social connections:    Talks on phone: Not on file    Gets together: Not on file    Attends religious service: Not on file    Active member of club or organization: Not on file    Attends meetings of clubs or organizations: Not on file    Relationship status: Not on file  . Intimate partner violence:    Fear of current or ex partner: Not on file    Emotionally abused: Not on file    Physically abused: Not on file    Forced sexual activity: Not on file  Other Topics Concern  . Not on file  Social History Narrative  . Not on file     Vitals:   03/16/18 0811  BP: 116/74  Pulse: 73  SpO2: 97%  Weight: 186 lb (84.4 kg)  Height:  (1.702 m)    Wt Readings from Last 3 Encounters:  03/16/18 186 lb (84.4 kg)  02/20/18 186 lb 8 oz (84.6 kg)  01/29/18 183 lb (83 kg)     PHYSICAL EXAM General: NAD HEENT: Normal. Neck:  No JVD, no thyromegaly. Lungs: Clear to auscultation bilaterally with normal respiratory effort. CV: Regular rate and rhythm, normal S1/S2, no S3/S4, no murmur. No pretibial or periankle edema.   Abdomen: Soft, nontender, no distention.  Neurologic: Alert and oriented.  Psych: Normal affect. Skin: Normal. Musculoskeletal: No gross deformities.    ECG: Most recent ECG reviewed.   Labs: Lab Results  Component Value Date/Time   K 4.3 02/14/2018 08:50 AM   BUN 22 (H) 02/14/2018 08:50 AM   CREATININE 0.75 02/14/2018 08:50 AM   CREATININE 0.83 11/18/2016 08:09 AM   ALT 24 02/14/2018  08:50 AM   HGB 13.5 12/27/2017 12:32 PM     Lipids: Lab Results  Component Value Date/Time   LDLCALC 80 02/14/2018 08:50 AM   CHOL 135 02/14/2018 08:50 AM   TRIG 46 02/14/2018 08:50 AM   HDL 46 02/14/2018 08:50 AM       ASSESSMENT AND PLAN:  1.  Acute pericarditis with pleuritic chest pain and episodic shortness of breath: Symptomatically improved without chest pain or shortness of breath.  He is currently taking ibuprofen 800 mg 3 times a day.  I will have him take 800 mg twice daily for 1 week followed by 800 mg once daily for 1 week and then I will have him stop it. I instructed him to continue colchicine 0.6 mg twice daily for an additional 8 weeks.  2.  Pericardial effusion: This has resolved as per echocardiogram reviewed above.    Disposition: Follow up 4 months   Prentice Docker, M.D., F.A.C.C.

## 2018-03-16 NOTE — Patient Instructions (Signed)
Medication Instructions:  TAKE IBUPROFEN 800 MG - TWO TIMES DAILY FOR 1 WEEK , THEN TAKE ONCE DAILY FOR 1 WEEK & THEN STOP   TAKE COLCHICINE 0.6 MG TWO TIMES DAILY THEN STOP (July 27TH)   Labwork: NONE  Testing/Procedures: NONE  Follow-Up: Your physician recommends that you schedule a follow-up appointment in: 4 MONTHS    Any Other Special Instructions Will Be Listed Below (If Applicable).     If you need a refill on your cardiac medications before your next appointment, please call your pharmacy.

## 2018-05-11 ENCOUNTER — Other Ambulatory Visit: Payer: Self-pay | Admitting: Physician Assistant

## 2018-08-23 ENCOUNTER — Encounter: Payer: Self-pay | Admitting: Physician Assistant

## 2018-08-23 ENCOUNTER — Ambulatory Visit: Payer: Worker's Compensation | Admitting: Physician Assistant

## 2018-08-23 VITALS — BP 108/74 | HR 76 | Temp 97.9°F | Ht 67.0 in | Wt 185.5 lb

## 2018-08-23 DIAGNOSIS — E785 Hyperlipidemia, unspecified: Secondary | ICD-10-CM

## 2018-08-23 DIAGNOSIS — R0602 Shortness of breath: Secondary | ICD-10-CM

## 2018-08-23 DIAGNOSIS — F17200 Nicotine dependence, unspecified, uncomplicated: Secondary | ICD-10-CM

## 2018-08-23 DIAGNOSIS — R0989 Other specified symptoms and signs involving the circulatory and respiratory systems: Secondary | ICD-10-CM

## 2018-08-23 NOTE — Progress Notes (Signed)
BP 108/74   Pulse 76   Temp 97.9 F (36.6 C)   Ht 5\' 7"  (1.702 m)   Wt 185 lb 8 oz (84.1 kg)   SpO2 98%   BMI 29.05 kg/m    Subjective:    Patient ID: Jacob Reilly, male    DOB: 07-31-1966, 52 y.o.   MRN: [id number]  HPI: Jacob Reilly is a 52 y.o. male presenting on 08/23/2018 for Hyperlipidemia   HPI   Pt didn't get his labs drawn.    Pt complains of SOB since august.  He's been using his inhaler a lot but it doesn't seem to help much.  No CP.   No fevers. No night sweats.  "just feel like crap".  Pt says this feels nothing like when he had pericarditis a year ago.  Relevant past medical, surgical, family and social history reviewed and updated as indicated. Interim medical history since our last visit reviewed. Allergies and medications reviewed and updated.   Current Outpatient Medications:  .  albuterol (PROVENTIL HFA;VENTOLIN HFA) 108 (90 BASE) MCG/ACT inhaler, Inhale 2 puffs into the lungs every 6 (six) hours as needed for wheezing or shortness of breath., Disp: , Rfl:  .  atorvastatin (LIPITOR) 20 MG tablet, TAKE 1 Tablet BY MOUTH ONCE DAILY, Disp: 90 tablet, Rfl: 3 .  Calcium Carb-Cholecalciferol (CALCIUM 1000 + D PO), Take 2 tablets by mouth daily. , Disp: , Rfl:  .  naproxen sodium (ALEVE) 220 MG tablet, Take 220 mg by mouth., Disp: , Rfl:  .  colchicine 0.6 MG tablet, TAKE 1 TABLET (0.6 MG) TWO TIMES DAILY FOR 8 WEEKS THEN STOP (Patient not taking: Reported on 08/23/2018), Disp: 120 tablet, Rfl: 0    Review of Systems  Constitutional: Positive for fatigue. Negative for appetite change, chills, diaphoresis, fever and unexpected weight change.  HENT: Positive for congestion and trouble swallowing. Negative for dental problem, drooling, ear pain, facial swelling, hearing loss, mouth sores, sneezing, sore throat and voice change.   Eyes: Negative for pain, discharge, redness, itching and visual disturbance.  Respiratory: Positive for cough, shortness of breath and  wheezing. Negative for choking.   Cardiovascular: Negative for chest pain, palpitations and leg swelling.  Gastrointestinal: Negative for abdominal pain, blood in stool, constipation, diarrhea and vomiting.  Endocrine: Negative for cold intolerance, heat intolerance and polydipsia.  Genitourinary: Negative for decreased urine volume, dysuria and hematuria.  Musculoskeletal: Positive for arthralgias, back pain and gait problem.  Skin: Negative for rash.  Allergic/Immunologic: Negative for environmental allergies.  Neurological: Negative for seizures, syncope, light-headedness and headaches.  Hematological: Negative for adenopathy.  Psychiatric/Behavioral: Positive for agitation and dysphoric mood. Negative for suicidal ideas. The patient is nervous/anxious.     Per HPI unless specifically indicated above     Objective:    BP 108/74   Pulse 76   Temp 97.9 F (36.6 C)   Ht 5\' 7"  (1.702 m)   Wt 185 lb 8 oz (84.1 kg)   SpO2 98%   BMI 29.05 kg/m   Wt Readings from Last 3 Encounters:  08/23/18 185 lb 8 oz (84.1 kg)  03/16/18 186 lb (84.4 kg)  02/20/18 186 lb 8 oz (84.6 kg)    Physical Exam  Constitutional: He is oriented to person, place, and time. He appears well-developed and well-nourished.  HENT:  Head: Normocephalic and atraumatic.  Neck: Neck supple.  Cardiovascular: Normal rate and regular rhythm.  Pulmonary/Chest: Effort normal. No accessory muscle usage. No tachypnea and no bradypnea.  No respiratory distress. He has no wheezes. He has no rhonchi. He has rales in the right lower field.  Abdominal: Soft. Bowel sounds are normal. There is no hepatosplenomegaly. There is no tenderness.  Musculoskeletal: He exhibits no edema.  Lymphadenopathy:    He has no cervical adenopathy.  Neurological: He is alert and oriented to person, place, and time.  Skin: Skin is warm and dry.  Psychiatric: He has a normal mood and affect. His behavior is normal.  Vitals reviewed.    crackles  R base      Assessment & Plan:    Encounter Diagnoses  Name Primary?  . Hyperlipidemia, unspecified hyperlipidemia type Yes  . SOB (shortness of breath)   . Lung crackles   . Tobacco use disorder     -will get CXR and labs.  Discussed with pt since this doesn't look or sound like COPD related issues.  May need CT or echo if CXR and labs unrevealing -pt is given application for [employer] -pt will be scheduled to follow up in 3 months for lipids.  He is aware he will likely RTO sooner to discuss test results

## 2018-08-24 ENCOUNTER — Other Ambulatory Visit (HOSPITAL_COMMUNITY)
Admission: RE | Admit: 2018-08-24 | Discharge: 2018-08-24 | Disposition: A | Discharge status: Home / Self Care | Payer: Self-pay | Source: Ambulatory Visit | Attending: Physician Assistant | Admitting: Physician Assistant

## 2018-08-24 ENCOUNTER — Ambulatory Visit (HOSPITAL_COMMUNITY)
Admission: RE | Admit: 2018-08-24 | Discharge: 2018-08-24 | Disposition: A | Discharge status: Home / Self Care | Payer: Self-pay | Source: Ambulatory Visit | Attending: Physician Assistant | Admitting: Physician Assistant

## 2018-08-24 DIAGNOSIS — R0989 Other specified symptoms and signs involving the circulatory and respiratory systems: Secondary | ICD-10-CM | POA: Insufficient documentation

## 2018-08-24 DIAGNOSIS — J90 Pleural effusion, not elsewhere classified: Secondary | ICD-10-CM | POA: Insufficient documentation

## 2018-08-24 DIAGNOSIS — R0602 Shortness of breath: Secondary | ICD-10-CM | POA: Insufficient documentation

## 2018-08-24 DIAGNOSIS — E785 Hyperlipidemia, unspecified: Secondary | ICD-10-CM | POA: Insufficient documentation

## 2018-08-24 DIAGNOSIS — R918 Other nonspecific abnormal finding of lung field: Secondary | ICD-10-CM | POA: Insufficient documentation

## 2018-08-24 LAB — CBC WITH DIFFERENTIAL/PLATELET
ABS IMMATURE GRANULOCYTES: 0.05 10*3/uL (ref 0.00–0.07)
BASOS ABS: 0.1 10*3/uL (ref 0.0–0.1)
Basophils Relative: 1 %
Eosinophils Absolute: 0.5 10*3/uL (ref 0.0–0.5)
Eosinophils Relative: 5 %
HEMATOCRIT: 47.6 % (ref 39.0–52.0)
HEMOGLOBIN: 15.3 g/dL (ref 13.0–17.0)
Immature Granulocytes: 1 %
LYMPHS PCT: 23 %
Lymphs Abs: 2 10*3/uL (ref 0.7–4.0)
MCH: 28.6 pg (ref 26.0–34.0)
MCHC: 32.1 g/dL (ref 30.0–36.0)
MCV: 89 fL (ref 80.0–100.0)
MONO ABS: 0.7 10*3/uL (ref 0.1–1.0)
Monocytes Relative: 8 %
Neutro Abs: 5.5 10*3/uL (ref 1.7–7.7)
Neutrophils Relative %: 62 %
Platelets: 434 10*3/uL — ABNORMAL HIGH (ref 150–400)
RBC: 5.35 MIL/uL (ref 4.22–5.81)
RDW: 13.7 % (ref 11.5–15.5)
WBC: 8.8 10*3/uL (ref 4.0–10.5)
nRBC: 0 % (ref 0.0–0.2)

## 2018-08-24 LAB — COMPREHENSIVE METABOLIC PANEL
ALT: 15 U/L (ref 0–44)
AST: 19 U/L (ref 15–41)
Albumin: 4.4 g/dL (ref 3.5–5.0)
Alkaline Phosphatase: 108 U/L (ref 38–126)
Anion gap: 7 (ref 5–15)
BILIRUBIN TOTAL: 0.4 mg/dL (ref 0.3–1.2)
BUN: 18 mg/dL (ref 6–20)
CO2: 28 mmol/L (ref 22–32)
CREATININE: 0.93 mg/dL (ref 0.61–1.24)
Calcium: 9.7 mg/dL (ref 8.9–10.3)
Chloride: 102 mmol/L (ref 98–111)
GFR calc Af Amer: >60 mL/min (ref >60)
Glucose, Bld: 102 mg/dL — ABNORMAL HIGH (ref 70–99)
Potassium: 4.8 mmol/L (ref 3.5–5.1)
Sodium: 137 mmol/L (ref 135–145)
Total Protein: 8 g/dL (ref 6.5–8.1)

## 2018-08-24 LAB — LIPID PANEL
CHOL/HDL RATIO: 3.1 ratio
CHOLESTEROL: 145 mg/dL (ref 0–200)
HDL: 47 mg/dL (ref >40)
LDL Cholesterol: 86 mg/dL (ref 0–99)
TRIGLYCERIDES: 60 mg/dL (ref <150)
VLDL: 12 mg/dL (ref 0–40)

## 2018-08-24 LAB — BRAIN NATRIURETIC PEPTIDE: B NATRIURETIC PEPTIDE 5: 12 pg/mL (ref 0.0–100.0)

## 2018-08-27 ENCOUNTER — Other Ambulatory Visit: Payer: Self-pay | Admitting: Physician Assistant

## 2018-08-27 ENCOUNTER — Telehealth: Payer: Self-pay | Admitting: Student

## 2018-08-27 DIAGNOSIS — R0602 Shortness of breath: Secondary | ICD-10-CM

## 2018-08-27 DIAGNOSIS — J90 Pleural effusion, not elsewhere classified: Secondary | ICD-10-CM

## 2018-08-27 NOTE — Telephone Encounter (Signed)
Called and notified pt that blood tests didn't show anything that would explain his sob and that his CXR showed pleural effusion (fluid) in his lungs. Pt was informed that a CT is recommended to further evaluate. Pt verbalized understanding and agrees to getting CT.   LPN scheduled CT for 08-30-18 at 7pm at [facility]. Pt has been notified and is aware of time, date, and location. Pt requests for [employer] application to be mailed to him.  Application mailed out to him on 08-27-18

## 2018-08-27 NOTE — Telephone Encounter (Signed)
-----   Message from [name], [place] sent at 08/25/2018 11:08 AM EST ----- Please let pt know that CXR showed a pleural effusion (some fluid) in his lungs.  His blood tests didn't show anything that would help explain his sob.  Recommend CT to further evaluate.  You can order it if he agrees (dx SOB, pleural effusion).  Make sure to remind him to submit his [employer] application.  thanks

## 2018-08-30 ENCOUNTER — Ambulatory Visit (HOSPITAL_COMMUNITY)
Admission: RE | Admit: 2018-08-30 | Discharge: 2018-08-30 | Disposition: A | Discharge status: Home / Self Care | Payer: Self-pay | Source: Ambulatory Visit | Attending: Physician Assistant | Admitting: Physician Assistant

## 2018-08-30 DIAGNOSIS — J9811 Atelectasis: Secondary | ICD-10-CM | POA: Insufficient documentation

## 2018-08-30 DIAGNOSIS — R0602 Shortness of breath: Secondary | ICD-10-CM

## 2018-08-30 DIAGNOSIS — I700 Atherosclerosis of aorta: Secondary | ICD-10-CM | POA: Insufficient documentation

## 2018-08-30 DIAGNOSIS — I2510 Atherosclerotic heart disease of native coronary artery without angina pectoris: Secondary | ICD-10-CM | POA: Insufficient documentation

## 2018-08-30 DIAGNOSIS — J90 Pleural effusion, not elsewhere classified: Secondary | ICD-10-CM

## 2018-09-18 ENCOUNTER — Ambulatory Visit: Payer: Worker's Compensation | Admitting: Physician Assistant

## 2018-09-18 ENCOUNTER — Encounter: Payer: Self-pay | Admitting: Physician Assistant

## 2018-09-18 VITALS — BP 116/72 | HR 64 | Temp 98.1°F | Resp 18 | Ht 67.0 in | Wt 181.0 lb

## 2018-09-18 DIAGNOSIS — R7989 Other specified abnormal findings of blood chemistry: Secondary | ICD-10-CM

## 2018-09-18 DIAGNOSIS — R9389 Abnormal findings on diagnostic imaging of other specified body structures: Secondary | ICD-10-CM

## 2018-09-18 DIAGNOSIS — J90 Pleural effusion, not elsewhere classified: Secondary | ICD-10-CM

## 2018-09-18 DIAGNOSIS — R911 Solitary pulmonary nodule: Secondary | ICD-10-CM

## 2018-09-18 DIAGNOSIS — F17200 Nicotine dependence, unspecified, uncomplicated: Secondary | ICD-10-CM

## 2018-09-18 DIAGNOSIS — E785 Hyperlipidemia, unspecified: Secondary | ICD-10-CM

## 2018-09-18 DIAGNOSIS — R0602 Shortness of breath: Secondary | ICD-10-CM

## 2018-09-18 MED ORDER — ALBUTEROL SULFATE HFA 108 (90 BASE) MCG/ACT IN AERS: 2.0000 | INHALATION_SPRAY | Freq: Four times a day (QID) | RESPIRATORY_TRACT | 4 refills | Status: DC | PRN

## 2018-09-18 NOTE — Progress Notes (Signed)
BP 116/72 (BP Location: Right Arm, Patient Position: Sitting, Cuff Size: Normal)   Pulse 64   Temp 98.1 F (36.7 C) (Other (Comment))   Resp 18   Ht 5\' 7"  (1.702 m)   Wt 181 lb (82.1 kg)   SpO2 98%   BMI 28.35 kg/m    Subjective:    Patient ID: Jacob Reilly, male    DOB: 02-21-1966, 52 y.o.   MRN: [id number]  HPI: Keiston Manley is a 52 y.o. male presenting on 09/18/2018 for Follow-up (sob)   HPI  Pt says he is about the same- still with SOB.  He says he is using his inhaler but he can't tell that it helps at all.  Pt had pericarditis in 2018 but says that this feels very different.   Pt continues to smoke.   Pt says he hasn't yet turned in his application for [employer].   Relevant past medical, surgical, family and social history reviewed and updated as indicated. Interim medical history since our last visit reviewed. Allergies and medications reviewed and updated.   Current Outpatient Medications:  .  albuterol (PROVENTIL HFA;VENTOLIN HFA) 108 (90 BASE) MCG/ACT inhaler, Inhale 2 puffs into the lungs every 6 (six) hours as needed for wheezing or shortness of breath., Disp: , Rfl:  .  atorvastatin (LIPITOR) 20 MG tablet, TAKE 1 Tablet BY MOUTH ONCE DAILY, Disp: 90 tablet, Rfl: 3 .  Calcium Carb-Cholecalciferol (CALCIUM 1000 + D PO), Take 2 tablets by mouth daily. , Disp: , Rfl:  .  naproxen sodium (ALEVE) 220 MG tablet, Take 220 mg by mouth., Disp: , Rfl:  .  colchicine 0.6 MG tablet, TAKE 1 TABLET (0.6 MG) TWO TIMES DAILY FOR 8 WEEKS THEN STOP (Patient not taking: Reported on 08/23/2018), Disp: 120 tablet, Rfl: 0   Review of Systems  Constitutional: Negative for appetite change, chills, diaphoresis, fatigue, fever and unexpected weight change.  HENT: Positive for congestion and sneezing. Negative for dental problem, drooling, ear pain, facial swelling, hearing loss, mouth sores, sore throat, trouble swallowing and voice change.   Eyes: Negative for pain, discharge,  redness, itching and visual disturbance.  Respiratory: Positive for cough, shortness of breath and wheezing. Negative for choking.   Cardiovascular: Negative for chest pain, palpitations and leg swelling.  Gastrointestinal: Negative for abdominal pain, blood in stool, constipation, diarrhea and vomiting.  Endocrine: Negative for cold intolerance, heat intolerance and polydipsia.  Genitourinary: Negative for decreased urine volume, dysuria and hematuria.  Musculoskeletal: Positive for arthralgias and back pain. Negative for gait problem.  Skin: Negative for rash.  Allergic/Immunologic: Negative for environmental allergies.  Neurological: Negative for seizures, syncope, light-headedness and headaches.  Hematological: Negative for adenopathy.  Psychiatric/Behavioral: Positive for dysphoric mood. Negative for agitation and suicidal ideas. The patient is nervous/anxious.     Per HPI unless specifically indicated above     Objective:    BP 116/72 (BP Location: Right Arm, Patient Position: Sitting, Cuff Size: Normal)   Pulse 64   Temp 98.1 F (36.7 C) (Other (Comment))   Resp 18   Ht 5\' 7"  (1.702 m)   Wt 181 lb (82.1 kg)   SpO2 98%   BMI 28.35 kg/m   Wt Readings from Last 3 Encounters:  09/18/18 181 lb (82.1 kg)  08/23/18 185 lb 8 oz (84.1 kg)  03/16/18 186 lb (84.4 kg)    Physical Exam  Constitutional: He is oriented to person, place, and time. He appears well-developed and well-nourished.  HENT:  Head: Normocephalic and atraumatic.  Neck: Neck supple.  Cardiovascular: Normal rate and regular rhythm.  Pulmonary/Chest: Effort normal. No respiratory distress. He has no wheezes.  A few soft Crackles on the right   Abdominal: Soft. Bowel sounds are normal. There is no hepatosplenomegaly. There is no tenderness.  Musculoskeletal: He exhibits no edema.  Lymphadenopathy:    He has no cervical adenopathy.  Neurological: He is alert and oriented to person, place, and time.  Skin:  Skin is warm and dry.  Psychiatric: He has a normal mood and affect. His behavior is normal.  Vitals reviewed.   Results for orders placed or performed during the hospital encounter of 08/24/18  CBC w/Diff/Platelet  Result Value Ref Range   WBC 8.8 4.0 - 10.5 K/uL   RBC 5.35 4.22 - 5.81 MIL/uL   Hemoglobin 15.3 13.0 - 17.0 g/dL   HCT 66.447.6 40.339.0 - 47.452.0 %   MCV 89.0 80.0 - 100.0 fL   MCH 28.6 26.0 - 34.0 pg   MCHC 32.1 30.0 - 36.0 g/dL   RDW 25.913.7 56.311.5 - 87.515.5 %   Platelets 434 (H) 150 - 400 K/uL   nRBC 0.0 0.0 - 0.2 %   Neutrophils Relative % 62 %   Neutro Abs 5.5 1.7 - 7.7 K/uL   Lymphocytes Relative 23 %   Lymphs Abs 2.0 0.7 - 4.0 K/uL   Monocytes Relative 8 %   Monocytes Absolute 0.7 0.1 - 1.0 K/uL   Eosinophils Relative 5 %   Eosinophils Absolute 0.5 0.0 - 0.5 K/uL   Basophils Relative 1 %   Basophils Absolute 0.1 0.0 - 0.1 K/uL   Immature Granulocytes 1 %   Abs Immature Granulocytes 0.05 0.00 - 0.07 K/uL  Lipid panel  Result Value Ref Range   Cholesterol 145 0 - 200 mg/dL   Triglycerides 60 <643<150 mg/dL   HDL 47 >32>40 mg/dL   Total CHOL/HDL Ratio 3.1 RATIO   VLDL 12 0 - 40 mg/dL   LDL Cholesterol 86 0 - 99 mg/dL  Comprehensive metabolic panel  Result Value Ref Range   Sodium 137 135 - 145 mmol/L   Potassium 4.8 3.5 - 5.1 mmol/L   Chloride 102 98 - 111 mmol/L   CO2 28 22 - 32 mmol/L   Glucose, Bld 102 (H) 70 - 99 mg/dL   BUN 18 6 - 20 mg/dL   Creatinine, Ser 9.510.93 0.61 - 1.24 mg/dL   Calcium 9.7 8.9 - 88.410.3 mg/dL   Total Protein 8.0 6.5 - 8.1 g/dL   Albumin 4.4 3.5 - 5.0 g/dL   AST 19 15 - 41 U/L   ALT 15 0 - 44 U/L   Alkaline Phosphatase 108 38 - 126 U/L   Total Bilirubin 0.4 0.3 - 1.2 mg/dL   GFR calc non Af Amer >60 >60 mL/min   GFR calc Af Amer >60 >60 mL/min   Anion gap 7 5 - 15  B Nat Peptide  Result Value Ref Range   B Natriuretic Peptide 12.0 0.0 - 100.0 pg/mL      Assessment & Plan:   Encounter Diagnoses  Name Primary?  . SOB (shortness of breath)  Yes  . Pleural effusion, not elsewhere classified   . Pulmonary nodule   . Abnormal CT of the chest   . Tobacco use disorder   . Elevated platelet count   . 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  He is to RTO sooner prn new symptoms or worsening

## 2018-11-23 ENCOUNTER — Other Ambulatory Visit (HOSPITAL_COMMUNITY)
Admission: RE | Admit: 2018-11-23 | Discharge: 2018-11-23 | Disposition: A | Discharge status: Home / Self Care | Payer: Self-pay | Source: Ambulatory Visit | Attending: Physician Assistant | Admitting: Physician Assistant

## 2018-11-23 DIAGNOSIS — R7989 Other specified abnormal findings of blood chemistry: Secondary | ICD-10-CM | POA: Insufficient documentation

## 2018-11-23 DIAGNOSIS — E785 Hyperlipidemia, unspecified: Secondary | ICD-10-CM | POA: Insufficient documentation

## 2018-11-23 LAB — CBC
HCT: 45.9 % (ref 39.0–52.0)
Hemoglobin: 15 g/dL (ref 13.0–17.0)
MCH: 28.7 pg (ref 26.0–34.0)
MCHC: 32.7 g/dL (ref 30.0–36.0)
MCV: 87.9 fL (ref 80.0–100.0)
NRBC: 0 % (ref 0.0–0.2)
PLATELETS: 324 10*3/uL (ref 150–400)
RBC: 5.22 MIL/uL (ref 4.22–5.81)
RDW: 13.6 % (ref 11.5–15.5)
WBC: 7 10*3/uL (ref 4.0–10.5)

## 2018-11-23 LAB — LIPID PANEL
Cholesterol: 143 mg/dL (ref 0–200)
HDL: 51 mg/dL (ref >40)
LDL Cholesterol: 81 mg/dL (ref 0–99)
TRIGLYCERIDES: 55 mg/dL (ref <150)
Total CHOL/HDL Ratio: 2.8 RATIO
VLDL: 11 mg/dL (ref 0–40)

## 2018-11-23 LAB — COMPREHENSIVE METABOLIC PANEL
ALK PHOS: 76 U/L (ref 38–126)
ALT: 19 U/L (ref 0–44)
AST: 17 U/L (ref 15–41)
Albumin: 4.5 g/dL (ref 3.5–5.0)
Anion gap: 7 (ref 5–15)
BUN: 27 mg/dL — ABNORMAL HIGH (ref 6–20)
CO2: 26 mmol/L (ref 22–32)
Calcium: 9.6 mg/dL (ref 8.9–10.3)
Chloride: 106 mmol/L (ref 98–111)
Creatinine, Ser: 0.88 mg/dL (ref 0.61–1.24)
Glucose, Bld: 91 mg/dL (ref 70–99)
Potassium: 4.6 mmol/L (ref 3.5–5.1)
SODIUM: 139 mmol/L (ref 135–145)
Total Bilirubin: 0.7 mg/dL (ref 0.3–1.2)
Total Protein: 7.3 g/dL (ref 6.5–8.1)

## 2018-11-26 ENCOUNTER — Ambulatory Visit: Payer: Worker's Compensation | Admitting: Physician Assistant

## 2018-11-26 ENCOUNTER — Encounter: Payer: Self-pay | Admitting: Internal Medicine

## 2018-11-26 ENCOUNTER — Encounter: Payer: Self-pay | Admitting: Physician Assistant

## 2018-11-26 ENCOUNTER — Ambulatory Visit (INDEPENDENT_AMBULATORY_CARE_PROVIDER_SITE_OTHER): Payer: Self-pay | Admitting: Internal Medicine

## 2018-11-26 VITALS — BP 116/64 | HR 81 | Ht 67.0 in | Wt 184.0 lb

## 2018-11-26 VITALS — BP 110/80 | HR 68 | Temp 98.1°F | Ht 67.0 in | Wt 185.5 lb

## 2018-11-26 DIAGNOSIS — R0609 Other forms of dyspnea: Secondary | ICD-10-CM | POA: Insufficient documentation

## 2018-11-26 DIAGNOSIS — K219 Gastro-esophageal reflux disease without esophagitis: Secondary | ICD-10-CM

## 2018-11-26 DIAGNOSIS — R911 Solitary pulmonary nodule: Secondary | ICD-10-CM

## 2018-11-26 DIAGNOSIS — R0602 Shortness of breath: Secondary | ICD-10-CM

## 2018-11-26 DIAGNOSIS — F17210 Nicotine dependence, cigarettes, uncomplicated: Secondary | ICD-10-CM

## 2018-11-26 DIAGNOSIS — F17200 Nicotine dependence, unspecified, uncomplicated: Secondary | ICD-10-CM

## 2018-11-26 DIAGNOSIS — R9389 Abnormal findings on diagnostic imaging of other specified body structures: Secondary | ICD-10-CM

## 2018-11-26 DIAGNOSIS — E785 Hyperlipidemia, unspecified: Secondary | ICD-10-CM

## 2018-11-26 MED ORDER — OMEPRAZOLE 40 MG PO CPDR: 40.0000 mg | DELAYED_RELEASE_CAPSULE | Freq: Every day | ORAL | 1 refills | Status: DC

## 2018-11-26 NOTE — Patient Instructions (Signed)
Only use your albuterol as a rescue medication to be used if you can't catch your breath by resting or doing a relaxed purse lip breathing pattern.  - The less you use it, the better it will work when you need it. - Ok to use up to 2 puffs  every 4 hours if you must but call for immediate appointment if use goes up over your usual need - Don't leave home without it !!  (think of it like the spare tire for your car)    Please see patient coordinator before you leave today  [facility] and I will call you with results and recs

## 2018-11-26 NOTE — Progress Notes (Signed)
Jacob Reilly, male    DOB: [birth year],    MRN: [id number]   Brief patient profile:  60 yowm active smoker told asthma as child but no recollection of any symtoms and ok by HS with sports  then by 20's started needing inhalers but just albuterol avg 2-3 x per weekly but they stopped working as well since winter 2019 limited by back and filed for disability but still waiting to hear and in meantime worse sob x 06/2018 > abn cxr 08/24/18 > CT chest 08/30/18 with spn  So referred to pulmonary clinic 11/26/2018 by Carollee Herter McElrow/ PA   Has had multiple traumatic injuries  esp to R shoulder and chest in 05/2016 with multiple R Rib fx    History of Present Illness  11/26/2018  Pulmonary/ 1st office eval/Aarushi Hemric  Chief Complaint  Patient presents with  . Pulmonary Consult    Referred by Dr. [name] for eval of pulmonary nodule and SOB. Pt c/o DOE for the past 1-2 years. He gets winded walking approx 200 ft. He is coughing also-non prod.  Dyspnea:  Gradually worse x 1 year to [facility] = can't walk 100 yards even at a slow pace at a flat grade s stopping due to sob   Cough: min esp in am, nothing bloody Sleep: sometimes cough wakes him  SABA use: none on day of ov/ avg 2-3 x per week    No obvious day to day or daytime variability or assoc excess/ purulent sputum or mucus plugs or hemoptysis or cp or chest tightness, subjective wheeze or overt sinus or hb symptoms.   Sleeping flat  without nocturnal   exacerbation  of respiratory  c/o's or need for noct saba. Also denies any obvious fluctuation of symptoms with weather or environmental changes or other aggravating or alleviating factors except as outlined above   No unusual exposure hx or h/o childhood pna or knowledge of premature birth.  Current Allergies, Complete Past Medical History, Past Surgical History, Family History, and Social History were reviewed in [employer] record.  ROS  The following are not active  complaints unless bolded Hoarseness, sore throat, dysphagia, dental problems, itching, sneezing,  nasal congestion or discharge of excess mucus or purulent secretions, ear ache,   fever, chills, sweats, unintended wt loss or wt gain, classically pleuritic or exertional cp,  orthopnea pnd or arm/hand swelling  or leg swelling, presyncope, palpitations, abdominal pain, anorexia, nausea, vomiting, diarrhea  or change in bowel habits or change in bladder habits, change in stools or change in urine, dysuria, hematuria,  rash, arthralgias, visual complaints, headache, numbness, weakness or ataxia or problems with walking or coordination,  change in mood or  memory.             Past Medical History:  Diagnosis Date  . Anxiety   . Asthma    as child-now with allergy season only.  . Depression   . ETOH abuse   . Hyperlipidemia   . Kidney calculi   . Low back pain   . Pneumonia   . Polysubstance abuse (HCC)   . PTSD (post-traumatic stress disorder)     Outpatient Medications Prior to Visit  Medication Sig Dispense Refill  . albuterol (PROVENTIL HFA;VENTOLIN HFA) 108 (90 Base) MCG/ACT inhaler Inhale 2 puffs into the lungs every 6 (six) hours as needed for wheezing or shortness of breath. 1 Inhaler 4  . atorvastatin (LIPITOR) 20 MG tablet TAKE 1 Tablet BY MOUTH ONCE DAILY 90  tablet 3  . Calcium Carb-Cholecalciferol (CALCIUM 1000 + D PO) Take 1 tablet by mouth 2 (two) times daily.     . colchicine 0.6 MG tablet TAKE 1 TABLET (0.6 MG) TWO TIMES DAILY FOR 8 WEEKS THEN STOP (Patient not taking: Reported on 08/23/2018) 120 tablet 0  . naproxen sodium (ALEVE) 220 MG tablet Take 220 mg by mouth.    Marland Kitchen omeprazole (PRILOSEC) 40 MG capsule Take 1 capsule (40 mg total) by mouth daily. 90 capsule 1      Objective:     BP 116/64 (BP Location: Left Arm, Cuff Size: Normal)   Pulse 81   Ht 5\' 7"  (1.702 m)   Wt 184 lb (83.5 kg)   SpO2 98%   BMI 28.82 kg/m     Vital signs reviewed - Note on arrival 02  sats  98% on RA  amb wm nad   HEENT: nl dentition, turbinates bilaterally, and oropharynx. Nl external ear canals without cough reflex   NECK :  without JVD/Nodes/TM/ nl carotid upstrokes bilaterally   LUNGS: no acc muscle use,  Nl contour chest with minimal insp crackles in R> L bases  without cough on insp or exp maneuvers   CV:  RRR  no s3 or murmur or increase in P2, and no edema   ABD:  soft and nontender with nl inspiratory excursion in the supine position. No bruits or organomegaly appreciated, bowel sounds nl  MS:  Nl gait/ ext warm without deformities, calf tenderness, cyanosis or clubbing No obvious joint restrictions   SKIN: warm and dry without lesions    NEURO:  alert, approp, nl sensorium with  no motor or cerebellar deficits apparent.        I personally reviewed images and agree with radiology impression as follows:   Chest CT  08/30/18 1. Chronic atelectasis at the RIGHT lung base. Small RIGHT pleural effusion, possibly accentuated by underlying chronic pleural thickening. The pleural effusion may also be chronic and related to the overlying old rib fractures. 2. 11 mm nodular density within the superior segment of the RIGHT lower lobe, medial aspect, pleural based, measuring 12 mm. Consider one of the following in 3 months for both low-risk and high-risk individuals:      Assessment   No problem-specific Assessment & Plan notes found for this encounter.     Sandrea Hughs, MD 11/26/2018

## 2018-11-26 NOTE — Progress Notes (Signed)
BP 110/80 (BP Location: Left Arm, Patient Position: Sitting, Cuff Size: Normal)   Pulse 68   Temp 98.1 F (36.7 C)   Ht 5\' 7"  (1.702 m)   Wt 185 lb 8 oz (84.1 kg)   SpO2 97%   BMI 29.05 kg/m    Subjective:    Patient ID: Jacob Reilly, male    DOB: 1966-10-14, 53 y.o.   MRN: [id number]  HPI: Jacob Reilly is a 53 y.o. male presenting on 11/26/2018 for Follow-up   HPI   Pt with SOB.  It started around August 2019.  He had CT chest that was abnormal in November with nodule and effusion.  Pt has not gotten any improvement with inhalers.  .he had echo done April 2019  Pt has still not turned in his [employer] application.    Pt is still having sob.  He is still smoking  Pt uses the inhaler a couple times/week because it doesn't help him any (so he doesn't use it more)  Pt states some acid reflux recently.  He is not using OTC for this.   Relevant past medical, surgical, family and social history reviewed and updated as indicated. Interim medical history since our last visit reviewed. Allergies and medications reviewed and updated.   Current Outpatient Medications:  .  albuterol (PROVENTIL HFA;VENTOLIN HFA) 108 (90 Base) MCG/ACT inhaler, Inhale 2 puffs into the lungs every 6 (six) hours as needed for wheezing or shortness of breath., Disp: 1 Inhaler, Rfl: 4 .  atorvastatin (LIPITOR) 20 MG tablet, TAKE 1 Tablet BY MOUTH ONCE DAILY, Disp: 90 tablet, Rfl: 3 .  Calcium Carb-Cholecalciferol (CALCIUM 1000 + D PO), Take 1 tablet by mouth 2 (two) times daily. , Disp: , Rfl:  .  naproxen sodium (ALEVE) 220 MG tablet, Take 220 mg by mouth., Disp: , Rfl:  .  colchicine 0.6 MG tablet, TAKE 1 TABLET (0.6 MG) TWO TIMES DAILY FOR 8 WEEKS THEN STOP (Patient not taking: Reported on 08/23/2018), Disp: 120 tablet, Rfl: 0    Review of Systems  Constitutional: Negative for appetite change, chills, diaphoresis, fatigue, fever and unexpected weight change.  HENT: Negative for congestion,  dental problem, drooling, ear pain, facial swelling, hearing loss, mouth sores, sneezing, sore throat, trouble swallowing and voice change.   Eyes: Negative for pain, discharge, redness, itching and visual disturbance.  Respiratory: Positive for cough, shortness of breath and wheezing. Negative for choking.   Cardiovascular: Negative for chest pain, palpitations and leg swelling.  Gastrointestinal: Negative for abdominal pain, blood in stool, constipation, diarrhea and vomiting.  Endocrine: Negative for cold intolerance, heat intolerance and polydipsia.  Genitourinary: Negative for decreased urine volume, dysuria and hematuria.  Musculoskeletal: Positive for arthralgias and back pain. Negative for gait problem.  Skin: Negative for rash.  Allergic/Immunologic: Negative for environmental allergies.  Neurological: Negative for seizures, syncope, light-headedness and headaches.  Hematological: Negative for adenopathy.  Psychiatric/Behavioral: Positive for dysphoric mood. Negative for agitation and suicidal ideas. The patient is nervous/anxious.     Per HPI unless specifically indicated above     Objective:    BP 110/80 (BP Location: Left Arm, Patient Position: Sitting, Cuff Size: Normal)   Pulse 68   Temp 98.1 F (36.7 C)   Ht 5\' 7"  (1.702 m)   Wt 185 lb 8 oz (84.1 kg)   SpO2 97%   BMI 29.05 kg/m   Wt Readings from Last 3 Encounters:  11/26/18 185 lb 8 oz (84.1 kg)  09/18/18 181  lb (82.1 kg)  08/23/18 185 lb 8 oz (84.1 kg)    Physical Exam Vitals signs reviewed.  Constitutional:      Appearance: He is well-developed.  HENT:     Head: Normocephalic and atraumatic.  Neck:     Musculoskeletal: Neck supple.  Cardiovascular:     Rate and Rhythm: Normal rate and regular rhythm.  Pulmonary:     Effort: Pulmonary effort is normal. No respiratory distress.     Breath sounds: Rales (on the Right) present. No wheezing.  Abdominal:     General: Bowel sounds are normal.      Palpations: Abdomen is soft.     Tenderness: There is no abdominal tenderness.  Musculoskeletal:     Right lower leg: No edema.     Left lower leg: No edema.  Lymphadenopathy:     Cervical: No cervical adenopathy.  Skin:    General: Skin is warm and dry.  Neurological:     Mental Status: He is alert and oriented to person, place, and time.  Psychiatric:        Mood and Affect: Mood normal.        Behavior: Behavior normal.     Results for orders placed or performed during the hospital encounter of 11/23/18  CBC  Result Value Ref Range   WBC 7.0 4.0 - 10.5 K/uL   RBC 5.22 4.22 - 5.81 MIL/uL   Hemoglobin 15.0 13.0 - 17.0 g/dL   HCT 16.145.9 09.639.0 - 04.552.0 %   MCV 87.9 80.0 - 100.0 fL   MCH 28.7 26.0 - 34.0 pg   MCHC 32.7 30.0 - 36.0 g/dL   RDW 40.913.6 81.111.5 - 91.415.5 %   Platelets 324 150 - 400 K/uL   nRBC 0.0 0.0 - 0.2 %  Lipid panel  Result Value Ref Range   Cholesterol 143 0 - 200 mg/dL   Triglycerides 55 <782<150 mg/dL   HDL 51 >95>40 mg/dL   Total CHOL/HDL Ratio 2.8 RATIO   VLDL 11 0 - 40 mg/dL   LDL Cholesterol 81 0 - 99 mg/dL  Comprehensive metabolic panel  Result Value Ref Range   Sodium 139 135 - 145 mmol/L   Potassium 4.6 3.5 - 5.1 mmol/L   Chloride 106 98 - 111 mmol/L   CO2 26 22 - 32 mmol/L   Glucose, Bld 91 70 - 99 mg/dL   BUN 27 (H) 6 - 20 mg/dL   Creatinine, Ser 6.210.88 0.61 - 1.24 mg/dL   Calcium 9.6 8.9 - 30.810.3 mg/dL   Total Protein 7.3 6.5 - 8.1 g/dL   Albumin 4.5 3.5 - 5.0 g/dL   AST 17 15 - 41 U/L   ALT 19 0 - 44 U/L   Alkaline Phosphatase 76 38 - 126 U/L   Total Bilirubin 0.7 0.3 - 1.2 mg/dL   GFR calc non Af Amer >60 >60 mL/min   GFR calc Af Amer >60 >60 mL/min   Anion gap 7 5 - 15      Assessment & Plan:   Encounter Diagnoses  Name Primary?  . SOB (shortness of breath) Yes  . Tobacco use disorder   . Hyperlipidemia, unspecified hyperlipidemia type   . Gastroesophageal reflux disease, esophagitis presence not specified   . Abnormal CT of the chest       -reviewed labs with pt -pt to see dr Sherene SiresWert (pulmonologist) later this morning as scheduled.  -Urged pt to get application for [employer] [facility] completed and submitted ASAP -will  add omeprazole for GERD.  Gave reading information and counseled pt on lifestyle changes including avoiding eating at night and stopping smoking -counseled smoking cessation -discussed with pt that he is due to repeat CT.  Will defer to dr Sherene Sires as he may have some more specialized test  -pt to continue atorvastatin for lipids -pt to follow up 3 months.  RTO sooner prn

## 2018-11-26 NOTE — Patient Instructions (Signed)
Gastroesophageal Reflux Disease, Adult Gastroesophageal reflux (GER) happens when acid from the stomach flows up into the tube that connects the mouth and the stomach (esophagus). Normally, food travels down the esophagus and stays in the stomach to be digested. However, when a person has GER, food and stomach acid sometimes move back up into the esophagus. If this becomes a more serious problem, the person may be diagnosed with a disease called gastroesophageal reflux disease (GERD). GERD occurs when the reflux:  Happens often.  Causes frequent or severe symptoms.  Causes problems such as damage to the esophagus. When stomach acid comes in contact with the esophagus, the acid may cause soreness (inflammation) in the esophagus. Over time, GERD may create small holes (ulcers) in the lining of the esophagus. What are the causes? This condition is caused by a problem with the muscle between the esophagus and the stomach (lower esophageal sphincter, or LES). Normally, the LES muscle closes after food passes through the esophagus to the stomach. When the LES is weakened or abnormal, it does not close properly, and that allows food and stomach acid to go back up into the esophagus. The LES can be weakened by certain dietary substances, medicines, and medical conditions, including:  Tobacco use.  Pregnancy.  Having a hiatal hernia.  Alcohol use.  Certain foods and beverages, such as coffee, chocolate, onions, and peppermint. What increases the risk? You are more likely to develop this condition if you:  Have an increased body weight.  Have a connective tissue disorder.  Use NSAID medicines. What are the signs or symptoms? Symptoms of this condition include:  Heartburn.  Difficult or painful swallowing.  The feeling of having a lump in the throat.  Abitter taste in the mouth.  Bad breath.  Having a large amount of saliva.  Having an upset or bloated  stomach.  Belching.  Chest pain. Different conditions can cause chest pain. Make sure you see your health care provider if you experience chest pain.  Shortness of breath or wheezing.  Ongoing (chronic) cough or a night-time cough.  Wearing away of tooth enamel.  Weight loss. How is this diagnosed? Your health care provider will take a medical history and perform a physical exam. To determine if you have mild or severe GERD, your health care provider may also monitor how you respond to treatment. You may also have tests, including:  A test to examine your stomach and esophagus with a small camera (endoscopy).  A test thatmeasures the acidity level in your esophagus.  A test thatmeasures how much pressure is on your esophagus.  A barium swallow or modified barium swallow test to show the shape, size, and functioning of your esophagus. How is this treated? The goal of treatment is to help relieve your symptoms and to prevent complications. Treatment for this condition may vary depending on how severe your symptoms are. Your health care provider may recommend:  Changes to your diet.  Medicine.  Surgery. Follow these instructions at home: Eating and drinking   Follow a diet as recommended by your health care provider. This may involve avoiding foods and drinks such as: ? Coffee and tea (with or without caffeine). ? Drinks that containalcohol. ? Energy drinks and sports drinks. ? Carbonated drinks or sodas. ? Chocolate and cocoa. ? Peppermint and mint flavorings. ? Garlic and onions. ? Horseradish. ? Spicy and acidic foods, including peppers, chili powder, curry powder, vinegar, hot sauces, and barbecue sauce. ? Citrus fruit juices and citrus   fruits, such as oranges, lemons, and limes. ? Tomato-based foods, such as red sauce, chili, salsa, and pizza with red sauce. ? Fried and fatty foods, such as donuts, french fries, potato chips, and high-fat dressings. ? High-fat  meats, such as hot dogs and fatty cuts of red and white meats, such as rib eye steak, sausage, ham, and bacon. ? High-fat dairy items, such as whole milk, butter, and cream cheese.  Eat small, frequent meals instead of large meals.  Avoid drinking large amounts of liquid with your meals.  Avoid eating meals during the 2-3 hours before bedtime.  Avoid lying down right after you eat.  Do not exercise right after you eat. Lifestyle   Do not use any products that contain nicotine or tobacco, such as cigarettes, e-cigarettes, and chewing tobacco. If you need help quitting, ask your health care provider.  Try to reduce your stress by using methods such as yoga or meditation. If you need help reducing stress, ask your health care provider.  If you are overweight, reduce your weight to an amount that is healthy for you. Ask your health care provider for guidance about a safe weight loss goal. General instructions  Pay attention to any changes in your symptoms.  Take over-the-counter and prescription medicines only as told by your health care provider. Do not take aspirin, ibuprofen, or other NSAIDs unless your health care provider told you to do so.  Wear loose-fitting clothing. Do not wear anything tight around your waist that causes pressure on your abdomen.  Raise (elevate) the head of your bed about 6 inches (15 cm).  Avoid bending over if this makes your symptoms worse.  Keep all follow-up visits as told by your health care provider. This is important. Contact a health care provider if:  You have: ? New symptoms. ? Unexplained weight loss. ? Difficulty swallowing or it hurts to swallow. ? Wheezing or a persistent cough. ? A hoarse voice.  Your symptoms do not improve with treatment. Get help right away if you:  Have pain in your arms, neck, jaw, teeth, or back.  Feel sweaty, dizzy, or light-headed.  Have chest pain or shortness of breath.  Vomit and your vomit looks  like blood or coffee grounds.  Faint.  Have stool that is bloody or black.  Cannot swallow, drink, or eat. Summary  Gastroesophageal reflux happens when acid from the stomach flows up into the esophagus. GERD is a disease in which the reflux happens often, causes frequent or severe symptoms, or causes problems such as damage to the esophagus.  Treatment for this condition may vary depending on how severe your symptoms are. Your health care provider may recommend diet and lifestyle changes, medicine, or surgery.  Contact a health care provider if you have new or worsening symptoms.  Take over-the-counter and prescription medicines only as told by your health care provider. Do not take aspirin, ibuprofen, or other NSAIDs unless your health care provider told you to do so.  Keep all follow-up visits as told by your health care provider. This is important. This information is not intended to replace advice given to you by your health care provider. Make sure you discuss any questions you have with your health care provider. Document Released: 07/13/2005 Document Revised: 04/11/2018 Document Reviewed: 04/11/2018 Elsevier Interactive Patient Education  2019 [employer] Inc.  

## 2018-11-27 ENCOUNTER — Encounter: Payer: Self-pay | Admitting: Internal Medicine

## 2018-11-27 NOTE — Assessment & Plan Note (Signed)
Counseled re importance of smoking cessation but did not meet time criteria for separate billing   °

## 2018-11-27 NOTE — Assessment & Plan Note (Signed)
Active smoker -  Spirometry 11/26/2018  FEV1 3.4 (99%)  Ratio 0.74 with mild curvature s rx prior   - 11/26/2018   Walked RA  2 laps @  approx 221ft each @ avg pace  stopped due to  No sob or desats     Not able to reproduce his doe  Today and does not have significant copd at this point.    I reviewed the Fletcher curve with the patient that basically indicates  if you quit smoking when your best day FEV1 is still well preserved (as is clearly  the case here)  it is highly unlikely you will progress to severe disease and informed the patient there was  no medication on the market that has proven to alter the curve/ its downward trajectory  or the likelihood of progression of their disease(unlike other chronic medical conditions such as atheroclerosis where we do think we can change the natural hx with risk reducing meds)    Therefore stopping smoking and maintaining abstinence are  the most important aspects of care, not choice of inhalers or for that matter, doctors.   Treatment other than smoking cessation  is entirely directed by severity of symptoms and focused also on reducing exacerbations, not attempting to change the natural history of the disease and not needed at this point.

## 2018-11-27 NOTE — Assessment & Plan Note (Signed)
Ct chest 08/30/18 c/w spn sup segment RLL  - Repeat CT chest  11/26/2018 >>>   CT results reviewed with pt >>> Too small and not easily accessible to   bx, not suspicious enough for excisional bx > really only option for now is follow the Fleischner society guidelines as rec by radiology and repeat the scan at 3 months  Discussed in detail all the  indications, usual  risks and alternatives  relative to the benefits with patient who agrees to proceed with w/u as outlined.      Total time devoted to counseling  > 50 % of initial 45 min office visit:  review case with pt/ discussion of options/alternatives/ personally creating written customized instructions  in presence of pt  then going over those specific  Instructions directly with the pt including how to use all of the meds but in particular covering each new medication in detail and the difference between the maintenance= "automatic" meds and the prns using an action plan format for the latter (If this problem/symptom => do that organization reading Left to right).  Please see AVS from this visit for a full list of these instructions which I personally wrote for this pt and  are unique to this visit.

## 2018-11-29 ENCOUNTER — Other Ambulatory Visit: Payer: Self-pay

## 2018-11-29 ENCOUNTER — Emergency Department (HOSPITAL_COMMUNITY)
Admission: EM | Admit: 2018-11-29 | Discharge: 2018-11-29 | Disposition: A | Discharge status: Home / Self Care | Payer: Self-pay | Attending: Emergency Medicine | Admitting: Emergency Medicine

## 2018-11-29 ENCOUNTER — Encounter (HOSPITAL_COMMUNITY): Payer: Self-pay | Admitting: Emergency Medicine

## 2018-11-29 DIAGNOSIS — Z79899 Other long term (current) drug therapy: Secondary | ICD-10-CM | POA: Insufficient documentation

## 2018-11-29 DIAGNOSIS — F17210 Nicotine dependence, cigarettes, uncomplicated: Secondary | ICD-10-CM | POA: Insufficient documentation

## 2018-11-29 DIAGNOSIS — J45909 Unspecified asthma, uncomplicated: Secondary | ICD-10-CM | POA: Insufficient documentation

## 2018-11-29 DIAGNOSIS — T7840XA Allergy, unspecified, initial encounter: Secondary | ICD-10-CM | POA: Insufficient documentation

## 2018-11-29 LAB — BASIC METABOLIC PANEL
ANION GAP: 9 (ref 5–15)
BUN: 18 mg/dL (ref 6–20)
CALCIUM: 9.5 mg/dL (ref 8.9–10.3)
CHLORIDE: 105 mmol/L (ref 98–111)
CO2: 20 mmol/L — AB (ref 22–32)
Creatinine, Ser: 0.91 mg/dL (ref 0.61–1.24)
GFR calc Af Amer: >60 mL/min (ref >60)
GFR calc non Af Amer: >60 mL/min (ref >60)
GLUCOSE: 101 mg/dL — AB (ref 70–99)
Potassium: 3.4 mmol/L — ABNORMAL LOW (ref 3.5–5.1)
Sodium: 134 mmol/L — ABNORMAL LOW (ref 135–145)

## 2018-11-29 LAB — CBC WITH DIFFERENTIAL/PLATELET
Abs Immature Granulocytes: 0.03 10*3/uL (ref 0.00–0.07)
Basophils Absolute: 0 10*3/uL (ref 0.0–0.1)
Basophils Relative: 0 %
EOS ABS: 0.2 10*3/uL (ref 0.0–0.5)
EOS PCT: 2 %
HEMATOCRIT: 45.1 % (ref 39.0–52.0)
Hemoglobin: 15.4 g/dL (ref 13.0–17.0)
IMMATURE GRANULOCYTES: 0 %
LYMPHS ABS: 2.6 10*3/uL (ref 0.7–4.0)
Lymphocytes Relative: 29 %
MCH: 29.6 pg (ref 26.0–34.0)
MCHC: 34.1 g/dL (ref 30.0–36.0)
MCV: 86.6 fL (ref 80.0–100.0)
MONOS PCT: 7 %
Monocytes Absolute: 0.6 10*3/uL (ref 0.1–1.0)
Neutro Abs: 5.4 10*3/uL (ref 1.7–7.7)
Neutrophils Relative %: 62 %
Platelets: 356 10*3/uL (ref 150–400)
RBC: 5.21 MIL/uL (ref 4.22–5.81)
RDW: 13 % (ref 11.5–15.5)
WBC: 8.8 10*3/uL (ref 4.0–10.5)
nRBC: 0 % (ref 0.0–0.2)

## 2018-11-29 MED ORDER — FAMOTIDINE 20 MG PO TABS: ORAL_TABLET | ORAL | 0 refills | Status: DC

## 2018-11-29 MED ORDER — FAMOTIDINE IN NACL 20-0.9 MG/50ML-% IV SOLN
20.0000 mg | Freq: Once | INTRAVENOUS | Status: AC
  Administered 2018-11-29: 20 mg via INTRAVENOUS
  Filled 2018-11-29: qty 50

## 2018-11-29 MED ORDER — ALUM & MAG HYDROXIDE-SIMETH 200-200-20 MG/5ML PO SUSP
15.0000 mL | Freq: Once | ORAL | Status: AC
  Administered 2018-11-29: 15 mL via ORAL
  Filled 2018-11-29: qty 30

## 2018-11-29 MED ORDER — DIPHENHYDRAMINE HCL 50 MG/ML IJ SOLN
25.0000 mg | Freq: Once | INTRAMUSCULAR | Status: AC
  Administered 2018-11-29: 25 mg via INTRAVENOUS
  Filled 2018-11-29: qty 1

## 2018-11-29 MED ORDER — PREDNISONE 10 MG PO TABS: ORAL_TABLET | ORAL | 0 refills | Status: DC

## 2018-11-29 MED ORDER — DEXAMETHASONE SODIUM PHOSPHATE 10 MG/ML IJ SOLN
10.0000 mg | Freq: Once | INTRAMUSCULAR | Status: AC
  Administered 2018-11-29: 10 mg via INTRAVENOUS
  Filled 2018-11-29: qty 1

## 2018-11-29 MED ORDER — DIPHENHYDRAMINE HCL 25 MG PO TABS: 25.0000 mg | ORAL_TABLET | Freq: Four times a day (QID) | ORAL | 0 refills | Status: DC

## 2018-11-29 NOTE — ED Triage Notes (Signed)
Patient states he started a medication for GERD today (omeprazole). Has widespread rash. Denies difficulty breathing. Rash started within last hour to hour and a half.

## 2018-11-29 NOTE — Discharge Instructions (Addendum)
Use the medicines as prescribed, take one more dose of benadryl before bed tonight, your next pepcid tomorrow morning and your prednisone (tapering dose) daily with your evening meal (also take tomorrow for first dose).  Get rechecked for any return of symptoms.

## 2018-11-29 NOTE — ED Provider Notes (Signed)
[facility] EMERGENCY DEPARTMENT Provider Note   CSN: [id number] Arrival date & time: 11/29/18  1625     History   Chief Complaint Chief Complaint  Patient presents with  . Allergic Reaction    HPI Jake BatheMonte Colla is a 53 y.o. male.  The history is provided by the patient.  Allergic Reaction  Presenting symptoms: itching and rash   Presenting symptoms: no difficulty breathing, no difficulty swallowing and no wheezing   Severity:  Moderate Duration:  3 hours Prior allergic episodes:  No prior episodes Context: medications   Context comment:  Pt took his first dose of prescribed omeprazole 1 hour before development of rash and itching. Relieved by:  None tried Worsened by:  Nothing Ineffective treatments:  None tried   Past Medical History:  Diagnosis Date  . Anxiety   . Asthma    as child-now with allergy season only.  . Depression   . ETOH abuse   . Hyperlipidemia   . Kidney calculi   . Low back pain   . Pneumonia   . Polysubstance abuse (HCC)   . PTSD (post-traumatic stress disorder)     Patient Active Problem List   Diagnosis Date Noted  . DOE (dyspnea on exertion) 11/26/2018  . Solitary pulmonary nodule on lung CT 11/26/2018  . Hyperlipidemia 02/10/2016  . Low back pain 02/10/2016  . Cigarette nicotine dependence with nicotine-induced disorder 02/10/2016  . Chronic pain 01/15/2016  . Anxiety disorder 01/15/2016  . Polysubstance abuse (HCC) 04/06/2015  . Substance induced mood disorder (HCC) 04/06/2015  . Homicidal ideation   . MRSA infection 12/24/2012  . Cigarette smoker 12/24/2012  . Habitual alcohol use 12/24/2012  . Finger osteomyelitis, right (HCC) 11/26/2012    Past Surgical History:  Procedure Laterality Date  . BACK SURGERY    . HYDROCELE EXCISION Left 08/01/2013   Procedure: HYDROCELECTOMY ADULT;  Surgeon: Ky BarbanMohammad I Javaid, MD;  Location: [facility];  Service: Urology;  Laterality: Left;  . I&D EXTREMITY  10/24/2012   Procedure: IRRIGATION  AND DEBRIDEMENT EXTREMITY;  Surgeon: Tami RibasKevin R Kuzma, MD;  Location: [facility];  Service: Orthopedics;  Laterality: Right;  . OPEN REDUCTION INTERNAL FIXATION (ORIF) DISTAL RADIAL FRACTURE Right 05/16/2015   Procedure: OPEN REDUCTION INTERNAL FIXATION (ORIF) RIGHT DISTAL RADIUS FRACTURE;  Surgeon: Dairl PonderMatthew Weingold, MD;  Location: [facility];  Service: Orthopedics;  Laterality: Right;  . OPEN REDUCTION INTERNAL FIXATION (ORIF) HAND Right thumb  . OPEN REDUCTION INTERNAL FIXATION (ORIF) METACARPAL Left 05/16/2015   Procedure: OPEN REDUCTION INTERNAL FIXATION OF MIDDLE PHALANGEAL FRACTURE, EXTENSOR TENDON REPAIR WITH ULNAR DIGITAL NERVE REPAIR MICROSCOPICALLY;  Surgeon: Dairl PonderMatthew Weingold, MD;  Location: [facility] OR;  Service: Orthopedics;  Laterality: Left;  . SHOULDER ARTHROSCOPY WITH ROTATOR CUFF REPAIR AND SUBACROMIAL DECOMPRESSION Left 06/08/2016   Procedure: LEFT SHOULDER ARTHROSCOPY WITH ROTATOR CUFF REPAIR, SUBACROMIAL DECOMPRESSION, DISTAL CLAVICLE EXCISION;  Surgeon: Tarry KosNaiping M Xu, MD;  Location: [facility];  Service: Orthopedics;  Laterality: Left;  . THUMB FUSION Right         Home Medications    Prior to Admission medications   Medication Sig Start Date End Date Taking? Authorizing Provider  albuterol (PROVENTIL HFA;VENTOLIN HFA) 108 (90 Base) MCG/ACT inhaler Inhale 2 puffs into the lungs every 6 (six) hours as needed for wheezing or shortness of breath. 09/18/18   Jacquelin HawkingMcElroy, Shannon, PA-C  atorvastatin (LIPITOR) 20 MG tablet TAKE 1 Tablet BY MOUTH ONCE DAILY 05/14/18   Jacquelin HawkingMcElroy, Shannon, PA-C  Calcium Carb-Cholecalciferol (CALCIUM 1000 + D  PO) Take 1 tablet by mouth 2 (two) times daily.     [provider]  diphenhydrAMINE (BENADRYL) 25 MG tablet Take 1 tablet (25 mg total) by mouth every 6 (six) hours. 11/29/18   Burgess Amor, PA-C  famotidine (PEPCID) 20 MG tablet Take one tablet twice daily for 5 days then as needed for acid reflux symptoms. 11/29/18   Burgess Amor, PA-C  naproxen sodium  (ALEVE) 220 MG tablet Take 220 mg by mouth.    [provider]  omeprazole (PRILOSEC) 40 MG capsule Take 1 capsule (40 mg total) by mouth daily. 11/26/18   [name], PA-C  predniSONE (DELTASONE) 10 MG tablet Take 6 tabs daily by mouth on day 1 (Friday),  Then 5 tabs daily for 2 days,  4 tabs daily for 2 days,  3 tabs daily for 2 days,  2 tabs daily for 2 days,  Then 1 tab daily for 2 days. 11/29/18   Burgess Amor, PA-C    Family History Family History  Problem Relation Age of Onset  . Diabetes Mother   . Hypertension Mother   . Obesity Mother   . Heart disease Mother   . Kidney disease Mother   . COPD Mother   . Cancer Father     Social History Social History   Tobacco Use  . Smoking status: Current Every Day Smoker    Packs/day: 1.00    Years: 44.00    Pack years: 44.00    Types: Cigarettes    Start date: 01/29/1973  . Smokeless tobacco: Former Neurosurgeon    Types: Chew  Substance Use Topics  . Alcohol use: Yes    Alcohol/week: 8.0 standard drinks    Types: 8 Cans of beer per week    Comment: hx heavy etoh august 2016  . Drug use: Yes    Types: Marijuana, Cocaine, Other-see comments    Comment: none since august 2016     Allergies   Penicillins and Prilosec [omeprazole]   Review of Systems Review of Systems  Constitutional: Negative for chills and fever.  HENT: Negative for trouble swallowing.   Respiratory: Negative for cough, shortness of breath and wheezing.   Gastrointestinal: Negative.  Negative for nausea and vomiting.  Musculoskeletal: Negative.   Skin: Positive for itching and rash.  Neurological: Negative for numbness.     Physical Exam Updated Vital Signs BP 135/71 (BP Location: Right Arm)   Pulse 98   Temp 97.7 F (36.5 C) (Oral)   Resp 18   Ht 5\' 7"  (1.702 m)   Wt 83.9 kg   SpO2 94%   BMI 28.98 kg/m   Physical Exam Constitutional:      General: He is not in acute distress.    Appearance: Normal appearance. He is  well-developed.  HENT:     Head: Normocephalic.  Neck:     Musculoskeletal: Neck supple.  Cardiovascular:     Rate and Rhythm: Normal rate.  Pulmonary:     Effort: Pulmonary effort is normal.     Breath sounds: No stridor. No wheezing or rhonchi.  Musculoskeletal: Normal range of motion.  Skin:    Findings: Erythema and rash present.     Comments: Generalized erythema of the trunk neck and all 4 extremities.  There are scattered sparse areas of slightly raised urticarial lesions on his back.  Excoriations present.  Neurological:     Mental Status: He is alert.      ED Treatments / Results  Labs (all  labs ordered are listed, but only abnormal results are displayed) Labs Reviewed  BASIC METABOLIC PANEL - Abnormal; Notable for the following components:      Result Value   Sodium 134 (*)    Potassium 3.4 (*)    CO2 20 (*)    Glucose, Bld 101 (*)    All other components within normal limits  CBC WITH DIFFERENTIAL/PLATELET    EKG None  Radiology No results found.  Procedures Procedures (including critical care time)  Medications Ordered in ED Medications  dexamethasone (DECADRON) injection 10 mg (10 mg Intravenous Given 11/29/18 1719)  diphenhydrAMINE (BENADRYL) injection 25 mg (25 mg Intravenous Given 11/29/18 1718)  famotidine (PEPCID) IVPB 20 mg premix (0 mg Intravenous Stopped 11/29/18 1749)  alum & mag hydroxide-simeth (MAALOX/MYLANTA) 200-200-20 MG/5ML suspension 15 mL (15 mLs Oral Given 11/29/18 1840)     Initial Impression / Assessment and Plan / ED Course  I have reviewed the triage vital signs and the nursing notes.  Pertinent labs & imaging results that were available during my care of the patient were reviewed by me and considered in my medical decision making (see chart for details).     Patient with new allergic reaction to omeprazole.  He endorses he has never taken this medication before and symptoms began an hour after his first dose.  He was  symptom-free at the time of discharge.  He was given IV Decadron, Benadryl and Pepcid.  He endorsed having some dyspepsia-like symptoms at recheck, denied chest pain or shortness of breath but stated he felt like he needed to burp.  He was given a dose of Maalox and the symptom completely resolved.  He was prescribed a 12-day prednisone taper, Benadryl and Pepcid.  Advised to not take any further omeprazole, may substitute Maalox or Tums which she has used in the past if needed for breakthrough symptoms.  Also advised follow-up with his PCP for further management of his GERD and to advise of this allergic reaction.  Strict return precautions were discussed.  Final Clinical Impressions(s) / ED Diagnoses   Final diagnoses:  Allergic reaction, initial encounter    ED Discharge Orders         Ordered    diphenhydrAMINE (BENADRYL) 25 MG tablet  Every 6 hours     11/29/18 1917    famotidine (PEPCID) 20 MG tablet     11/29/18 1917    predniSONE (DELTASONE) 10 MG tablet     11/29/18 1917           Burgess Amor, Cordelia Poche 11/29/18 1931    Bethann Berkshire, MD 11/29/18 2210

## 2018-12-04 ENCOUNTER — Ambulatory Visit (HOSPITAL_COMMUNITY)
Admission: RE | Admit: 2018-12-04 | Discharge: 2018-12-04 | Disposition: A | Discharge status: Home / Self Care | Payer: Self-pay | Source: Ambulatory Visit | Attending: Internal Medicine | Admitting: Internal Medicine

## 2018-12-04 DIAGNOSIS — R911 Solitary pulmonary nodule: Secondary | ICD-10-CM | POA: Insufficient documentation

## 2018-12-05 NOTE — Progress Notes (Signed)
Spoke with pt and notified of results per Dr. Wert. Pt verbalized understanding and denied any questions. 

## 2019-01-11 ENCOUNTER — Other Ambulatory Visit: Payer: Self-pay

## 2019-01-11 ENCOUNTER — Ambulatory Visit (INDEPENDENT_AMBULATORY_CARE_PROVIDER_SITE_OTHER): Payer: Self-pay | Admitting: Internal Medicine

## 2019-01-11 ENCOUNTER — Telehealth: Payer: Self-pay | Admitting: *Deleted

## 2019-01-11 ENCOUNTER — Encounter: Payer: Self-pay | Admitting: Internal Medicine

## 2019-01-11 DIAGNOSIS — R0600 Dyspnea, unspecified: Secondary | ICD-10-CM

## 2019-01-11 DIAGNOSIS — F17210 Nicotine dependence, cigarettes, uncomplicated: Secondary | ICD-10-CM

## 2019-01-11 DIAGNOSIS — R918 Other nonspecific abnormal finding of lung field: Secondary | ICD-10-CM

## 2019-01-11 DIAGNOSIS — R911 Solitary pulmonary nodule: Secondary | ICD-10-CM

## 2019-01-11 DIAGNOSIS — R05 Cough: Secondary | ICD-10-CM

## 2019-01-11 DIAGNOSIS — R0609 Other forms of dyspnea: Principal | ICD-10-CM

## 2019-01-11 MED ORDER — FAMOTIDINE 20 MG PO TABS: ORAL_TABLET | ORAL | Status: DC

## 2019-01-11 MED ORDER — PREDNISONE 10 MG PO TABS: ORAL_TABLET | ORAL | 0 refills | Status: DC

## 2019-01-11 NOTE — Progress Notes (Signed)
Virtual Visit via Telephone Note  I connected with Jacob Reilly on 01/11/19 at  1:30 PM EDT by telephone and verified that I am speaking with the correct person using two identifiers.   I discussed the limitations, risks, security and privacy concerns of performing an evaluation and management service by telephone and the availability of in person appointments. I also discussed with the patient that there may be a patient responsible charge related to this service. The patient expressed understanding and agreed to proceed.   History of Present Illness:  61 yowm active smoker told asthma as child but no recollection of any symtoms and ok by HS with sports  then by 20's started needing inhalers but just albuterol avg 2-3 x per weekly but they stopped working as well since winter 2019 limited by back and filed for disability but still waiting to hear and in meantime worse sob x 06/2018 > abn cxr 08/24/18 > CT chest 08/30/18 with spn  So referred to pulmonary clinic 11/26/2018 by Carollee Herter McElrow/ PA   Has had multiple traumatic injuries  esp to R shoulder and chest in 05/2016 with multiple R Rib fx    History of Present Illness  11/26/2018  Pulmonary/ 1st office eval/Carollyn Etcheverry  Chief Complaint  Patient presents with  . Pulmonary Consult    Referred by Dr. [name] for eval of pulmonary nodule and SOB. Pt c/o DOE for the past 1-2 years. He gets winded walking approx 200 ft. He is coughing also-non prod.  Dyspnea:  Gradually worse x 1 year to [facility] = can't walk 100 yards even at a slow pace at a flat grade s stopping due to sob   Cough: min esp in am, nothing bloody Sleep: sometimes cough wakes him  SABA use: none on day of ov/ avg 2-3 x per week   W/u:  -  Spirometry 11/26/2018  FEV1 3.4 (99%)  Ratio 0.74 with mild curvature s rx prior   - 11/26/2018   [facility] RA  2 laps @  approx 273ft each @ avg pace  stopped due to  No sob or desats   Ct chest 08/30/18 c/w spn sup segment RLL  - Repeat CT chest   12/05/2018  1. Pleuroparenchymal scarring in the right middle and right lower lobes. New areas of adjacent peribronchovascular consolidation in the right lower are most likely infectious or inflammatory in etiology. 2. Nodule along the medial aspect of the right major fissure has an internal calcification and may represent a granuloma. Additional follow-up CT chest without contrast could be obtained in 12-18 months to ensure continued stability, as clinically indicated.  rec Only use your albuterol as a rescue medication       01/11/2019  f/u by televist due to corona restrictions  Dyspnea:  MMRC2 = can't walk a nl pace on a flat grade s sob but does fine slow and flat  Cough: no change to slt worse minimal mucus  Sleeping: on back couple of pillows / coughs wakes him up most nights/ worse than day  SABA use: 2-3 x weekly      No obvious day to day or daytime variability or assoc excess/ purulent sputum or mucus plugs or hemoptysis or cp or chest tightness, subjective wheeze or overt sinus or hb symptoms.   Also denies any obvious fluctuation of symptoms with weather or environmental changes or other aggravating or alleviating factors except as outlined above   No unusual exposure hx or h/o childhood pna/ asthma  or knowledge of premature birth.  Current Allergies, Complete Past Medical History, Past Surgical History, Family History, and Social History were reviewed in [employer] record.  ROS  The following are not active complaints unless bolded Hoarseness, sore throat, dysphagia, dental problems, itching, sneezing,  nasal congestion or discharge of excess mucus or purulent secretions, ear ache,   fever, chills, sweats, unintended wt loss or wt gain, classically pleuritic or exertional cp,  orthopnea pnd or arm/hand swelling  or leg swelling, presyncope, palpitations, abdominal pain, anorexia, nausea, vomiting, diarrhea  or change in bowel habits or change in  bladder habits, change in stools or change in urine, dysuria, hematuria,  rash, arthralgias, visual complaints, headache, numbness, weakness or ataxia or problems with walking or coordination,  change in mood or  memory.            Observations/Objective: Good voice texture/ no coughing    Assessment and Plan:  1) DOE better than baseline s need for maint rx/ inhalers or 02  > no change rx  For now   2) cough worse/ still smoking and not taking pepcid regularly p reaction to ppi (rash)  - rec try h2 bid/ stop smoking and regroup in 4 months with pfts given chronicity of symptoms and unable to use ppi here   3) Abd R base on ct since 12/04/17  - f/u cxr on return   4) Active smoking:   Counseled re importance of smoking cessation but did not meet time criteria for separate billing          Follow Up Instructions:    I discussed the assessment and treatment plan with the patient. The patient was provided an opportunity to ask questions and all were answered. The patient agreed with the plan and demonstrated an understanding of the instructions.   The patient was advised to call back or seek an in-person evaluation if the symptoms worsen or if the condition fails to improve as anticipated.  I provided 25 minutes of non-face-to-face time during this encounter.   Sandrea Hughs, MD

## 2019-01-11 NOTE — Patient Instructions (Addendum)
Be sure to continue pepcid 20 mg after bfast and supper  The key is to stop smoking completely before smoking completely stops you! - For smoking cessation classes/counseling call [phone]   If breathing getting worse >  Prednisone 10 mg take  4 each am x 2 days,   2 each am x 2 days,  1 each am x 2 days and stop    Needs a follow up office visit in 4 months with pfts - we'll see in meantime if get worse.  - needs plain cxr on return also

## 2019-01-11 NOTE — Telephone Encounter (Signed)
-----   Message from Nyoka Cowden, MD sent at 01/11/2019  1:47 PM EDT ----- Be sure he understands the pepcid should be bid taken after bfast and after supper and set him up for ov 4 m with pfts call sooner prn

## 2019-01-11 NOTE — Telephone Encounter (Signed)
Called and spoke with the pt and notified of recs per MW  He verbalized understanding  PFT and ov already scheduled

## 2019-01-14 ENCOUNTER — Ambulatory Visit: Payer: Self-pay | Admitting: Internal Medicine

## 2019-02-25 ENCOUNTER — Ambulatory Visit: Payer: Worker's Compensation | Admitting: Physician Assistant

## 2019-02-25 ENCOUNTER — Encounter: Payer: Self-pay | Admitting: Physician Assistant

## 2019-02-25 DIAGNOSIS — F17200 Nicotine dependence, unspecified, uncomplicated: Secondary | ICD-10-CM

## 2019-02-25 DIAGNOSIS — R0602 Shortness of breath: Secondary | ICD-10-CM

## 2019-02-25 MED ORDER — FAMOTIDINE 20 MG PO TABS: ORAL_TABLET | ORAL | 11 refills | Status: DC

## 2019-02-25 NOTE — Progress Notes (Signed)
   There were no vitals taken for this visit.   Subjective:    Patient ID: Jacob Reilly, male    DOB: Jan 16, 1966, 53 y.o.   MRN: [id number]  HPI: Jacob Reilly is a 53 y.o. male presenting on 02/25/2019 for No chief complaint on file.   HPI   This is a telemedicine appointment due to coronavirus pandemic.  It is via Telephone as pt does not have a smartphone  I connected with  Jacob Reilly on 02/25/19 by a video enabled telemedicine application and verified that I am speaking with the correct person using two identifiers.   I discussed the limitations of evaluation and management by telemedicine. The patient expressed understanding and agreed to proceed.  Pt got pepcid in ER when he was allergic to omeprazole.    He says he is Mostly staying home.  He wears mask when he goes to the store  He is still smoking.    He says he is doing well.   He has No depression, anxiety fever or changes in cough.  He currently Uses his inhaler 2 or 3 times/week.   Relevant past medical, surgical, family and social history reviewed and updated as indicated. Interim medical history since our last visit reviewed. Allergies and medications reviewed and updated.   Current Outpatient Medications:  .  albuterol (PROVENTIL HFA;VENTOLIN HFA) 108 (90 Base) MCG/ACT inhaler, Inhale 2 puffs into the lungs every 6 (six) hours as needed for wheezing or shortness of breath., Disp: 1 Inhaler, Rfl: 4 .  atorvastatin (LIPITOR) 20 MG tablet, TAKE 1 Tablet BY MOUTH ONCE DAILY, Disp: 90 tablet, Rfl: 3 .  Calcium Carb-Cholecalciferol (CALCIUM 1000 + D PO), Take 1 tablet by mouth 2 (two) times daily. , Disp: , Rfl:  .  naproxen sodium (ALEVE) 220 MG tablet, Take 220 mg by mouth., Disp: , Rfl:  .  famotidine (PEPCID) 20 MG tablet, Take one twice daily after bfast and supper (Patient not taking: Reported on 02/25/2019), Disp: , Rfl:     Review of Systems  Per HPI unless specifically indicated above     Objective:     There were no vitals taken for this visit.  Wt Readings from Last 3 Encounters:  11/29/18 185 lb (83.9 kg)  11/26/18 184 lb (83.5 kg)  11/26/18 185 lb 8 oz (84.1 kg)    Physical Exam Pulmonary:     Effort: Pulmonary effort is normal. No respiratory distress.  Neurological:     Mental Status: He is alert and oriented to person, place, and time.  Psychiatric:        Attention and Perception: Attention normal.        Mood and Affect: Mood normal.        Speech: Speech normal.        Behavior: Behavior is cooperative.            Assessment & Plan:    Encounter Diagnoses  Name Primary?  . Tobacco use disorder Yes  . SOB (shortness of breath)    -Refilled pepcid -no labs at this time -pt counseled to Exercise regularly -encouraged pt to use Face mask whenever he goes to store -counseled Smoking cessation -pt to Follow up 3 months.  Pt to contact office sooner prn

## 2019-04-30 ENCOUNTER — Encounter: Payer: Self-pay | Admitting: Internal Medicine

## 2019-05-13 ENCOUNTER — Other Ambulatory Visit: Payer: Self-pay | Admitting: Physician Assistant

## 2019-05-13 ENCOUNTER — Ambulatory Visit: Payer: Self-pay | Admitting: Internal Medicine

## 2019-05-13 DIAGNOSIS — E785 Hyperlipidemia, unspecified: Secondary | ICD-10-CM

## 2019-05-17 ENCOUNTER — Other Ambulatory Visit: Payer: Self-pay | Admitting: Physician Assistant

## 2019-05-20 ENCOUNTER — Other Ambulatory Visit (HOSPITAL_COMMUNITY)
Admission: RE | Admit: 2019-05-20 | Discharge: 2019-05-20 | Disposition: A | Discharge status: Home / Self Care | Payer: Self-pay | Source: Ambulatory Visit | Attending: Physician Assistant | Admitting: Physician Assistant

## 2019-05-20 DIAGNOSIS — E785 Hyperlipidemia, unspecified: Secondary | ICD-10-CM | POA: Insufficient documentation

## 2019-05-20 LAB — COMPREHENSIVE METABOLIC PANEL
ALT: 34 U/L (ref 0–44)
AST: 37 U/L (ref 15–41)
Albumin: 4.3 g/dL (ref 3.5–5.0)
Alkaline Phosphatase: 77 U/L (ref 38–126)
Anion gap: 9 (ref 5–15)
BUN: 22 mg/dL — ABNORMAL HIGH (ref 6–20)
CO2: 25 mmol/L (ref 22–32)
Calcium: 9 mg/dL (ref 8.9–10.3)
Chloride: 104 mmol/L (ref 98–111)
Creatinine, Ser: 0.76 mg/dL (ref 0.61–1.24)
GFR calc Af Amer: >60 mL/min (ref >60)
GFR calc non Af Amer: >60 mL/min (ref >60)
Glucose, Bld: 99 mg/dL (ref 70–99)
Potassium: 4.5 mmol/L (ref 3.5–5.1)
Sodium: 138 mmol/L (ref 135–145)
Total Bilirubin: 0.7 mg/dL (ref 0.3–1.2)
Total Protein: 6.7 g/dL (ref 6.5–8.1)

## 2019-05-20 LAB — LIPID PANEL
Cholesterol: 114 mg/dL (ref 0–200)
HDL: 69 mg/dL (ref >40)
LDL Cholesterol: 39 mg/dL (ref 0–99)
Total CHOL/HDL Ratio: 1.7 RATIO
Triglycerides: 29 mg/dL (ref <150)
VLDL: 6 mg/dL (ref 0–40)

## 2019-05-21 ENCOUNTER — Ambulatory Visit: Payer: Worker's Compensation | Admitting: Physician Assistant

## 2019-05-21 ENCOUNTER — Encounter: Payer: Self-pay | Admitting: Physician Assistant

## 2019-05-21 DIAGNOSIS — F17200 Nicotine dependence, unspecified, uncomplicated: Secondary | ICD-10-CM

## 2019-05-21 DIAGNOSIS — R0602 Shortness of breath: Secondary | ICD-10-CM

## 2019-05-21 DIAGNOSIS — E785 Hyperlipidemia, unspecified: Secondary | ICD-10-CM

## 2019-05-21 DIAGNOSIS — K219 Gastro-esophageal reflux disease without esophagitis: Secondary | ICD-10-CM

## 2019-05-21 NOTE — Progress Notes (Signed)
There were no vitals taken for this visit.   Subjective:    Patient ID: Jacob Reilly, male    DOB: May 08, 1966, 53 y.o.   MRN: [id number]  HPI: Jacob Reilly is a 54 y.o. male presenting on 05/21/2019 for No chief complaint on file.   HPI  This is a telemedicine appointment due to coronavirus pandemic.  It is via Telephone as pt does not have a smartphone.  I connected with  Jacob Reilly on 05/21/19 by a video enabled telemedicine application and verified that I am speaking with the correct person using two identifiers.   I discussed the limitations of evaluation and management by telemedicine. The patient expressed understanding and agreed to proceed.  Pt just left home and is a passenger in a car headed to the [place] area for a job.  Provider is at office   He is still smoking.  He is not using his inhaler due to being instructed by pulmonologist to stop using the inhaler.  He says he never got the pepcid prescription filled.  He was prescribed pepcid after he went to ER for allergy to omeprazole.  Pt was scheduled for PFTs in July and it appears that he was contacted in reference to making arrangements for testing but didn't answer the phone.  Pt says he got letter from pulmonologist but hasn't called them back yet  He says he is trying to get get back to work.  He climbs towers.   He has problems with that due to pain. he is feeling stressed.  He is having stress trying to get back to work.  He says he "sat on his butt for 7 years".   But He is working again- leaving town today- headed for [place] area.     Pt is hoping to get insurance again if things work out with his employement  Pt has spinal stenosis and has been seen by specialists for this.    Pt says he is having wheezing.  He is still smoking.   Relevant past medical, surgical, family and social history reviewed and updated as indicated. Interim medical history since our last visit reviewed. Allergies and medications reviewed  and updated.   Current Outpatient Medications:  .  atorvastatin (LIPITOR) 20 MG tablet, TAKE 1 Tablet BY MOUTH ONCE DAILY, Disp: 90 tablet, Rfl: 3 .  Calcium Carb-Cholecalciferol (CALCIUM 1000 + D PO), Take 1 tablet by mouth 2 (two) times daily. , Disp: , Rfl:  .  Multiple Vitamin (MULTIVITAMIN) tablet, Take 1 tablet by mouth daily., Disp: , Rfl:  .  naproxen sodium (ALEVE) 220 MG tablet, Take 220 mg by mouth., Disp: , Rfl:  .  albuterol (PROVENTIL HFA;VENTOLIN HFA) 108 (90 Base) MCG/ACT inhaler, Inhale 2 puffs into the lungs every 6 (six) hours as needed for wheezing or shortness of breath. (Patient not taking: Reported on 05/21/2019), Disp: 1 Inhaler, Rfl: 4 .  famotidine (PEPCID) 20 MG tablet, Take one twice daily after bfast and supper (Patient not taking: Reported on 05/21/2019), Disp: 60 tablet, Rfl: 11    Review of Systems  Per HPI unless specifically indicated above     Objective:    There were no vitals taken for this visit.  Wt Readings from Last 3 Encounters:  11/29/18 185 lb (83.9 kg)  11/26/18 184 lb (83.5 kg)  11/26/18 185 lb 8 oz (84.1 kg)    Physical Exam Pulmonary:     Effort: No respiratory distress.  Neurological:  Mental Status: He is alert and oriented to person, place, and time.  Psychiatric:        Attention and Perception: Attention normal.        Speech: Speech normal.        Behavior: Behavior is cooperative.     Results for orders placed or performed during the hospital encounter of 05/20/19  Comprehensive metabolic panel  Result Value Ref Range   Sodium 138 135 - 145 mmol/L   Potassium 4.5 3.5 - 5.1 mmol/L   Chloride 104 98 - 111 mmol/L   CO2 25 22 - 32 mmol/L   Glucose, Bld 99 70 - 99 mg/dL   BUN 22 (H) 6 - 20 mg/dL   Creatinine, Ser 1.610.76 0.61 - 1.24 mg/dL   Calcium 9.0 8.9 - 09.610.3 mg/dL   Total Protein 6.7 6.5 - 8.1 g/dL   Albumin 4.3 3.5 - 5.0 g/dL   AST 37 15 - 41 U/L   ALT 34 0 - 44 U/L   Alkaline Phosphatase 77 38 - 126 U/L   Total  Bilirubin 0.7 0.3 - 1.2 mg/dL   GFR calc non Af Amer >60 >60 mL/min   GFR calc Af Amer >60 >60 mL/min   Anion gap 9 5 - 15  Lipid panel  Result Value Ref Range   Cholesterol 114 0 - 200 mg/dL   Triglycerides 29 <045<150 mg/dL   HDL 69 >40>40 mg/dL   Total CHOL/HDL Ratio 1.7 RATIO   VLDL 6 0 - 40 mg/dL   LDL Cholesterol 39 0 - 99 mg/dL      Assessment & Plan:    Encounter Diagnoses  Name Primary?  . Hyperlipidemia, unspecified hyperlipidemia type Yes  . Tobacco use disorder   . SOB (shortness of breath)   . Gastroesophageal reflux disease, esophagitis presence not specified     -reviewed labs with pt -no changes to medications today -encouraged smoking cessation -encouraged pt to Call pulmonologist- both to reschedule PFTs and to confirm instructions on inhaler.  Discussed with him that he might have just been instructed to avoid the inhaler just before the testing.   -pt to Follow up 3 months.  He is to contact office sooner prn

## 2019-07-15 ENCOUNTER — Ambulatory Visit: Payer: Worker's Compensation | Admitting: Physician Assistant

## 2019-08-06 ENCOUNTER — Other Ambulatory Visit: Payer: Self-pay | Admitting: Physician Assistant

## 2019-08-06 DIAGNOSIS — Z125 Encounter for screening for malignant neoplasm of prostate: Secondary | ICD-10-CM

## 2019-08-06 DIAGNOSIS — E785 Hyperlipidemia, unspecified: Secondary | ICD-10-CM

## 2019-08-21 ENCOUNTER — Ambulatory Visit: Payer: Worker's Compensation | Admitting: Physician Assistant

## 2019-09-03 ENCOUNTER — Encounter: Payer: Self-pay | Admitting: Physician Assistant

## 2019-09-03 ENCOUNTER — Ambulatory Visit: Payer: Worker's Compensation | Admitting: Physician Assistant

## 2019-09-03 DIAGNOSIS — F17200 Nicotine dependence, unspecified, uncomplicated: Secondary | ICD-10-CM

## 2019-09-03 DIAGNOSIS — R911 Solitary pulmonary nodule: Secondary | ICD-10-CM

## 2019-09-03 DIAGNOSIS — R059 Cough, unspecified: Secondary | ICD-10-CM

## 2019-09-03 DIAGNOSIS — E785 Hyperlipidemia, unspecified: Secondary | ICD-10-CM

## 2019-09-03 DIAGNOSIS — R05 Cough: Secondary | ICD-10-CM

## 2019-09-03 MED ORDER — ATORVASTATIN CALCIUM 20 MG PO TABS: 20.0000 mg | ORAL_TABLET | Freq: Every day | ORAL | 0 refills | Status: DC

## 2019-09-03 NOTE — Progress Notes (Signed)
There were no vitals taken for this visit.   Subjective:    Patient ID: Jacob Reilly, male    DOB: 1966-05-15, 53 y.o.   MRN: [id number]  HPI: Kayvon Mo is a 53 y.o. male presenting on 09/03/2019 for No chief complaint on file.   HPI   This is a telemedicine appointment through [employer] due to coronavirus pandemic. [employer]  I connected with  Jacob Reilly on 09/03/19 by a video enabled telemedicine application and verified that I am speaking with the correct person using two identifiers.   I discussed the limitations of evaluation and management by telemedicine. The patient expressed understanding and agreed to proceed.   Patient is at home.  Provider is at office.    Patient reports that he has been coughing a lot more recently.  He says that he has been coughing for about 2 years now..    Pt continues to smoke.   He has an inhaler but doesn't use it.   He is working at [employer]  Didn't get labs-  Couldn't get up there- he says he has no time    Relevant past medical, surgical, family and social history reviewed and updated as indicated. Interim medical history since our last visit reviewed. Allergies and medications reviewed and updated.   Current Outpatient Medications:  .  atorvastatin (LIPITOR) 20 MG tablet, TAKE 1 Tablet BY MOUTH ONCE DAILY, Disp: 90 tablet, Rfl: 3 .  Calcium Carb-Cholecalciferol (CALCIUM 1000 + D PO), Take 1 tablet by mouth 2 (two) times daily. , Disp: , Rfl:  .  Multiple Vitamin (MULTIVITAMIN) tablet, Take 1 tablet by mouth daily., Disp: , Rfl:  .  albuterol (PROVENTIL HFA;VENTOLIN HFA) 108 (90 Base) MCG/ACT inhaler, Inhale 2 puffs into the lungs every 6 (six) hours as needed for wheezing or shortness of breath. (Patient not taking: Reported on 05/21/2019), Disp: 1 Inhaler, Rfl: 4 .  naproxen sodium (ALEVE) 220 MG tablet, Take 220 mg by mouth., Disp: , Rfl:    Review of Systems  Per HPI unless specifically indicated above      Objective:    There were no vitals taken for this visit.  Wt Readings from Last 3 Encounters:  11/29/18 185 lb (83.9 kg)  11/26/18 184 lb (83.5 kg)  11/26/18 185 lb 8 oz (84.1 kg)    Physical Exam Constitutional:      General: He is not in acute distress.    Appearance: Normal appearance. He is not ill-appearing.  HENT:     Head: Normocephalic and atraumatic.  Pulmonary:     Effort: Pulmonary effort is normal. No respiratory distress.  Neurological:     Mental Status: He is alert and oriented to person, place, and time.  Psychiatric:        Attention and Perception: Attention normal.        Speech: Speech normal.        Behavior: Behavior is cooperative.           Assessment & Plan:    1.  Dyslipidemia  Pt counseled to Get to get labs drawn- he will be called with results He wants to get atorvastatin locally- at [place].  He says he doesn't need medassist.    2.  Cough/copd/tobacco use disorder  Discussed with patient that cough is likely caused by COPD and smoking.  Encouraged patient to use his albuterol all inhaler regularly over the next week or 2 to see if this helps with his cough.  Strongly encouraged patient to stop smoking  3.  Pulmonary nodule  Most recent CAT scan done February 2020 recommended follow-up CAT scan in 12-18 months   Patient has been seeing a pulmonologist but follow-up fell through the cracks it appears due to cleaning issues and wanting to get PFTs.  [place]-he will be gone for 3 or 4 week  Patient is encouraged to wear a mask when in public to reduce risk of COVID-19 transmission.  Patient will follow up in 3 months.  He is to contact office sooner for any problems

## 2019-10-21 ENCOUNTER — Ambulatory Visit (INDEPENDENT_AMBULATORY_CARE_PROVIDER_SITE_OTHER): Payer: Self-pay | Admitting: Physician Assistant

## 2019-10-21 ENCOUNTER — Encounter (INDEPENDENT_AMBULATORY_CARE_PROVIDER_SITE_OTHER): Payer: Self-pay | Admitting: Physician Assistant

## 2019-10-21 ENCOUNTER — Ambulatory Visit (INDEPENDENT_AMBULATORY_CARE_PROVIDER_SITE_OTHER): Payer: Self-pay

## 2019-10-21 VITALS — BP 138/87 | HR 101 | Temp 98.7°F | Resp 22 | Ht 67.0 in | Wt 160.0 lb

## 2019-10-21 DIAGNOSIS — R059 Cough, unspecified: Secondary | ICD-10-CM

## 2019-10-21 DIAGNOSIS — R0602 Shortness of breath: Secondary | ICD-10-CM

## 2019-10-21 DIAGNOSIS — R05 Cough: Secondary | ICD-10-CM

## 2019-10-21 DIAGNOSIS — R5383 Other fatigue: Secondary | ICD-10-CM

## 2019-10-21 DIAGNOSIS — Z20822 Contact with and (suspected) exposure to covid-19: Secondary | ICD-10-CM

## 2019-10-21 LAB — POCT INFLUENZA A/B
POCT Rapid Influenza A AG: NEGATIVE
POCT Rapid Influenza B AG: NEGATIVE

## 2019-10-21 NOTE — Patient Instructions (Signed)
Increase fluids and rest.   Very important to follow up appropriately; will need lab work and CT of chest done.       Weakness with Uncertain Cause  Based on your exam today, the exact cause of your weakness is not clear. But your weakness does not seem to be a sign of a serious illness at this time. Keep an eye on your symptoms and get medical advice as instructed below.   Home care   Rest at home today. Don't over-exert yourself.   Take any medicine as prescribed.   For thenext few days, drink extra fluids (unless your healthcare provider wants you to restrict fluids for other reasons). Don't skip meals.   Unless otherwise directed, continue to take any prescription medicines.   Contact your healthcare provider if you have any questions or concerns.    Follow-up care  Follow up with your healthcare provider, or as advised.  When to seek medical advice  Call your healthcare provider right awayfor any of the following:   Symptoms get worse   Symptoms don't start getting better within 2 days   Fever of 100.4 F (38 C) or higher, or as directed by your healthcare provider  Call 911  Call 911 for any of these:    Chest, arm, neck, jaw, or upper back pain   Trouble breathing   Numbness or weakness of the face, one arm, or one leg   Slurred speech, confusion, ortrouble speaking, walking, or seeing   Blood in vomit or stool (black or red color)   Severe headache   Loss of consciousness  [employer]Well last reviewed this educational content on 04/17/2019   2000-2020 [employer], [place]. [address], [place], [place] [address]. All rights reserved. This information is not intended as a substitute for professional medical care. Always follow your healthcare professional's instructions.

## 2019-10-21 NOTE — Progress Notes (Signed)
EMERGENCY DEPARTMENT HISTORY AND PHYSICAL EXAM    Date Time: @IPTODAYDATE @ 6:33 PM  Patient Name: [name] [name]  Mid level Provider: Carma Leaven, PA-C    History of Presenting Illness:     Chief Complaint: SOB, cough   History obtained from: Patient.  Onset/Duration: 4 wks   Quality:   Severity:moderate  Aggravating Factors: exertion   Alleviating Factors: none   Associated Symptoms: see below   Narrative/Additional Historical Findings:[name] [name] is a 54 y.o. male  Presents to UC with symptoms for the past 4+ weeks: fatigue, chills, sweats, weight loss, cough, shortness of breath, nasal congestion/drainge, generalized weakness. +hoarsness; Pt works 80+ hours a week and has been able to work, but is always exhausted.  Pt states to have "hit a wall" yesterday and is just too exhausted, came for evaluation.  Pt does not feel any of his symptoms have worsened the past few days to weeks, his tolerance has diminished.     No known fever; Denies chest pain, vomiting, abd pain or diarrhea.     Pt travels between here and [place] for work.  He was last seen by his PCP in 08/2019.  Did not follow through to get his labs done as suggested.  In addition, pt is a known smoker, had a pulmonary nodule see on imaging last year - has not had follow up or repeat imaging.     Nursing notes from this date of service were reviewed.    Past Medical History:   History reviewed. No pertinent past medical history.  Immunizations:    Past Surgical History:     Past Surgical History:   Procedure Laterality Date    BACK SURGERY         Family History:   History reviewed. No pertinent family history.    Social History:   @IPSOCHX @    Allergies:     Allergies   Allergen Reactions    Penicillins      As a child       Medications:     Current Outpatient Medications:     naproxen sodium (ANAPROX) 220 MG tablet, Take 220 mg by mouth 2 (two) times daily with meals, Disp: , Rfl:     Review of Systems:   Constitutional: No fever or change in  activity.  Eyes: No eye redness. No eye discharge.  ENT: No ear pain or sore throat  Cardiovascular: No cp or palpitations  Respiratory: +cough +shortness of breath.  GI: No vomiting or diarrhea.  Genitourinary: Normal urination frequency  Musculoskeletal: No extremity pain or decreased use  Skin: no rash or skin lesions.  Neurologic: Normal level of alertness  Psychiatric: nml mentation   All other systems reviewed and are negative  Physical Exam:   @EDTRIAGEVITALS @  Constitutional: Vital signs reviewed. Well hydrated, well perfused, and no increased work of breathing. Appearance: mildly ill appearing with visible fatigue.  Head:  Normocephalic, atraumatic  Eyes: No conjunctival injection. No discharge. EOMI  ENT: erythematous pharynx; no exudate or swelling;  Mucous membranes moist, No oral lesions, TMs wnl  Neck: Normal range of motion. Non-tender.  Respiratory/Chest: crackles noted along right lung base. No respiratory distress.   Cardiovascular: Regular rate and rhythm. No murmur.   Abdomen: Soft and non-tender. No masses or hepatosplenomegaly.  Genitourinary:  UpperExtremity: No edema or cyanosis.  Moving well.  LowerExtremity: No edema or cyanosis.  Moving well.  Neurological: No focal motor deficits by observation. Speech normal. Memory normal.  Skin: Warm and dry.  No rash.  Lymphatic: No cervical lymphadenopathy.  Psychiatric: Normal affect. Normal concentration.    Labs:   @EDLABS @      Rads:     Radiology Results (24 Hour)     ** No results found for the last 24 hours. **          MDM and ED Course   I, Carma Leaven, PA-C, have been the primary provider for [name] [name] during this Emergency Dept visit.  Nursing notes, PMH, SH reviewed.   The attending signature signifies review and agreement of the history, physical examination, evaluation, clinical impression, and plan except as noted.     Oxygen saturation by pulse oximetry is 95%-100%, Normal.  Interventions: None Needed.      DDX  Viral illness,  influenza, covid, chronic bronchitis   Pt with known pulmonary nodule and long standing smoker; lung cancer can not excluded.  Pt made aware of limitations of work up in UC.  He needs a thorough evaluation, including Chest CT, which can not be done here.  All tests can be facilitated out patient; however if any symptoms worsen, pt advised to go straight to ED.     When I was within 6 feet of this patient I donned the following PPE:  Surgical Mask Yes, Gloves Yes, Gown Yes; Goggles No  ; Face Shield Yes, 36M 6000 Respirator Yes; N95 No  .  The patient was wearing a mask during my evaluation Yes.      Xray: atelectasis, scarring, thickening, effusion - pt advised to follow up with PCP for his repeat CT of chest. Covid test pending.   Assessment/Plan:   Results and instructions reviewed at the bedside with patient and family.    Clinical Impression  Final diagnoses:   Cough       Disposition  @EDDISPO @    Prescriptions  New Prescriptions    No medications on file           Signed by: Phillips Grout, PA-C

## 2019-10-24 LAB — CORONAVIRUS 2 (SARS-COV-2) RNA DETECTION: SARS CoV 2 Overall Result: NOT DETECTED

## 2019-11-05 ENCOUNTER — Telehealth: Payer: Self-pay | Admitting: Cardiovascular Disease

## 2019-11-05 NOTE — Telephone Encounter (Signed)

## 2019-11-07 ENCOUNTER — Telehealth: Payer: Self-pay | Admitting: Licensed Clinical Social Worker

## 2019-11-07 ENCOUNTER — Encounter: Payer: Self-pay | Admitting: Cardiovascular Disease

## 2019-11-07 ENCOUNTER — Telehealth (INDEPENDENT_AMBULATORY_CARE_PROVIDER_SITE_OTHER): Payer: Self-pay | Admitting: Cardiovascular Disease

## 2019-11-07 VITALS — Ht 67.0 in | Wt 170.0 lb

## 2019-11-07 DIAGNOSIS — I3139 Other pericardial effusion (noninflammatory): Secondary | ICD-10-CM

## 2019-11-07 DIAGNOSIS — R079 Chest pain, unspecified: Secondary | ICD-10-CM

## 2019-11-07 DIAGNOSIS — I300 Acute nonspecific idiopathic pericarditis: Secondary | ICD-10-CM

## 2019-11-07 DIAGNOSIS — I313 Pericardial effusion (noninflammatory): Secondary | ICD-10-CM

## 2019-11-07 NOTE — Progress Notes (Signed)
Virtual Visit via Telephone Note   This visit type was conducted due to national recommendations for restrictions regarding the COVID-19 Pandemic (e.g. social distancing) in an effort to limit this patient's exposure and mitigate transmission in our community.  Due to his co-morbid illnesses, this patient is at least at moderate risk for complications without adequate follow up.  This format is felt to be most appropriate for this patient at this time.  The patient did not have access to video technology/had technical difficulties with video requiring transitioning to audio format only (telephone).  All issues noted in this document were discussed and addressed.  No physical exam could be performed with this format.  Please refer to the patient's chart for his  consent to telehealth for [facility].   Date:  11/07/2019   ID:  Jacob Reilly, DOB [birth year], MRN [id number]  Patient Location: Home Provider Location: Office  PCP:  [name], PA-C  Cardiologist:  Prentice Docker, MD  Electrophysiologist:  None   Evaluation Performed:  Follow-Up Visit  Chief Complaint:  pericarditis and pericardial effusion  History of Present Illness:    Jacob Reilly is a 54 y.o. male with a history of pericarditis and pericardial effusion.  A limited echocardiogram performed on 02/02/2018 showed resolution of pericardial effusion and normal left ventricular systolic function, LVEF 55 to 51%.  He's had some chest pains in the past few weeks. He had some bronchitis recently. He denies fevers.  Chest pains occur when he's at work around midday. [name] feels like indigestion.  He currently denies any symptoms.   Past Medical History:  Diagnosis Date  . Anxiety   . Asthma    as child-now with allergy season only.  . Depression   . ETOH abuse   . Hyperlipidemia   . Kidney calculi   . Low back pain   . Pneumonia   . Polysubstance abuse (HCC)   . PTSD (post-traumatic stress disorder)     Past Surgical History:  Procedure Laterality Date  . BACK SURGERY    . HYDROCELE EXCISION Left 08/01/2013   Procedure: HYDROCELECTOMY ADULT;  Surgeon: Ky Barban, MD;  Location: [facility];  Service: Urology;  Laterality: Left;  . I & D EXTREMITY  10/24/2012   Procedure: IRRIGATION AND DEBRIDEMENT EXTREMITY;  Surgeon: Tami Ribas, MD;  Location: [facility];  Service: Orthopedics;  Laterality: Right;  . OPEN REDUCTION INTERNAL FIXATION (ORIF) DISTAL RADIAL FRACTURE Right 05/16/2015   Procedure: OPEN REDUCTION INTERNAL FIXATION (ORIF) RIGHT DISTAL RADIUS FRACTURE;  Surgeon: Dairl Ponder, MD;  Location: [facility];  Service: Orthopedics;  Laterality: Right;  . OPEN REDUCTION INTERNAL FIXATION (ORIF) HAND Right thumb  . OPEN REDUCTION INTERNAL FIXATION (ORIF) METACARPAL Left 05/16/2015   Procedure: OPEN REDUCTION INTERNAL FIXATION OF MIDDLE PHALANGEAL FRACTURE, EXTENSOR TENDON REPAIR WITH ULNAR DIGITAL NERVE REPAIR MICROSCOPICALLY;  Surgeon: Dairl Ponder, MD;  Location: [facility];  Service: Orthopedics;  Laterality: Left;  . SHOULDER ARTHROSCOPY WITH ROTATOR CUFF REPAIR AND SUBACROMIAL DECOMPRESSION Left 06/08/2016   Procedure: LEFT SHOULDER ARTHROSCOPY WITH ROTATOR CUFF REPAIR, SUBACROMIAL DECOMPRESSION, DISTAL CLAVICLE EXCISION;  Surgeon: Tarry Kos, MD;  Location: [facility];  Service: Orthopedics;  Laterality: Left;  . THUMB FUSION Right      Current Meds  Medication Sig  . atorvastatin (LIPITOR) 20 MG tablet Take 1 tablet (20 mg total) by mouth daily.  . Calcium Carb-Cholecalciferol (CALCIUM 1000 + D PO) Take 1 tablet by mouth 2 (two) times daily.   Marland Kitchen  Multiple Vitamin (MULTIVITAMIN) tablet Take 1 tablet by mouth daily.  . naproxen sodium (ALEVE) 220 MG tablet Take 220 mg by mouth.     Allergies:   Penicillins and Prilosec [omeprazole]   Social History   Tobacco Use  . Smoking status: Current Every Day Smoker    Packs/day: 1.00    Years: 44.00    Pack years: 44.00     Types: Cigarettes    Start date: 01/29/1973  . Smokeless tobacco: Former Systems developer    Types: Chew  Substance Use Topics  . Alcohol use: Yes    Alcohol/week: 8.0 standard drinks    Types: 8 Cans of beer per week    Comment: hx heavy etoh august 2016  . Drug use: Yes    Types: Marijuana, Cocaine, Other-see comments    Comment: none since august 2016     Family Hx: The patient's family history includes COPD in his mother; Cancer in his father; Diabetes in his mother; Heart disease in his mother; Hypertension in his mother; Kidney disease in his mother; Obesity in his mother.  ROS:   Please see the history of present illness.     All other systems reviewed and are negative.   Prior CV studies:   The following studies were reviewed today:  NA  Labs/Other Tests and Data Reviewed:    EKG:  No ECG reviewed.  Recent Labs: 11/29/2018: Hemoglobin 15.4; Platelets 356 05/20/2019: ALT 34; BUN 22; Creatinine, Ser 0.76; Potassium 4.5; Sodium 138   Recent Lipid Panel Lab Results  Component Value Date/Time   CHOL 114 05/20/2019 10:50 AM   TRIG 29 05/20/2019 10:50 AM   HDL 69 05/20/2019 10:50 AM   CHOLHDL 1.7 05/20/2019 10:50 AM   LDLCALC 39 05/20/2019 10:50 AM    Wt Readings from Last 3 Encounters:  11/07/19 170 lb (77.1 kg)  11/29/18 185 lb (83.9 kg)  11/26/18 184 lb (83.5 kg)     Objective:    Vital Signs:  Ht 5\' 7"  (1.702 m)   Wt 170 lb (77.1 kg)   BMI 26.63 kg/m    VITAL SIGNS:  reviewed  ASSESSMENT & PLAN:    1.  Pericarditis: This has resolved.  No further studies are indicated at this time.  2.  Pericardial effusion: This has resolved.  No further studies are indicated at this time.    COVID-19 Education: The signs and symptoms of COVID-19 were discussed with the patient and how to seek care for testing (follow up with PCP or arrange E-visit).  The importance of social distancing was discussed today.  Time:   Today, I have spent 5 minutes with the patient with  telehealth technology discussing the above problems.     Medication Adjustments/Labs and Tests Ordered: Current medicines are reviewed at length with the patient today.  Concerns regarding medicines are outlined above.   Tests Ordered: No orders of the defined types were placed in this encounter.   Medication Changes: No orders of the defined types were placed in this encounter.   Follow Up:  Virtual Visit  prn  Signed, Kate Sable, MD  11/07/2019 12:18 PM    [place]

## 2019-11-07 NOTE — Telephone Encounter (Signed)
CSW referred to assist patient with obtaining a scale and BP cuff. CSW contacted patient to inform scale and cuff will be delivered to home. Patient grateful for support and assistance. CSW available as needed. Jackie Saralyn Willison, LCSW, CCSW-MCS [phone]  

## 2019-11-07 NOTE — Patient Instructions (Signed)
Medication Instructions:  Your physician recommends that you continue on your current medications as directed. Please refer to the Current Medication list given to you today.  *If you need a refill on your cardiac medications before your next appointment, please call your pharmacy*  Lab Work: NONE If you have labs (blood work) drawn today and your tests are completely normal, you will receive your results only by: Marland Kitchen MyChart Message (if you have MyChart) OR . A paper copy in the mail If you have any lab test that is abnormal or we need to change your treatment, we will call you to review the results.  Testing/Procedures: NONE  Follow-Up: As needed with Dr.Koneswaran     Thank you for choosing [facility] !

## 2019-12-10 ENCOUNTER — Ambulatory Visit: Payer: Worker's Compensation | Admitting: Physician Assistant

## 2021-06-03 ENCOUNTER — Telehealth: Payer: Self-pay

## 2021-06-03 NOTE — Telephone Encounter (Signed)
Attempted call to assist client in setting an appointment to re-establish medical care with [facility]. No answer, left voicemail requesting return call.  Francee Nodal RN [employer]

## 2021-06-03 NOTE — Telephone Encounter (Signed)
Called client to assist with making an appointment with [facility] to re-establish medical care. Appointment scheduled for 06/10/21 at 1PM. Client texted his appointment date and time along with [facility].  Plan: will follow up after his visit 06/10/21 Discussed that [employer] has a food market available to clients once per month during office hours.  Francee Nodal RN [facility]

## 2021-06-10 ENCOUNTER — Ambulatory Visit: Payer: Worker's Compensation | Admitting: Physician Assistant

## 2021-06-10 ENCOUNTER — Other Ambulatory Visit: Payer: Self-pay

## 2021-06-10 ENCOUNTER — Encounter: Payer: Self-pay | Admitting: Physician Assistant

## 2021-06-10 ENCOUNTER — Telehealth: Payer: Self-pay

## 2021-06-10 VITALS — BP 136/92 | HR 86 | Temp 98.0°F | Ht 67.0 in | Wt 166.0 lb

## 2021-06-10 DIAGNOSIS — Z8781 Personal history of (healed) traumatic fracture: Secondary | ICD-10-CM

## 2021-06-10 DIAGNOSIS — F17200 Nicotine dependence, unspecified, uncomplicated: Secondary | ICD-10-CM

## 2021-06-10 DIAGNOSIS — M48061 Spinal stenosis, lumbar region without neurogenic claudication: Secondary | ICD-10-CM

## 2021-06-10 DIAGNOSIS — Z125 Encounter for screening for malignant neoplasm of prostate: Secondary | ICD-10-CM

## 2021-06-10 DIAGNOSIS — G8929 Other chronic pain: Secondary | ICD-10-CM

## 2021-06-10 DIAGNOSIS — Z7689 Persons encountering health services in other specified circumstances: Secondary | ICD-10-CM

## 2021-06-10 DIAGNOSIS — E785 Hyperlipidemia, unspecified: Secondary | ICD-10-CM

## 2021-06-10 DIAGNOSIS — Z1211 Encounter for screening for malignant neoplasm of colon: Secondary | ICD-10-CM

## 2021-06-10 DIAGNOSIS — R911 Solitary pulmonary nodule: Secondary | ICD-10-CM

## 2021-06-10 MED ORDER — DICLOFENAC SODIUM 75 MG PO TBEC: 75.0000 mg | DELAYED_RELEASE_TABLET | Freq: Two times a day (BID) | ORAL | 1 refills | Status: DC | PRN

## 2021-06-10 NOTE — Progress Notes (Signed)
BP (!) 136/92   Pulse 86   Temp 98 F (36.7 C)   Ht 5\' 7"  (1.702 m)   Wt 166 lb (75.3 kg)   SpO2 95%   BMI 26.00 kg/m    Subjective:    Patient ID: , male    DOB: [birth year], 55 y.o.   MRN: [id number]  HPI: Jacob Reilly is a 55 y.o. male presenting on 06/10/2021 for New Patient (Initial Visit)   HPI   Pt had a negative covid 19 screening questionnaire.    Pt is 55yoM who presents to re-establish care.  Jacob Reilly was Last seen here November 2020.    Jacob Reilly is not currently working.  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  Hx- dyslipidemia, cough, tobacco use, pulmonary nodule,   Pt did not go anywehre else for medical care since Jacob Reilly was seen here last.     Pt is not vaccinated for covid  Jacob Reilly is still smoking.  His sob is about the same.  Pt reports chronic back pain.  Jacob Reilly Says Jacob Reilly needs something stronger than aleve.  Jacob Reilly was seen by [facility] in the past.  Jacob Reilly carries the diagnosis of  Spinal stenosis in the lumbar region.   Jacob Reilly has a history of compression fractures sustained during a fall from a cell phone tower.    Jacob Reilly says Jacob Reilly is Still with anxiety- it helped. Anxiety, ptsd etc.  Jacob Reilly also reports being depressed due to the pain.  Jacob Reilly denies SI, HI.   GAD-7 score 8 PHQ-9 score 16     Relevant past medical, surgical, family and social history reviewed and updated as indicated. Interim medical history since our last visit reviewed. Allergies and medications reviewed and updated.    Current Outpatient Medications:    Calcium Carb-Cholecalciferol (CALCIUM 1000 + D PO), Take 1 tablet by mouth 2 (two) times daily. , Disp: , Rfl:    Multiple Vitamin (MULTIVITAMIN) tablet, Take 1 tablet by mouth daily., Disp: , Rfl:    naproxen sodium (ALEVE) 220 MG tablet, Take 220 mg by mouth., Disp: , Rfl:     Review of Systems  Per HPI unless specifically indicated above     Objective:    BP (!) 136/92   Pulse 86    Temp 98 F (36.7 C)   Ht 5\' 7"  (1.702 m)   Wt 166 lb (75.3 kg)   SpO2 95%   BMI 26.00 kg/m   Wt Readings from Last 3 Encounters:  06/10/21 166 lb (75.3 kg)  11/07/19 170 lb (77.1 kg)  11/29/18 185 lb (83.9 kg)    Physical Exam Vitals reviewed.  Constitutional:      General: Jacob Reilly is not in acute distress.    Appearance: Jacob Reilly is well-developed. Jacob Reilly is not ill-appearing.  HENT:     Head: Normocephalic and atraumatic.     Right Ear: Tympanic membrane, ear canal and external ear normal.     Left Ear: Tympanic membrane, ear canal and external ear normal.  Eyes:     Extraocular Movements: Extraocular movements intact.     Conjunctiva/sclera: Conjunctivae normal.     Pupils: Pupils are equal, round, and reactive to light.  Neck:     Thyroid: No thyromegaly.  Cardiovascular:     Rate and Rhythm: Normal rate and regular rhythm.  Pulmonary:     Effort: Pulmonary effort is normal.     Breath sounds: Normal breath sounds. No  wheezing or rales.  Abdominal:     General: Bowel sounds are normal.     Palpations: Abdomen is soft. There is no mass.     Tenderness: There is no abdominal tenderness.  Musculoskeletal:     Cervical back: Neck supple.     Right lower leg: No edema.     Left lower leg: No edema.  Lymphadenopathy:     Cervical: No cervical adenopathy.  Skin:    General: Skin is warm and dry.     Findings: No rash.  Neurological:     Mental Status: Jacob Reilly is alert and oriented to person, place, and time.     Motor: No weakness or tremor.  Psychiatric:        Attention and Perception: Attention normal.        Speech: Speech normal.        Behavior: Behavior normal.     Comments: Engaged.  Pleasant.           Assessment & Plan:   Encounter Diagnoses  Name Primary?   Encounter to establish care Yes   Chronic low back pain with bilateral sciatica, unspecified back pain laterality    History of vertebral compression fracture    Spinal stenosis of lumbar region, unspecified  whether neurogenic claudication present    Hyperlipidemia, unspecified hyperlipidemia type    Tobacco use disorder    Pulmonary nodule    Screening for prostate cancer    Screening for colon cancer        -pt is given application for [employer] fiinancial assistance -will reFer back to [facility]/Drs Nitka and [facility]in where Jacob Reilly has been treated for his back in the past -will update CT chest to re-evaluate his nodule -encouraged smoking cessation -will update fasting Labs -pt is given FIT test for colon cancer screening -discussed with pt that Jacob Reilly Needs MH.  Jacob Reilly doesn't want prescription medication at this time.  Jacob Reilly doesn't want to return to [facility] where Jacob Reilly had bad experience in the past.  Encouraged pt to Discuss other options for MH care with [facility] -educated pt and encouraged him to get Covid vacciantion -rx Diclofenac for back pain -pt to follow up  1 month.  Jacob Reilly is to contact office sooner prn

## 2021-06-10 NOTE — Telephone Encounter (Signed)
Called to follow up with client after his visit with [facility]. He reports things went well, he was prescribed Diclofenac for pain. He is aware to get his fasting labs drawn and that he has a follow up appointment next month.   Discussed with client   Food needs: reminded client that [employer] is available once per month to help with food insecurity. Discussed hours of pantry and some items that are stocked.  Mental Health Services: client has been to [facility] in the past and states he had a bad experience with the psychiatrist and did not like the antidepressants he was on and stopped those "cold [place]"   Discussed [facility] for counseling and also have a psychiatrist on staff, he could be referred there for [facility] services only, discussed co-pay would be based on sliding scale determined by income.  Other options would be [facility] which is research based and is counseling for mild depression, no medications prescribed. Evaluation conducted and determined if he is into the program. [facility] will also in the late fall have BSW intern and MSW intern available , no medications prescribed. Client states he is not interested in medications and is really "not much on talking and sharing my problems with others". He requested time to think about the options. Discussed that [facility] is here to assist him with any resources he may need, to contact us. Client reports understanding and states he will "think about options"  Francee Nodal RN [employer]

## 2021-06-15 ENCOUNTER — Telehealth: Payer: Self-pay | Admitting: Physical Medicine and Rehabilitation

## 2021-06-15 ENCOUNTER — Other Ambulatory Visit: Payer: Self-pay | Admitting: Physician Assistant

## 2021-06-15 DIAGNOSIS — Z1211 Encounter for screening for malignant neoplasm of colon: Secondary | ICD-10-CM

## 2021-06-15 NOTE — Telephone Encounter (Signed)
Appointment cancelled

## 2021-06-15 NOTE — Telephone Encounter (Signed)
Pt called requesting to cancel his appt. He is nor ready for this appt and will call when he has availability to see the dr. No call back needed.

## 2021-06-16 ENCOUNTER — Ambulatory Visit: Payer: Self-pay | Admitting: Physical Medicine and Rehabilitation

## 2021-06-30 ENCOUNTER — Other Ambulatory Visit (HOSPITAL_COMMUNITY)
Admission: RE | Admit: 2021-06-30 | Discharge: 2021-06-30 | Disposition: A | Discharge status: Home / Self Care | Payer: Self-pay | Source: Ambulatory Visit | Attending: Physician Assistant | Admitting: Physician Assistant

## 2021-06-30 DIAGNOSIS — E785 Hyperlipidemia, unspecified: Secondary | ICD-10-CM | POA: Insufficient documentation

## 2021-06-30 DIAGNOSIS — Z125 Encounter for screening for malignant neoplasm of prostate: Secondary | ICD-10-CM | POA: Insufficient documentation

## 2021-06-30 LAB — COMPREHENSIVE METABOLIC PANEL
ALT: 18 U/L (ref 0–44)
AST: 18 U/L (ref 15–41)
Albumin: 4.3 g/dL (ref 3.5–5.0)
Alkaline Phosphatase: 81 U/L (ref 38–126)
Anion gap: 7 (ref 5–15)
BUN: 21 mg/dL — ABNORMAL HIGH (ref 6–20)
CO2: 27 mmol/L (ref 22–32)
Calcium: 9.4 mg/dL (ref 8.9–10.3)
Chloride: 103 mmol/L (ref 98–111)
Creatinine, Ser: 0.72 mg/dL (ref 0.61–1.24)
GFR, Estimated: >60 mL/min (ref >60)
Glucose, Bld: 103 mg/dL — ABNORMAL HIGH (ref 70–99)
Potassium: 4.8 mmol/L (ref 3.5–5.1)
Sodium: 137 mmol/L (ref 135–145)
Total Bilirubin: 0.7 mg/dL (ref 0.3–1.2)
Total Protein: 7.3 g/dL (ref 6.5–8.1)

## 2021-06-30 LAB — LIPID PANEL
Cholesterol: 159 mg/dL (ref 0–200)
HDL: 57 mg/dL (ref >40)
LDL Cholesterol: 90 mg/dL (ref 0–99)
Total CHOL/HDL Ratio: 2.8 RATIO
Triglycerides: 60 mg/dL (ref <150)
VLDL: 12 mg/dL (ref 0–40)

## 2021-06-30 LAB — IFOBT (OCCULT BLOOD): IFOBT: NEGATIVE

## 2021-06-30 LAB — PSA: Prostatic Specific Antigen: 1.84 ng/mL (ref 0.00–4.00)

## 2021-07-02 ENCOUNTER — Ambulatory Visit (HOSPITAL_COMMUNITY)
Admission: RE | Admit: 2021-07-02 | Discharge: 2021-07-02 | Disposition: A | Discharge status: Home / Self Care | Payer: Self-pay | Source: Ambulatory Visit | Attending: Physician Assistant | Admitting: Physician Assistant

## 2021-07-02 ENCOUNTER — Other Ambulatory Visit: Payer: Self-pay

## 2021-07-02 DIAGNOSIS — R911 Solitary pulmonary nodule: Secondary | ICD-10-CM | POA: Insufficient documentation

## 2021-07-02 DIAGNOSIS — F17200 Nicotine dependence, unspecified, uncomplicated: Secondary | ICD-10-CM | POA: Insufficient documentation

## 2021-07-13 ENCOUNTER — Encounter: Payer: Self-pay | Admitting: Physician Assistant

## 2021-07-13 ENCOUNTER — Other Ambulatory Visit: Payer: Self-pay

## 2021-07-13 ENCOUNTER — Ambulatory Visit: Payer: Worker's Compensation | Admitting: Physician Assistant

## 2021-07-13 VITALS — BP 120/79 | HR 74 | Temp 97.8°F | Wt 172.5 lb

## 2021-07-13 DIAGNOSIS — F17200 Nicotine dependence, unspecified, uncomplicated: Secondary | ICD-10-CM

## 2021-07-13 DIAGNOSIS — G8929 Other chronic pain: Secondary | ICD-10-CM

## 2021-07-13 DIAGNOSIS — I2510 Atherosclerotic heart disease of native coronary artery without angina pectoris: Secondary | ICD-10-CM

## 2021-07-13 MED ORDER — ATORVASTATIN CALCIUM 20 MG PO TABS: 20.0000 mg | ORAL_TABLET | Freq: Every day | ORAL | 1 refills | Status: DC

## 2021-07-13 NOTE — Progress Notes (Signed)
BP 120/79   Pulse 74   Temp 97.8 F (36.6 C)   Wt 172 lb 8 oz (78.2 kg)   SpO2 96%   BMI 27.02 kg/m    Subjective:    Patient ID: Jacob Reilly, male    DOB: 06/11/1966, 55 y.o.   MRN: [id number]  HPI: Jacob Reilly is a 55 y.o. male presenting on 07/13/2021 for Follow-up   HPI   Pt had a negative covid 19 screening questionnaire.   Pt is in today to follow up new pt appointment last month.   He says he had surgery left pinky finger in 2015 and he has a suture sticking out.  He says it happened to him in the past as well.     Hx pericarditis- CAD on CT Pt says he Sometimes has CP when he bends over.  No CP when he exerts himself.      Relevant past medical, surgical, family and social history reviewed and updated as indicated. Interim medical history since our last visit reviewed. Allergies and medications reviewed and updated.   Current Outpatient Medications:    Calcium Carb-Cholecalciferol (CALCIUM 1000 + D PO), Take 1 tablet by mouth 2 (two) times daily. , Disp: , Rfl:    Multiple Vitamin (MULTIVITAMIN) tablet, Take 1 tablet by mouth daily., Disp: , Rfl:    diclofenac (VOLTAREN) 75 MG EC tablet, Take 1 tablet (75 mg total) by mouth 2 (two) times daily as needed. (Patient not taking: Reported on 07/13/2021), Disp: 60 tablet, Rfl: 1   Review of Systems  Per HPI unless specifically indicated above     Objective:    BP 120/79   Pulse 74   Temp 97.8 F (36.6 C)   Wt 172 lb 8 oz (78.2 kg)   SpO2 96%   BMI 27.02 kg/m   Wt Readings from Last 3 Encounters:  07/13/21 172 lb 8 oz (78.2 kg)  06/10/21 166 lb (75.3 kg)  11/07/19 170 lb (77.1 kg)    Physical Exam Vitals reviewed.  Constitutional:      General: He is not in acute distress.    Appearance: He is well-developed. He is not ill-appearing.  HENT:     Head: Normocephalic and atraumatic.  Cardiovascular:     Rate and Rhythm: Normal rate and regular rhythm.  Pulmonary:     Effort: Pulmonary effort  is normal.     Breath sounds: Normal breath sounds. No wheezing.  Abdominal:     General: Bowel sounds are normal.     Palpations: Abdomen is soft.     Tenderness: There is no abdominal tenderness.  Musculoskeletal:     Cervical back: Neck supple.     Right lower leg: No edema.     Left lower leg: No edema.     Comments: Left 5th finger with deformity due to previous injury and surgery.  Suture is protruding.  No purulence or swelling at the site.    Lymphadenopathy:     Cervical: No cervical adenopathy.  Skin:    General: Skin is warm and dry.  Neurological:     Mental Status: He is alert and oriented to person, place, and time.  Psychiatric:        Behavior: Behavior normal.    Results for orders placed or performed during the hospital encounter of 06/30/21  PSA  Result Value Ref Range   Prostatic Specific Antigen 1.84 0.00 - 4.00 ng/mL  Comprehensive metabolic panel  Result Value Ref Range  Sodium 137 135 - 145 mmol/L   Potassium 4.8 3.5 - 5.1 mmol/L   Chloride 103 98 - 111 mmol/L   CO2 27 22 - 32 mmol/L   Glucose, Bld 103 (H) 70 - 99 mg/dL   BUN 21 (H) 6 - 20 mg/dL   Creatinine, Ser 7.51 0.61 - 1.24 mg/dL   Calcium 9.4 8.9 - 02.5 mg/dL   Total Protein 7.3 6.5 - 8.1 g/dL   Albumin 4.3 3.5 - 5.0 g/dL   AST 18 15 - 41 U/L   ALT 18 0 - 44 U/L   Alkaline Phosphatase 81 38 - 126 U/L   Total Bilirubin 0.7 0.3 - 1.2 mg/dL   GFR, Estimated >85 >27 mL/min   Anion gap 7 5 - 15  Lipid panel  Result Value Ref Range   Cholesterol 159 0 - 200 mg/dL   Triglycerides 60 <782 mg/dL   HDL 57 >42 mg/dL   Total CHOL/HDL Ratio 2.8 RATIO   VLDL 12 0 - 40 mg/dL   LDL Cholesterol 90 0 - 99 mg/dL      Assessment & Plan:    Encounter Diagnoses  Name Primary?   Coronary artery disease involving native coronary artery of native heart without angina pectoris Yes   Tobacco use disorder    Chronic low back pain with bilateral sciatica, unspecified back pain laterality       -Reviewed labs and CT -pulmonary nodule felt to be benign process -Due to CAD on CT, rx statin-  atorvastatin to [facility] -Recommended smoking cessation -Pt didn't go to orthopedic appointment for his back issues due to 1-transportation issues and 2- hasn't been approved yet for cafa-   -Pt to contact [facility] to get cafa submitted -Pt scheduled fot flu and covid -Discussed echo to re-evaluate heart in light of history pericarditis and recent pain with bending over.  Pt wants to hold off on that for now -suture pulled but will not come out so it was snipped off at the skin level.  Band-aid applied.  Pt to contact office if it creates further issue -F/u 3 months.  He is to contact office sooner prn

## 2021-08-24 ENCOUNTER — Telehealth: Payer: Self-pay | Admitting: Licensed Clinical Social Worker

## 2021-08-24 NOTE — Telephone Encounter (Signed)
[facility] attempted to reach patient regarding counseling services, call to number listed would not go through.

## 2021-10-13 ENCOUNTER — Other Ambulatory Visit: Payer: Self-pay | Admitting: Physician Assistant

## 2021-10-13 DIAGNOSIS — E785 Hyperlipidemia, unspecified: Secondary | ICD-10-CM

## 2021-10-13 DIAGNOSIS — I2510 Atherosclerotic heart disease of native coronary artery without angina pectoris: Secondary | ICD-10-CM

## 2021-10-26 ENCOUNTER — Ambulatory Visit: Payer: Worker's Compensation | Admitting: Physician Assistant

## 2022-04-25 NOTE — Congregational Nurse Program (Signed)
Completion of documentation for submittal for [facility] Financial Assistance Dept   Plan -Information/documents were reviewed completion and forwarded  Fay Records of [facility] to submit to [facility] Dept

## 2022-05-11 ENCOUNTER — Other Ambulatory Visit (HOSPITAL_COMMUNITY)
Admission: RE | Admit: 2022-05-11 | Discharge: 2022-05-11 | Disposition: A | Discharge status: Home / Self Care | Payer: Self-pay | Source: Ambulatory Visit | Attending: Physician Assistant | Admitting: Physician Assistant

## 2022-05-11 ENCOUNTER — Ambulatory Visit: Payer: Worker's Compensation | Admitting: Physician Assistant

## 2022-05-11 ENCOUNTER — Encounter: Payer: Self-pay | Admitting: Physician Assistant

## 2022-05-11 VITALS — BP 118/78 | HR 88 | Temp 96.1°F

## 2022-05-11 DIAGNOSIS — T148XXA Other injury of unspecified body region, initial encounter: Secondary | ICD-10-CM | POA: Insufficient documentation

## 2022-05-11 DIAGNOSIS — R5383 Other fatigue: Secondary | ICD-10-CM | POA: Insufficient documentation

## 2022-05-11 DIAGNOSIS — F17200 Nicotine dependence, unspecified, uncomplicated: Secondary | ICD-10-CM

## 2022-05-11 DIAGNOSIS — E785 Hyperlipidemia, unspecified: Secondary | ICD-10-CM | POA: Insufficient documentation

## 2022-05-11 DIAGNOSIS — I2510 Atherosclerotic heart disease of native coronary artery without angina pectoris: Secondary | ICD-10-CM

## 2022-05-11 DIAGNOSIS — Z8679 Personal history of other diseases of the circulatory system: Secondary | ICD-10-CM

## 2022-05-11 DIAGNOSIS — W57XXXA Bitten or stung by nonvenomous insect and other nonvenomous arthropods, initial encounter: Secondary | ICD-10-CM | POA: Insufficient documentation

## 2022-05-11 DIAGNOSIS — I3139 Other pericardial effusion (noninflammatory): Secondary | ICD-10-CM

## 2022-05-11 DIAGNOSIS — Y939 Activity, unspecified: Secondary | ICD-10-CM | POA: Insufficient documentation

## 2022-05-11 DIAGNOSIS — R519 Headache, unspecified: Secondary | ICD-10-CM

## 2022-05-11 DIAGNOSIS — R062 Wheezing: Secondary | ICD-10-CM

## 2022-05-11 LAB — COMPREHENSIVE METABOLIC PANEL
ALT: 17 U/L (ref 0–44)
AST: 23 U/L (ref 15–41)
Albumin: 4.4 g/dL (ref 3.5–5.0)
Alkaline Phosphatase: 92 U/L (ref 38–126)
Anion gap: 8 (ref 5–15)
BUN: 7 mg/dL (ref 6–20)
CO2: 26 mmol/L (ref 22–32)
Calcium: 9.4 mg/dL (ref 8.9–10.3)
Chloride: 104 mmol/L (ref 98–111)
Creatinine, Ser: 0.88 mg/dL (ref 0.61–1.24)
GFR, Estimated: >60 mL/min (ref >60)
Glucose, Bld: 111 mg/dL — ABNORMAL HIGH (ref 70–99)
Potassium: 4.2 mmol/L (ref 3.5–5.1)
Sodium: 138 mmol/L (ref 135–145)
Total Bilirubin: 0.9 mg/dL (ref 0.3–1.2)
Total Protein: 7.4 g/dL (ref 6.5–8.1)

## 2022-05-11 LAB — LIPID PANEL
Cholesterol: 155 mg/dL (ref 0–200)
HDL: 56 mg/dL (ref >40)
LDL Cholesterol: 76 mg/dL (ref 0–99)
Total CHOL/HDL Ratio: 2.8 RATIO
Triglycerides: 113 mg/dL (ref <150)
VLDL: 23 mg/dL (ref 0–40)

## 2022-05-11 LAB — TSH: TSH: 0.917 u[IU]/mL (ref 0.350–4.500)

## 2022-05-11 MED ORDER — ALBUTEROL SULFATE HFA 108 (90 BASE) MCG/ACT IN AERS: 2.0000 | INHALATION_SPRAY | Freq: Four times a day (QID) | RESPIRATORY_TRACT | 0 refills | Status: DC | PRN

## 2022-05-11 MED ORDER — ATORVASTATIN CALCIUM 20 MG PO TABS: 20.0000 mg | ORAL_TABLET | Freq: Every day | ORAL | 0 refills | Status: DC

## 2022-05-11 NOTE — Progress Notes (Signed)
BP 118/78   Pulse 88   Temp (!) 96.1 F (35.6 C)   SpO2 98%    Subjective:    Patient ID: Jacob Reilly, male    DOB: 12/21/1965, 56 y.o.   MRN: [id number]  HPI: Jacob Reilly is a 56 y.o. male presenting on 05/11/2022 for No chief complaint on file.   HPI   Pt is 56yoM who returns to office for appointment.  He has Not been here since 07/13/21.  He says he is Feeling fatigued lately.   He is working doing Systems developer.     He reports Some wheezing and coughing fits.   He says this has been Worse past couple of months.    He did not get covid vaccination  His Only CP is when bends over- which he had in September or when he lays on his left.   Echo was recommended at the time but pt preferred to wait.  He submittted cafa recently but he has no bills so can't get approved.  He reports also some HA,  weakness fatigues diarrhea depression.  He is concerned about tick born illness because he is outside a lot and frequently pulls off ticks.  He says his depression is more feeling down; he does not find himself crying and denies SI, HI.    Pt had CT chest 07/04/2021 that showed stable pulmonary nodules with no follow up needed.    Relevant past medical, surgical, family and social history reviewed and updated as indicated. Interim medical history since our last visit reviewed. Allergies and medications reviewed and updated.   CURRENT MEDS: None   Review of Systems  Per HPI unless specifically indicated above     Objective:    BP 118/78   Pulse 88   Temp (!) 96.1 F (35.6 C)   SpO2 98%   Wt Readings from Last 3 Encounters:  07/13/21 172 lb 8 oz (78.2 kg)  06/10/21 166 lb (75.3 kg)  11/07/19 170 lb (77.1 kg)    Physical Exam Vitals reviewed.  Constitutional:      General: He is not in acute distress.    Appearance: He is well-developed. He is not ill-appearing.  HENT:     Head: Normocephalic and atraumatic.  Cardiovascular:     Rate and Rhythm: Normal  rate and regular rhythm.  Pulmonary:     Effort: Pulmonary effort is normal.     Breath sounds: Normal breath sounds. No wheezing.  Abdominal:     General: Bowel sounds are normal.     Palpations: Abdomen is soft.     Tenderness: There is no abdominal tenderness.  Musculoskeletal:     Cervical back: Neck supple.     Right lower leg: No edema.     Left lower leg: No edema.  Lymphadenopathy:     Cervical: No cervical adenopathy.  Skin:    General: Skin is warm and dry.  Neurological:     Mental Status: He is alert and oriented to person, place, and time.  Psychiatric:        Behavior: Behavior normal.           Assessment & Plan:    Encounter Diagnoses  Name Primary?   Coronary artery disease involving native coronary artery of native heart without angina pectoris Yes   Hyperlipidemia, unspecified hyperlipidemia type    Other fatigue    Tick bite, unspecified site, initial encounter    Nonintractable headache, unspecified chronicity pattern, unspecified headache type  Wheezing    Tobacco use disorder    History of pericarditis    Pericardial effusion      -will Update labs- fasting -will schedule Echo -will have pt start using albuterol inhaler.  He is encouraged to stop smoking -will restart atorvastatin -pt to follow up 1 month.  He is to contact office sooner prn

## 2022-05-12 ENCOUNTER — Telehealth: Payer: Self-pay | Admitting: Physician Assistant

## 2022-05-12 LAB — LYME DISEASE SEROLOGY W/REFLEX: Lyme Total Antibody EIA: NEGATIVE

## 2022-05-12 LAB — ROCKY MTN SPOTTED FVR ABS PNL(IGG+IGM)
RMSF IgG: NEGATIVE
RMSF IgM: 1.63 index — ABNORMAL HIGH (ref 0.00–0.89)

## 2022-05-12 NOTE — Telephone Encounter (Signed)
Pt was called and notified of echo appt Tuesday 05/24/2022 at 9:30am.

## 2022-05-17 ENCOUNTER — Other Ambulatory Visit: Payer: Self-pay | Admitting: Physician Assistant

## 2022-05-17 MED ORDER — DOXYCYCLINE HYCLATE 100 MG PO TABS: 100.0000 mg | ORAL_TABLET | Freq: Two times a day (BID) | ORAL | 0 refills | Status: AC

## 2022-05-24 ENCOUNTER — Ambulatory Visit (HOSPITAL_COMMUNITY)
Admission: RE | Admit: 2022-05-24 | Discharge: 2022-05-24 | Disposition: A | Discharge status: Home / Self Care | Payer: Self-pay | Source: Ambulatory Visit | Attending: Physician Assistant | Admitting: Physician Assistant

## 2022-05-24 DIAGNOSIS — R5383 Other fatigue: Secondary | ICD-10-CM | POA: Insufficient documentation

## 2022-05-24 DIAGNOSIS — I3139 Other pericardial effusion (noninflammatory): Secondary | ICD-10-CM | POA: Insufficient documentation

## 2022-05-24 DIAGNOSIS — R062 Wheezing: Secondary | ICD-10-CM | POA: Insufficient documentation

## 2022-05-24 DIAGNOSIS — Z8679 Personal history of other diseases of the circulatory system: Secondary | ICD-10-CM | POA: Insufficient documentation

## 2022-05-24 DIAGNOSIS — I2510 Atherosclerotic heart disease of native coronary artery without angina pectoris: Secondary | ICD-10-CM | POA: Insufficient documentation

## 2022-05-24 LAB — ECHOCARDIOGRAM COMPLETE
Area-P 1/2: 2.13 cm2
S' Lateral: 3.4 cm

## 2022-05-24 NOTE — Progress Notes (Signed)
*  PRELIMINARY RESULTS* Echocardiogram 2D Echocardiogram has been performed.  Jacob Reilly 05/24/2022, 10:12 AM

## 2022-06-15 ENCOUNTER — Ambulatory Visit: Payer: Worker's Compensation | Admitting: Physician Assistant

## 2022-06-15 ENCOUNTER — Encounter: Payer: Self-pay | Admitting: Physician Assistant

## 2022-06-15 VITALS — BP 118/82 | HR 80 | Temp 97.8°F | Wt 173.0 lb

## 2022-06-15 DIAGNOSIS — E785 Hyperlipidemia, unspecified: Secondary | ICD-10-CM

## 2022-06-15 DIAGNOSIS — Z1211 Encounter for screening for malignant neoplasm of colon: Secondary | ICD-10-CM

## 2022-06-15 DIAGNOSIS — F17200 Nicotine dependence, unspecified, uncomplicated: Secondary | ICD-10-CM

## 2022-06-15 NOTE — Progress Notes (Signed)
BP 118/82   Pulse 80   Temp 97.8 F (36.6 C)   Wt 173 lb (78.5 kg)   SpO2 96%   BMI 27.10 kg/m    Subjective:    Patient ID: Jacob Reilly, male    DOB: [birth year], 56 y.o.   MRN: [id number]  HPI: Jacob Reilly is a 56 y.o. male presenting on 06/15/2022 for Follow-up   HPI   Pt is in today for follow-up  He finished his antibiotics  for RMSF  He is feeeling improved.   He says he is not back to 100%.    Relevant past medical, surgical, family and social history reviewed and updated as indicated. Interim medical history since our last visit reviewed. Allergies and medications reviewed and updated.   Current Outpatient Medications:    albuterol (VENTOLIN HFA) 108 (90 Base) MCG/ACT inhaler, Inhale 2 puffs into the lungs every 6 (six) hours as needed for wheezing or shortness of breath., Disp: 3 each, Rfl: 0   atorvastatin (LIPITOR) 20 MG tablet, Take 1 tablet (20 mg total) by mouth daily., Disp: 90 tablet, Rfl: 0   Calcium Carb-Cholecalciferol (CALCIUM 1000 + D PO), Take 1 tablet by mouth 2 (two) times daily., Disp: , Rfl:    diclofenac (VOLTAREN) 75 MG EC tablet, Take 1 tablet (75 mg total) by mouth 2 (two) times daily as needed., Disp: 60 tablet, Rfl: 1   Multiple Vitamin (MULTIVITAMIN) tablet, Take 1 tablet by mouth daily., Disp: , Rfl:     Review of Systems  Per HPI unless specifically indicated above     Objective:    BP 118/82   Pulse 80   Temp 97.8 F (36.6 C)   Wt 173 lb (78.5 kg)   SpO2 96%   BMI 27.10 kg/m   Wt Readings from Last 3 Encounters:  06/15/22 173 lb (78.5 kg)  07/13/21 172 lb 8 oz (78.2 kg)  06/10/21 166 lb (75.3 kg)    Physical Exam Vitals reviewed.  Constitutional:      General: He is not in acute distress.    Appearance: He is well-developed. He is not ill-appearing.  HENT:     Head: Normocephalic and atraumatic.  Cardiovascular:     Rate and Rhythm: Normal rate and regular rhythm.  Pulmonary:     Effort: Pulmonary effort is  normal.     Breath sounds: Normal breath sounds. No wheezing.  Abdominal:     General: Bowel sounds are normal.     Palpations: Abdomen is soft.     Tenderness: There is no abdominal tenderness.  Musculoskeletal:     Cervical back: Neck supple.     Right lower leg: No edema.     Left lower leg: No edema.  Lymphadenopathy:     Cervical: No cervical adenopathy.  Skin:    General: Skin is warm and dry.  Neurological:     Mental Status: He is alert and oriented to person, place, and time.  Psychiatric:        Behavior: Behavior normal.            Assessment & Plan:   Encounter Diagnoses  Name Primary?   Hyperlipidemia, unspecified hyperlipidemia type Yes   Tobacco use disorder    Screening for colon cancer      -Reviewed echo and labs with pt -will Check on cafa and notify pt -pt is given FIT test for colon cancer screening -pt to F/u 3 months.  He is to contact office sooner  prn

## 2022-08-29 ENCOUNTER — Other Ambulatory Visit: Payer: Self-pay | Admitting: Physician Assistant

## 2022-09-06 ENCOUNTER — Other Ambulatory Visit: Payer: Self-pay | Admitting: Physician Assistant

## 2022-09-06 DIAGNOSIS — Z125 Encounter for screening for malignant neoplasm of prostate: Secondary | ICD-10-CM

## 2022-09-06 DIAGNOSIS — E785 Hyperlipidemia, unspecified: Secondary | ICD-10-CM

## 2022-09-06 DIAGNOSIS — I2510 Atherosclerotic heart disease of native coronary artery without angina pectoris: Secondary | ICD-10-CM

## 2022-09-19 ENCOUNTER — Other Ambulatory Visit (HOSPITAL_COMMUNITY)
Admission: RE | Admit: 2022-09-19 | Discharge: 2022-09-19 | Disposition: A | Discharge status: Home / Self Care | Payer: Self-pay | Source: Ambulatory Visit | Attending: Physician Assistant | Admitting: Physician Assistant

## 2022-09-19 DIAGNOSIS — Z125 Encounter for screening for malignant neoplasm of prostate: Secondary | ICD-10-CM | POA: Insufficient documentation

## 2022-09-19 DIAGNOSIS — E785 Hyperlipidemia, unspecified: Secondary | ICD-10-CM | POA: Insufficient documentation

## 2022-09-19 DIAGNOSIS — I2510 Atherosclerotic heart disease of native coronary artery without angina pectoris: Secondary | ICD-10-CM | POA: Insufficient documentation

## 2022-09-19 LAB — LIPID PANEL
Cholesterol: 141 mg/dL (ref 0–200)
HDL: 58 mg/dL (ref >40)
LDL Cholesterol: 72 mg/dL (ref 0–99)
Total CHOL/HDL Ratio: 2.4 RATIO
Triglycerides: 55 mg/dL (ref <150)
VLDL: 11 mg/dL (ref 0–40)

## 2022-09-19 LAB — COMPREHENSIVE METABOLIC PANEL
ALT: 17 U/L (ref 0–44)
AST: 18 U/L (ref 15–41)
Albumin: 4.1 g/dL (ref 3.5–5.0)
Alkaline Phosphatase: 87 U/L (ref 38–126)
Anion gap: 9 (ref 5–15)
BUN: 11 mg/dL (ref 6–20)
CO2: 25 mmol/L (ref 22–32)
Calcium: 9 mg/dL (ref 8.9–10.3)
Chloride: 103 mmol/L (ref 98–111)
Creatinine, Ser: 0.7 mg/dL (ref 0.61–1.24)
GFR, Estimated: >60 mL/min (ref >60)
Glucose, Bld: 82 mg/dL (ref 70–99)
Potassium: 4 mmol/L (ref 3.5–5.1)
Sodium: 137 mmol/L (ref 135–145)
Total Bilirubin: 0.4 mg/dL (ref 0.3–1.2)
Total Protein: 7.4 g/dL (ref 6.5–8.1)

## 2022-09-19 LAB — PSA: Prostatic Specific Antigen: 2.81 ng/mL (ref 0.00–4.00)

## 2022-09-20 ENCOUNTER — Ambulatory Visit (HOSPITAL_COMMUNITY)
Admission: RE | Admit: 2022-09-20 | Discharge: 2022-09-20 | Disposition: A | Discharge status: Home / Self Care | Payer: Self-pay | Source: Ambulatory Visit | Attending: Physician Assistant | Admitting: Physician Assistant

## 2022-09-20 ENCOUNTER — Encounter: Payer: Self-pay | Admitting: Physician Assistant

## 2022-09-20 ENCOUNTER — Ambulatory Visit: Payer: Worker's Compensation | Admitting: Physician Assistant

## 2022-09-20 DIAGNOSIS — E785 Hyperlipidemia, unspecified: Secondary | ICD-10-CM

## 2022-09-20 DIAGNOSIS — R059 Cough, unspecified: Secondary | ICD-10-CM | POA: Insufficient documentation

## 2022-09-20 DIAGNOSIS — J069 Acute upper respiratory infection, unspecified: Secondary | ICD-10-CM

## 2022-09-20 DIAGNOSIS — F17200 Nicotine dependence, unspecified, uncomplicated: Secondary | ICD-10-CM

## 2022-09-20 DIAGNOSIS — R69 Illness, unspecified: Secondary | ICD-10-CM

## 2022-09-20 DIAGNOSIS — I2510 Atherosclerotic heart disease of native coronary artery without angina pectoris: Secondary | ICD-10-CM

## 2022-09-20 NOTE — Progress Notes (Signed)
There were no vitals taken for this visit.   Subjective:    Patient ID: Jacob Reilly, male    DOB: 1966/04/04, 56 y.o.   MRN: [id number]  HPI: Jacob Reilly is a 56 y.o. male presenting on 09/20/2022 for No chief complaint on file.   HPI   This is a telemedicine appointment through [employer].  I connected with  Jacob Reilly on 09/20/22 by a video enabled telemedicine application and verified that I am speaking with the correct person using two identifiers.   I discussed the limitations of evaluation and management by telemedicine. The patient expressed understanding and agreed to proceed.  Pt is at home.  Provider is at office.    He has appt Friday with [facility] to update enrollment  He has been having coughing fits and "sick as a dog".  He feels really bad.  He has night sweats but not as bad now.  He feels like a pile of dog dookie that has been run over by a speeding truck  He is still smoking 1 ppd  He hasn't checked his temperature but has subjectve fever.    He has been feeling bad for a month  His girlfriend was dx with CAP.   He feels sob He just finished covid test that was negative  He has some wheezng and tickle in his throat that just won't go away.     Relevant past medical, surgical, family and social history reviewed and updated as indicated. Interim medical history since our last visit reviewed. Allergies and medications reviewed and updated.   Current Outpatient Medications:    albuterol (VENTOLIN HFA) 108 (90 Base) MCG/ACT inhaler, INHALE 2 PUFFS BY MOUTH EVERY 6 HOURS AS NEEDED FOR COUGHING, WHEEZING, OR SHORTNESS OF BREATH, Disp: 20.1 g, Rfl: 0   atorvastatin (LIPITOR) 20 MG tablet, TAKE 1 Tablet BY MOUTH ONCE EVERY DAY, Disp: 90 tablet, Rfl: 0   Calcium Carb-Cholecalciferol (CALCIUM 1000 + D PO), Take 1 tablet by mouth 2 (two) times daily., Disp: , Rfl:    Multiple Vitamin (MULTIVITAMIN) tablet, Take 1 tablet by mouth daily., Disp: , Rfl:     diclofenac (VOLTAREN) 75 MG EC tablet, Take 1 tablet (75 mg total) by mouth 2 (two) times daily as needed. (Patient not taking: Reported on 09/20/2022), Disp: 60 tablet, Rfl: 1    Review of Systems  Per HPI unless specifically indicated above     Objective:    There were no vitals taken for this visit.  Wt Readings from Last 3 Encounters:  06/15/22 173 lb (78.5 kg)  07/13/21 172 lb 8 oz (78.2 kg)  06/10/21 166 lb (75.3 kg)    Physical Exam Constitutional:      General: He is not in acute distress.    Appearance: He is not toxic-appearing.  HENT:     Head: Normocephalic and atraumatic.  Pulmonary:     Effort: Pulmonary effort is normal. No respiratory distress.     Comments: Pt is talking in complete sentences without dyspnea Neurological:     Mental Status: He is alert and oriented to person, place, and time.  Psychiatric:        Attention and Perception: Attention normal.        Speech: Speech normal.        Behavior: Behavior normal.     Results for orders placed or performed during the hospital encounter of 09/19/22  Comprehensive metabolic panel  Result Value Ref Range   Sodium 137  135 - 145 mmol/L   Potassium 4.0 3.5 - 5.1 mmol/L   Chloride 103 98 - 111 mmol/L   CO2 25 22 - 32 mmol/L   Glucose, Bld 82 70 - 99 mg/dL   BUN 11 6 - 20 mg/dL   Creatinine, Ser 1.61 0.61 - 1.24 mg/dL   Calcium 9.0 8.9 - 09.6 mg/dL   Total Protein 7.4 6.5 - 8.1 g/dL   Albumin 4.1 3.5 - 5.0 g/dL   AST 18 15 - 41 U/L   ALT 17 0 - 44 U/L   Alkaline Phosphatase 87 38 - 126 U/L   Total Bilirubin 0.4 0.3 - 1.2 mg/dL   GFR, Estimated >04 >54 mL/min   Anion gap 9 5 - 15  Lipid panel  Result Value Ref Range   Cholesterol 141 0 - 200 mg/dL   Triglycerides 55 <098 mg/dL   HDL 58 >11 mg/dL   Total CHOL/HDL Ratio 2.4 RATIO   VLDL 11 0 - 40 mg/dL   LDL Cholesterol 72 0 - 99 mg/dL  PSA  Result Value Ref Range   Prostatic Specific Antigen 2.81 0.00 - 4.00 ng/mL      Assessment & Plan:    Encounter Diagnoses  Name Primary?   Hyperlipidemia, unspecified hyperlipidemia type Yes   Tobacco use disorder    Cough, unspecified type    Sick    Upper respiratory tract infection, unspecified type    Coronary artery disease involving native coronary artery of native heart without angina pectoris      -reviewed labs with pt -pt to continue atorvastatin and lowfat diet -pt to get CXR.  Pt will be called with results -pt encouraged to use albuterol MDI 2-3 times daily until URI resolves -pt encouraged to avoid or decrease smoking -pt to follow up in office in 3 months for dyslipidemia.  He is to contact office sooner prn

## 2022-09-21 ENCOUNTER — Other Ambulatory Visit: Payer: Self-pay | Admitting: Physician Assistant

## 2022-09-21 MED ORDER — AZITHROMYCIN 250 MG PO TABS: ORAL_TABLET | ORAL | 0 refills | Status: AC

## 2022-10-13 ENCOUNTER — Ambulatory Visit: Payer: Worker's Compensation | Admitting: Physician Assistant

## 2022-12-06 ENCOUNTER — Other Ambulatory Visit: Payer: Self-pay | Admitting: Physician Assistant

## 2022-12-06 DIAGNOSIS — E785 Hyperlipidemia, unspecified: Secondary | ICD-10-CM

## 2022-12-08 ENCOUNTER — Other Ambulatory Visit: Payer: Self-pay | Admitting: Physician Assistant

## 2022-12-15 ENCOUNTER — Other Ambulatory Visit (HOSPITAL_COMMUNITY)
Admission: RE | Admit: 2022-12-15 | Discharge: 2022-12-15 | Disposition: A | Discharge status: Home / Self Care | Payer: Self-pay | Source: Ambulatory Visit | Attending: Physician Assistant | Admitting: Physician Assistant

## 2022-12-15 DIAGNOSIS — E785 Hyperlipidemia, unspecified: Secondary | ICD-10-CM | POA: Insufficient documentation

## 2022-12-15 LAB — LIPID PANEL
Cholesterol: 160 mg/dL (ref 0–200)
HDL: 44 mg/dL (ref >40)
LDL Cholesterol: 92 mg/dL (ref 0–99)
Total CHOL/HDL Ratio: 3.6 RATIO
Triglycerides: 120 mg/dL (ref <150)
VLDL: 24 mg/dL (ref 0–40)

## 2022-12-15 LAB — HEPATIC FUNCTION PANEL
ALT: 36 U/L (ref 0–44)
AST: 23 U/L (ref 15–41)
Albumin: 4.4 g/dL (ref 3.5–5.0)
Alkaline Phosphatase: 88 U/L (ref 38–126)
Bilirubin, Direct: 0.1 mg/dL (ref 0.0–0.2)
Indirect Bilirubin: 0.5 mg/dL (ref 0.3–0.9)
Total Bilirubin: 0.6 mg/dL (ref 0.3–1.2)
Total Protein: 7.6 g/dL (ref 6.5–8.1)

## 2022-12-19 ENCOUNTER — Ambulatory Visit (HOSPITAL_COMMUNITY)
Admission: RE | Admit: 2022-12-19 | Discharge: 2022-12-19 | Disposition: A | Discharge status: Home / Self Care | Payer: Self-pay | Source: Ambulatory Visit | Attending: Physician Assistant | Admitting: Physician Assistant

## 2022-12-19 ENCOUNTER — Ambulatory Visit: Payer: Worker's Compensation | Admitting: Physician Assistant

## 2022-12-19 ENCOUNTER — Encounter: Payer: Self-pay | Admitting: Physician Assistant

## 2022-12-19 VITALS — BP 114/77 | HR 64 | Temp 97.9°F | Wt 195.0 lb

## 2022-12-19 DIAGNOSIS — M25552 Pain in left hip: Secondary | ICD-10-CM | POA: Insufficient documentation

## 2022-12-19 DIAGNOSIS — Z87891 Personal history of nicotine dependence: Secondary | ICD-10-CM

## 2022-12-19 DIAGNOSIS — E785 Hyperlipidemia, unspecified: Secondary | ICD-10-CM

## 2022-12-19 DIAGNOSIS — M8580 Other specified disorders of bone density and structure, unspecified site: Secondary | ICD-10-CM

## 2022-12-19 NOTE — Progress Notes (Signed)
BP 114/77   Pulse 64   Temp 97.9 F (36.6 C)   SpO2 93%    Subjective:    Patient ID: Jacob Reilly, male    DOB: 1966-09-17, 57 y.o.   MRN: [id number]  HPI: [name] is a 57 y.o. male presenting on 12/19/2022 for Hyperlipidemia   HPI  Chief Complaint  Patient presents with   Hyperlipidemia     Pt c/o left hip pain.  Bothering him a lot lately.  He had Pelvic fracture in the late 1990s and then again around 2006.  He has not had any recent injuries.    He says he quit smoking for Christmas.  He says it is going okay but he has put on some weight as a result.      Relevant past medical, surgical, family and social history reviewed and updated as indicated. Interim medical history since our last visit reviewed. Allergies and medications reviewed and updated.   Current Outpatient Medications:    albuterol (VENTOLIN HFA) 108 (90 Base) MCG/ACT inhaler, INHALE 2 PUFFS BY MOUTH EVERY 6 HOURS AS NEEDED FOR COUGHING, WHEEZING, OR SHORTNESS OF BREATH, Disp: 20.1 g, Rfl: 0   atorvastatin (LIPITOR) 20 MG tablet, TAKE 1 Tablet BY MOUTH ONCE EVERY DAY, Disp: 90 tablet, Rfl: 0   Calcium Carb-Cholecalciferol (CALCIUM 1000 + D PO), Take 1 tablet by mouth 2 (two) times daily., Disp: , Rfl:    Multiple Vitamin (MULTIVITAMIN) tablet, Take 1 tablet by mouth daily., Disp: , Rfl:    Review of Systems  Per HPI unless specifically indicated above     Objective:    BP 114/77   Pulse 64   Temp 97.9 F (36.6 C)   SpO2 93%   Wt Readings from Last 3 Encounters:  06/15/22 173 lb (78.5 kg)  07/13/21 172 lb 8 oz (78.2 kg)  06/10/21 166 lb (75.3 kg)    Physical Exam Vitals reviewed.  Constitutional:      General: He is not in acute distress.    Appearance: He is well-developed. He is not toxic-appearing.  HENT:     Head: Normocephalic and atraumatic.  Cardiovascular:     Rate and Rhythm: Normal rate and regular rhythm.  Pulmonary:     Effort: Pulmonary effort is normal.      Breath sounds: Normal breath sounds. No wheezing.  Abdominal:     General: Bowel sounds are normal.     Palpations: Abdomen is soft.     Tenderness: There is no abdominal tenderness.     Hernia: There is no hernia in the left inguinal area.  Genitourinary:    Comments: No hernia palpable Musculoskeletal:     Cervical back: Neck supple.     Right hip: No tenderness or bony tenderness. Normal range of motion.     Left hip: No tenderness or bony tenderness. Normal range of motion.     Right knee: Normal range of motion.     Left knee: Normal range of motion.     Right lower leg: No edema.     Left lower leg: No edema.  Lymphadenopathy:     Cervical: No cervical adenopathy.     Lower Body: No left inguinal adenopathy.  Skin:    General: Skin is warm and dry.  Neurological:     Mental Status: He is alert and oriented to person, place, and time.     Gait: Gait abnormal.  Psychiatric:        Behavior: Behavior  normal.     Results for orders placed or performed during the hospital encounter of 12/15/22  Hepatic function panel  Result Value Ref Range   Total Protein 7.6 6.5 - 8.1 g/dL   Albumin 4.4 3.5 - 5.0 g/dL   AST 23 15 - 41 U/L   ALT 36 0 - 44 U/L   Alkaline Phosphatase 88 38 - 126 U/L   Total Bilirubin 0.6 0.3 - 1.2 mg/dL   Bilirubin, Direct 0.1 0.0 - 0.2 mg/dL   Indirect Bilirubin 0.5 0.3 - 0.9 mg/dL  Lipid panel  Result Value Ref Range   Cholesterol 160 0 - 200 mg/dL   Triglycerides 120 <150 mg/dL   HDL 44 >40 mg/dL   Total CHOL/HDL Ratio 3.6 RATIO   VLDL 24 0 - 40 mg/dL   LDL Cholesterol 92 0 - 99 mg/dL      Assessment & Plan:   Encounter Diagnoses  Name Primary?   Hyperlipidemia, unspecified hyperlipidemia type Yes   Left hip pain    History of tobacco use disorder    Osteopenia, unspecified location      Dyslipidemia -reviewed labs with pt -he will continue atorvastatin and lowfat diet  History smoking -pt was congratulated on cessation and  encouraged to maintain it -will refer for CT screening for lung cancer  Lung nodule -Stable CT last year with no recomendation to repeat.  Pt will need CT for screening as above  osteopenia  -2017-was most recent dexa with T score  -will update dexa  Left hip pain -Xray left hip -discussed with pt may be hernia but will start with xray  uninsured -pt [facility] financial assistance expired in January.  He needs to Update cafa  Pt is scheduled to RTO in 3 months for dyslipidemia.  He is to RTO sooner prn

## 2022-12-22 ENCOUNTER — Other Ambulatory Visit: Payer: Self-pay | Admitting: Physician Assistant

## 2022-12-22 DIAGNOSIS — M25552 Pain in left hip: Secondary | ICD-10-CM

## 2022-12-29 ENCOUNTER — Telehealth: Payer: Self-pay | Admitting: Student

## 2022-12-29 NOTE — Telephone Encounter (Signed)
Pt called c/o trouble with his bowels for the past 3-4 weeks. Pt reports bowels being less regular, feeling bloated and backed up. Pt reports abd cramping pain, difficulty voiding, and increased back pain. Pt reports stool being Ribbon like. Pt's last BM being last night. Pt has not taken or tried anything for this.  PA recommends for pt to take miralax, to drink plenty of water and to call Monday morning if not improved.  LPN called and advised pt. Pt verbalized understanding.

## 2023-01-04 ENCOUNTER — Ambulatory Visit (HOSPITAL_COMMUNITY)
Admission: RE | Admit: 2023-01-04 | Discharge: 2023-01-04 | Disposition: A | Discharge status: Home / Self Care | Payer: Self-pay | Source: Ambulatory Visit | Attending: Physician Assistant | Admitting: Physician Assistant

## 2023-01-04 ENCOUNTER — Other Ambulatory Visit: Payer: Self-pay | Admitting: Physician Assistant

## 2023-01-04 DIAGNOSIS — M25552 Pain in left hip: Secondary | ICD-10-CM | POA: Insufficient documentation

## 2023-01-04 DIAGNOSIS — Z87891 Personal history of nicotine dependence: Secondary | ICD-10-CM | POA: Insufficient documentation

## 2023-01-11 ENCOUNTER — Encounter: Payer: Self-pay | Admitting: Physician Assistant

## 2023-01-11 ENCOUNTER — Ambulatory Visit: Payer: Worker's Compensation | Admitting: Physician Assistant

## 2023-01-11 VITALS — BP 126/84 | HR 67 | Temp 97.5°F | Wt 192.5 lb

## 2023-01-11 DIAGNOSIS — E785 Hyperlipidemia, unspecified: Secondary | ICD-10-CM

## 2023-01-11 DIAGNOSIS — R079 Chest pain, unspecified: Secondary | ICD-10-CM

## 2023-01-11 DIAGNOSIS — F17200 Nicotine dependence, unspecified, uncomplicated: Secondary | ICD-10-CM

## 2023-01-11 DIAGNOSIS — R6881 Early satiety: Secondary | ICD-10-CM

## 2023-01-11 DIAGNOSIS — K5900 Constipation, unspecified: Secondary | ICD-10-CM

## 2023-01-11 DIAGNOSIS — M25552 Pain in left hip: Secondary | ICD-10-CM

## 2023-01-11 DIAGNOSIS — I2510 Atherosclerotic heart disease of native coronary artery without angina pectoris: Secondary | ICD-10-CM

## 2023-01-11 NOTE — Progress Notes (Signed)
BP 126/84   Pulse 67   Temp (!) 97.5 F (36.4 C)   Wt 192 lb 8 oz (87.3 kg)   SpO2 99%   BMI 30.15 kg/m    Subjective:    Patient ID: Jacob Reilly, male    DOB: 1965/11/18, 57 y.o.   MRN: [id number]  HPI: Brogen Walsh is a 57 y.o. male presenting on 01/11/2023 for Follow-up (On hip/groin pain) and Constipation   HPI   Chief Complaint  Patient presents with   Follow-up    On hip/groin pain   Constipation     Pt says he is Still with groin and hip hurting.  Hip any time lifts or twists leg.   He is constipated - "something's off with the whole poop thing".   He says his BM is still only a little bit coming out and then it is like ribbons.  He Took miralax (daily for 5 days), is drinking water (about 3 bottles/day).  He has Some belly pain at times.  He feels full early when trying to eat.    Pt thinks his Back pain may be worse than his usual chronic pain.  He is not on chronic pain meds.    He applied for medicaid and has to get word on that before he can apply for [employer] financial assistance.    He re-started smoking.   He says he may have Some numbness and tingling BLE that comes and goes.  It clears up when he changes positions.  He has Some problems with weak stream of urine.  No pain with urinartion.  Pt had normal FIT test 06/30/2021 He has not yet returned FIT test given to him at [facility] 06/15/2022.  He applied for medicaid- he is waiting on an answer  He had some sharp pains in the chest a few weeks ago.  It lasted only a few seconds but it happened several times during the day.  He was just sitting around on the couch when that happened.  It happened again the next day also but hasn't happened again after that.    No associated sob or nausea.       Relevant past medical, surgical, family and social history reviewed and updated as indicated. Interim medical history since our last visit reviewed. Allergies and medications reviewed and  updated.   Current Outpatient Medications:    albuterol (VENTOLIN HFA) 108 (90 Base) MCG/ACT inhaler, INHALE 2 PUFFS BY MOUTH EVERY 6 HOURS AS NEEDED FOR COUGHING, WHEEZING, OR SHORTNESS OF BREATH, Disp: 20.1 g, Rfl: 0   atorvastatin (LIPITOR) 20 MG tablet, TAKE 1 Tablet BY MOUTH ONCE EVERY DAY, Disp: 90 tablet, Rfl: 0   Calcium Carb-Cholecalciferol (CALCIUM 1000 + D PO), Take 1 tablet by mouth 2 (two) times daily., Disp: , Rfl:    Multiple Vitamin (MULTIVITAMIN) tablet, Take 1 tablet by mouth daily., Disp: , Rfl:     Review of Systems  Per HPI unless specifically indicated above     Objective:    BP 126/84   Pulse 67   Temp (!) 97.5 F (36.4 C)   Wt 192 lb 8 oz (87.3 kg)   SpO2 99%   BMI 30.15 kg/m   Wt Readings from Last 3 Encounters:  01/11/23 192 lb 8 oz (87.3 kg)  12/19/22 195 lb (88.5 kg)  06/15/22 173 lb (78.5 kg)    Physical Exam Vitals reviewed. Exam conducted with a chaperone present.  Constitutional:  General: He is not in acute distress.    Appearance: He is well-developed. He is not toxic-appearing.  HENT:     Head: Normocephalic and atraumatic.  Cardiovascular:     Rate and Rhythm: Normal rate and regular rhythm.     Pulses:          Femoral pulses are 2+ on the right side and 2+ on the left side. Pulmonary:     Effort: Pulmonary effort is normal.     Breath sounds: Normal breath sounds. No wheezing.  Abdominal:     General: Bowel sounds are normal.     Palpations: Abdomen is soft. There is no mass.     Tenderness: There is no abdominal tenderness. There is no right CVA tenderness, left CVA tenderness, guarding or rebound.     Hernia: There is no hernia in the left inguinal area or right inguinal area.  Musculoskeletal:     Cervical back: Neck supple.     Right hip: Normal. No tenderness or bony tenderness. Normal range of motion.     Left hip: No tenderness or bony tenderness. Decreased range of motion.     Right upper leg: Normal.     Left  upper leg: Normal.     Right knee: Normal.     Left knee: Normal.     Right lower leg: No swelling. No edema.     Left lower leg: No swelling. No edema.     Right foot: Normal capillary refill. No swelling. Normal pulse.     Left foot: Normal capillary refill. No swelling. Normal pulse.     Comments: Some pain with ROM L hip which limits ROM  Lymphadenopathy:     Cervical: No cervical adenopathy.     Lower Body: No right inguinal adenopathy. No left inguinal adenopathy.  Skin:    General: Skin is warm and dry.  Neurological:     Mental Status: He is alert and oriented to person, place, and time.  Psychiatric:        Behavior: Behavior normal.      EKG- NSR at 66 bpm.  No st-t changes.  No changes compared with EKG done 12/27/2017          Assessment & Plan:    Encounter Diagnoses  Name Primary?   Left hip pain Yes   Constipation, unspecified constipation type    Chest pain, unspecified type    Tobacco use disorder    Hyperlipidemia, unspecified hyperlipidemia type    Early satiety    Coronary artery disease involving native heart without angina pectoris, unspecified vessel or lesion type      -reviewed results chest CT, groin [place] and hip xray with pt -recommended pt use Miralax.  He is to Increase water to 6 bottles/day -Refer to GI in light of early satiety and constipation -discussed heart disease that was on his CT scan (discussed coronary calcium and CAD).  Pt Declined referral to cardiology at this time.  He will contact office if he has any more CP -encouraged smoking cessation -discussed that hip/going pain likely muscle strain that is being very slow to heal.  Recommended ice/heat and avoiding overexertion -pt to follow up 1 month.  He is to contact office sooner prn worsening, new symptoms or other changes.

## 2023-01-12 ENCOUNTER — Encounter: Payer: Self-pay | Admitting: Gastroenterology

## 2023-01-23 NOTE — Progress Notes (Deleted)
Referring Provider: [name], PA-C Primary Care Physician:  [name], PA-C Primary Gastroenterologist:  Dr. Bonnetta Barry chief complaint on file.   HPI:   Jacob Reilly is a 57 y.o. male presenting today at the request of [name], PA-C for constipation and early satiety.  Reviewed office visit with [name], PA-C dated 01/11/2023.  Reported constipation with ribbonlike stool.  He had taken MiraLAX for 5 days.  Noted some intermittent abdominal pain.  Also reported early satiety.  Recommended continue MiraLAX daily, see GI for further evaluation of constipation and early satiety.  Today:    Prior colonoscopy:   Past Medical History:  Diagnosis Date   Anxiety    Asthma    as child-now with allergy season only.   Depression    ETOH abuse    Hyperlipidemia    Kidney calculi    Low back pain    Pneumonia    Polysubstance abuse (HCC)    PTSD (post-traumatic stress disorder)     Past Surgical History:  Procedure Laterality Date   BACK SURGERY     HYDROCELE EXCISION Left 08/01/2013   Procedure: HYDROCELECTOMY ADULT;  Surgeon: Ky Barban, MD;  Location: [facility];  Service: Urology;  Laterality: Left;   I & D EXTREMITY  10/24/2012   Procedure: IRRIGATION AND DEBRIDEMENT EXTREMITY;  Surgeon: Tami Ribas, MD;  Location: [facility];  Service: Orthopedics;  Laterality: Right;   OPEN REDUCTION INTERNAL FIXATION (ORIF) DISTAL RADIAL FRACTURE Right 05/16/2015   Procedure: OPEN REDUCTION INTERNAL FIXATION (ORIF) RIGHT DISTAL RADIUS FRACTURE;  Surgeon: Dairl Ponder, MD;  Location: [facility];  Service: Orthopedics;  Laterality: Right;   OPEN REDUCTION INTERNAL FIXATION (ORIF) HAND Right thumb   OPEN REDUCTION INTERNAL FIXATION (ORIF) METACARPAL Left 05/16/2015   Procedure: OPEN REDUCTION INTERNAL FIXATION OF MIDDLE PHALANGEAL FRACTURE, EXTENSOR TENDON REPAIR WITH ULNAR DIGITAL NERVE REPAIR MICROSCOPICALLY;  Surgeon: Dairl Ponder, MD;  Location[facility];  Service:  Orthopedics;  Laterality: Left;   SHOULDER ARTHROSCOPY WITH ROTATOR CUFF REPAIR AND SUBACROMIAL DECOMPRESSION Left 06/08/2016   Procedure: LEFT SHOULDER ARTHROSCOPY WITH ROTATOR CUFF REPAIR, SUBACROMIAL DECOMPRESSION, DISTAL CLAVICLE EXCISION;  Surgeon: Tarry Kos, MD;  Location: [facility];  Service: Orthopedics;  Laterality: Left;   THUMB FUSION Right     Current Outpatient Medications  Medication Sig Dispense Refill   albuterol (VENTOLIN HFA) 108 (90 Base) MCG/ACT inhaler INHALE 2 PUFFS BY MOUTH EVERY 6 HOURS AS NEEDED FOR COUGHING, WHEEZING, OR SHORTNESS OF BREATH 20.1 g 0   atorvastatin (LIPITOR) 20 MG tablet TAKE 1 Tablet BY MOUTH ONCE EVERY DAY 90 tablet 0   Calcium Carb-Cholecalciferol (CALCIUM 1000 + D PO) Take 1 tablet by mouth 2 (two) times daily.     Multiple Vitamin (MULTIVITAMIN) tablet Take 1 tablet by mouth daily.     No current facility-administered medications for this visit.    Allergies as of 01/26/2023 - Review Complete 01/11/2023  Allergen Reaction Noted   Penicillins Other (See Comments) 09/03/2012   Prilosec [omeprazole]  11/29/2018    Family History  Problem Relation Age of Onset   Diabetes Mother    Hypertension Mother    Obesity Mother    Heart disease Mother    Kidney disease Mother    COPD Mother    Cancer Father    Hypertension Brother     Social History   Socioeconomic History   Marital status: Divorced    Spouse name: Not on file  Number of children: Not on file   Years of education: Not on file   Highest education level: Not on file  Occupational History   Not on file  Tobacco Use   Smoking status: Some Days    Packs/day: 0.50    Years: 44.00    Additional pack years: 0.00    Total pack years: 22.00    Types: Cigarettes    Start date: 01/29/1973   Smokeless tobacco: Former    Types: Associate Professor Use: Never used  Substance and Sexual Activity   Alcohol use: Yes    Alcohol/week: 12.0 standard drinks  of alcohol    Types: 12 Cans of beer per week    Comment: hx heavy etoh august 2016   Drug use: Not Currently    Types: Marijuana, Cocaine, Other-see comments    Comment: only a time or two since august 2016   Sexual activity: Yes    Birth control/protection: None  Other Topics Concern   Not on file  Social History Narrative   Not on file   Social Determinants of Health   Financial Resource Strain: Not on file  Food Insecurity: Not on file  Transportation Needs: Not on file  Physical Activity: Not on file  Stress: Not on file  Social Connections: Not on file  Intimate Partner Violence: Not on file    Review of Systems: Gen: Denies any fever, chills, fatigue, weight loss, lack of appetite.  CV: Denies chest pain, heart palpitations, peripheral edema, syncope.  Resp: Denies shortness of breath at rest or with exertion. Denies wheezing or cough.  GI: Denies dysphagia or odynophagia. Denies jaundice, hematemesis, fecal incontinence. GU : Denies urinary burning, urinary frequency, urinary hesitancy MS: Denies joint pain, muscle weakness, cramps, or limitation of movement.  Derm: Denies rash, itching, dry skin Psych: Denies depression, anxiety, memory loss, and confusion Heme: Denies bruising, bleeding, and enlarged lymph nodes.  Physical Exam: There were no vitals taken for this visit. General:   Alert and oriented. Pleasant and cooperative. Well-nourished and well-developed.  Head:  Normocephalic and atraumatic. Eyes:  Without icterus, sclera clear and conjunctiva pink.  Ears:  Normal auditory acuity. Lungs:  Clear to auscultation bilaterally. No wheezes, rales, or rhonchi. No distress.  Heart:  S1, S2 present without murmurs appreciated.  Abdomen:  +BS, soft, non-tender and non-distended. No HSM noted. No guarding or rebound. No masses appreciated.  Rectal:  Deferred  Msk:  Symmetrical without gross deformities. Normal posture. Extremities:  Without edema. Neurologic:   Alert and  oriented x4;  grossly normal neurologically. Skin:  Intact without significant lesions or rashes. Psych:  Alert and cooperative. Normal mood and affect.    Assessment:     Plan:  ***   Ermalinda Memos, PA-C [facility] 01/26/2023

## 2023-01-26 ENCOUNTER — Ambulatory Visit: Payer: Medicaid Other | Admitting: Gastroenterology

## 2023-02-07 ENCOUNTER — Ambulatory Visit: Payer: Worker's Compensation | Admitting: Physician Assistant

## 2023-02-13 ENCOUNTER — Ambulatory Visit: Payer: Medicaid Other | Admitting: Internal Medicine

## 2023-03-21 ENCOUNTER — Ambulatory Visit: Payer: Worker's Compensation | Admitting: Physician Assistant

## 2023-06-21 ENCOUNTER — Other Ambulatory Visit: Payer: Medicaid Other

## 2024-04-11 ENCOUNTER — Other Ambulatory Visit (HOSPITAL_COMMUNITY): Payer: Self-pay | Admitting: Physician Assistant

## 2024-04-11 DIAGNOSIS — Z87898 Personal history of other specified conditions: Secondary | ICD-10-CM

## 2024-05-09 ENCOUNTER — Ambulatory Visit (HOSPITAL_COMMUNITY)
Admission: RE | Admit: 2024-05-09 | Discharge: 2024-05-09 | Disposition: A | Discharge status: Home / Self Care | Source: Ambulatory Visit | Attending: Physician Assistant | Admitting: Physician Assistant

## 2024-05-09 DIAGNOSIS — Z87898 Personal history of other specified conditions: Secondary | ICD-10-CM | POA: Insufficient documentation

## 2024-08-23 ENCOUNTER — Other Ambulatory Visit (HOSPITAL_COMMUNITY): Payer: Self-pay | Admitting: Physician Assistant

## 2024-08-23 DIAGNOSIS — R918 Other nonspecific abnormal finding of lung field: Secondary | ICD-10-CM

## 2024-09-04 ENCOUNTER — Ambulatory Visit: Admitting: Orthopaedic Surgery

## 2024-09-05 ENCOUNTER — Other Ambulatory Visit (INDEPENDENT_AMBULATORY_CARE_PROVIDER_SITE_OTHER): Payer: Self-pay

## 2024-09-05 ENCOUNTER — Ambulatory Visit: Admitting: Orthopaedic Surgery

## 2024-09-05 DIAGNOSIS — G8929 Other chronic pain: Secondary | ICD-10-CM

## 2024-09-05 DIAGNOSIS — M25512 Pain in left shoulder: Secondary | ICD-10-CM | POA: Diagnosis not present

## 2024-09-05 NOTE — Progress Notes (Signed)
 Office Visit Note   Patient: Jacob Reilly           Date of Birth: Mar 08, 1966           MRN: [id number] Visit Date: 09/05/2024              Requested by: [address] O'Laf, PA-C [address] [place],  [place] [address] PCP: Carol Catalan, PA-C   Assessment & Plan: Visit Diagnoses:  1. Chronic left shoulder pain     Plan: History of Present Illness Jacob Reilly is a 58 year old male who presents with worsening left shoulder pain.  Over the past six months, he experiences severe pain in his left shoulder, described as feeling like 'ground glass' and as if the shoulder is 'ready to pop out,' especially when getting up or bending over.  He underwent left shoulder surgery in 2017, including rotator cuff repair, distal clavicle excision, and subacromial decompression. He has had multiple rotator cuff tears and a total separation previously repaired.  He has osteopenia, noted during previous surgery when bone was described as 'real soft' during anchor placement. He denies recent trauma or exceeding post-surgery limitations.  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 No diabetes.  Physical Exam MUSCULOSKELETAL: Left shoulder range of motion limited with pain.  Assessment and Plan Rotator cuff arthropathy left shoulder Explained that this is a difficult problem given his age and activity level.   - Referred to Dr. Addie for evaluation and discussion of treatment options.  Follow-Up Instructions: No follow-ups on file.   Orders:  Orders Placed This Encounter  Procedures   XR Shoulder Left   No orders of the defined types were placed in this encounter.     Procedures: No procedures performed   Clinical Data: No additional findings.   Subjective: Chief Complaint  Patient presents with   Left Shoulder - Pain    HPI  Review of Systems  Constitutional: Negative.   HENT: Negative.    Eyes:  Negative.   Respiratory: Negative.    Cardiovascular: Negative.   Gastrointestinal: Negative.   Endocrine: Negative.   Genitourinary: Negative.   Skin: Negative.   Allergic/Immunologic: Negative.   Neurological: Negative.   Hematological: Negative.   Psychiatric/Behavioral: Negative.    All other systems reviewed and are negative.    Objective: Vital Signs: There were no vitals taken for this visit.  Physical Exam Vitals and nursing note reviewed.  Constitutional:      Appearance: He is well-developed.  HENT:     Head: Normocephalic and atraumatic.  Eyes:     Pupils: Pupils are equal, round, and reactive to light.  Pulmonary:     Effort: Pulmonary effort is normal.  Abdominal:     Palpations: Abdomen is soft.  Musculoskeletal:        General: Normal range of motion.     Cervical back: Neck supple.  Skin:    General: Skin is warm.  Neurological:     Mental Status: He is alert and oriented to person, place, and time.  Psychiatric:        Behavior: Behavior normal.        Thought Content: Thought content normal.        Judgment: Judgment normal.     Ortho Exam  Specialty Comments:  No specialty comments available.  Imaging: XR Shoulder Left Result Date: 09/05/2024 X-rays of the left shoulder show advanced degenerative changes of  the glenohumeral joint.  Humeral head articulating with the acromion suggestive of rotator cuff arthropathy.   Large hill sachs lesion.      PMFS History: Patient Active Problem List   Diagnosis Date Noted   DOE (dyspnea on exertion) 11/26/2018   Solitary pulmonary nodule on lung CT 11/26/2018   Hyperlipidemia 02/10/2016   Low back pain 02/10/2016   Cigarette nicotine  dependence with nicotine -induced disorder 02/10/2016   Chronic pain 01/15/2016   Anxiety disorder 01/15/2016   Polysubstance abuse (HCC) 04/06/2015   Substance induced mood disorder (HCC) 04/06/2015   Homicidal ideation    MRSA infection 12/24/2012   Cigarette  smoker 12/24/2012   Habitual alcohol use 12/24/2012   Finger osteomyelitis, right (HCC) 11/26/2012   Past Medical History:  Diagnosis Date   Anxiety    Asthma    as child-now with allergy season only.   Depression    ETOH abuse    Hyperlipidemia    Kidney calculi    Low back pain    Pneumonia    Polysubstance abuse (HCC)    PTSD (post-traumatic stress disorder)     Family History  Problem Relation Age of Onset   Diabetes Mother    Hypertension Mother    Obesity Mother    Heart disease Mother    Kidney disease Mother    COPD Mother    Cancer Father    Hypertension Brother     Past Surgical History:  Procedure Laterality Date   BACK SURGERY     HYDROCELE EXCISION Left 08/01/2013   Procedure: HYDROCELECTOMY ADULT;  Surgeon: Mohammad I Javaid, MD;  Location: [facility];  Service: Urology;  Laterality: Left;   I & D EXTREMITY  10/24/2012   Procedure: IRRIGATION AND DEBRIDEMENT EXTREMITY;  Surgeon: Franky JONELLE Curia, MD;  [facility]: [facility];  Service: Orthopedics;  Laterality: Right;   OPEN REDUCTION INTERNAL FIXATION (ORIF) DISTAL RADIAL FRACTURE Right 05/16/2015   Procedure: OPEN REDUCTION INTERNAL FIXATION (ORIF) RIGHT DISTAL RADIUS FRACTURE;  Surgeon: Donnice Robinsons, MD;  Location: [facility];  Service: Orthopedics;  Laterality: Right;   OPEN REDUCTION INTERNAL FIXATION (ORIF) HAND Right thumb   OPEN REDUCTION INTERNAL FIXATION (ORIF) METACARPAL Left 05/16/2015   Procedure: OPEN REDUCTION INTERNAL FIXATION OF MIDDLE PHALANGEAL FRACTURE, EXTENSOR TENDON REPAIR WITH ULNAR DIGITAL NERVE REPAIR MICROSCOPICALLY;  Surgeon: Donnice Robinsons, MD;  Location: [facility];  Service: Orthopedics;  Laterality: Left;   SHOULDER ARTHROSCOPY WITH ROTATOR CUFF REPAIR AND SUBACROMIAL DECOMPRESSION Left 06/08/2016   Procedure: LEFT SHOULDER ARTHROSCOPY WITH ROTATOR CUFF REPAIR, SUBACROMIAL DECOMPRESSION, DISTAL CLAVICLE EXCISION;  Surgeon: Kay CHRISTELLA Cummins, MD;  Location: [facility];  Service: Orthopedics;   Laterality: Left;   THUMB FUSION Right    Social History   Occupational History   Not on file  Tobacco Use   Smoking status: Some Days    Current packs/day: 0.50    Average packs/day: 0.5 packs/day for 51.6 years (25.8 ttl pk-yrs)    Types: Cigarettes    Start date: 01/29/1973   Smokeless tobacco: Former    Types: Engineer, Drilling   Vaping status: Never Used  Substance and Sexual Activity   Alcohol use: Yes    Alcohol/week: 12.0 standard drinks of alcohol    Types: 12 Cans of beer per week    Comment: hx heavy etoh august 2016   Drug use: Not Currently    Types: Marijuana, Cocaine, Other-see comments    Comment: only a time or two since august 2016  Sexual activity: Yes    Birth control/protection: None

## 2024-09-06 ENCOUNTER — Telehealth: Payer: Self-pay | Admitting: Orthopaedic Surgery

## 2024-09-06 NOTE — Telephone Encounter (Signed)
 [facility] called stating they received a faxed referral but this pt has never been seen their before. Unsure of why referral was sent . [facility] phone number is [phone]

## 2024-09-06 NOTE — Telephone Encounter (Signed)
 Don't see where Dr.Xu placed a referral to primary care.

## 2024-10-03 ENCOUNTER — Ambulatory Visit: Admitting: Orthopedic Surgery

## 2024-10-03 DIAGNOSIS — G8929 Other chronic pain: Secondary | ICD-10-CM

## 2024-10-03 DIAGNOSIS — M25512 Pain in left shoulder: Secondary | ICD-10-CM | POA: Diagnosis not present

## 2024-10-04 ENCOUNTER — Telehealth: Payer: Self-pay | Admitting: Orthopedic Surgery

## 2024-10-04 ENCOUNTER — Encounter: Payer: Self-pay | Admitting: Orthopedic Surgery

## 2024-10-04 NOTE — Progress Notes (Unsigned)
 "  Office Visit Note   Patient: Jacob Reilly           Date of Birth: May 16, 1966           MRN: [id number] Visit Date: 10/03/2024 Requested by: [place], O'Laf, PA-C [address] [place],  [place] [address] PCP: Carol Catalan, PA-C  Subjective: Chief Complaint  Patient presents with   Left Shoulder - Pain    HPI: Jacob Reilly is a 58 y.o. male who presents to the office reporting Severe left shoulder pain.  Been going on for many years but worse over the last 6 months.  Over-the-counter medication without relief.  Has a history of humeral shaft fracture as well as rotator cuff surgery in 2017.  Describes both pain and weakness.  Has significantly diminished range of motion in the left shoulder for activities of daily living.  Pain wakes him from sleep on a regular basis.  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                ROS: All systems reviewed are negative as they relate to the chief complaint within the history of present illness.  Patient denies fevers or chills.  Assessment & Plan: Visit Diagnoses:  1. Chronic left shoulder pain     Plan: Impression is left shoulder severe arthritis with healed humeral shaft fracture distal to the deltoid insertion.  Plan is reverse shoulder replacement.  Risk and benefits are discussed with the patient including not limited to infection nerve or vessel damage incomplete pain relief as well as incomplete restoration of function.  I think it may be difficult for subscap reattachment based on lack of external rotation at this time.  Right shoulder is functional.  Thin cut CT scan pending for evaluation of glenoid vault anatomy in the left shoulder.  He is currently taking calcium  and vitamin D for known history of osteopenia.  Follow-Up Instructions: No follow-ups on file.   Orders:  Orders Placed This  Encounter  Procedures   CT SHOULDER LEFT WO CONTRAST   No orders of the defined types were placed in this encounter.     Procedures: No procedures performed   Clinical Data: No additional findings.  Objective: Vital Signs: There were no vitals taken for this visit.  Physical Exam:  Constitutional: Patient appears well-developed HEENT:  Head: Normocephalic Eyes:EOM are normal Neck: Normal range of motion Cardiovascular: Normal rate Pulmonary/chest: Effort normal Neurologic: Patient is alert Skin: Skin is warm Psychiatric: Patient has normal mood and affect  Ortho Exam: Ortho exam demonstrates range of motion of the right of 65/110/170 with slight amount of crepitus with internal and external rotation at 90 degrees abduction.  Passive range of motion on the left is negative 5/95/130.  Active forward flexion to 70 and active abduction is 45.  External rotation strength on the left is 4 out of 5 subscap strength on the left is 5 out of 5.  Motor sensory function of the hand is intact.  Specialty Comments:  No specialty comments available.  Imaging: No results found.   PMFS History: Patient Active Problem List   Diagnosis Date Noted   DOE (dyspnea on exertion) 11/26/2018   Solitary pulmonary nodule on lung CT 11/26/2018   Hyperlipidemia 02/10/2016   Low back pain 02/10/2016   Cigarette nicotine  dependence with nicotine -induced disorder 02/10/2016  Chronic pain 01/15/2016   Anxiety disorder 01/15/2016   Polysubstance abuse (HCC) 04/06/2015   Substance induced mood disorder (HCC) 04/06/2015   Homicidal ideation    MRSA infection 12/24/2012   Cigarette smoker 12/24/2012   Habitual alcohol use 12/24/2012   Finger osteomyelitis, right (HCC) 11/26/2012   Past Medical History:  Diagnosis Date   Anxiety    Asthma    as child-now with allergy season only.   Depression    ETOH abuse    Hyperlipidemia    Kidney calculi    Low back pain    Pneumonia     Polysubstance abuse (HCC)    PTSD (post-traumatic stress disorder)     Family History  Problem Relation Age of Onset   Diabetes Mother    Hypertension Mother    Obesity Mother    Heart disease Mother    Kidney disease Mother    COPD Mother    Cancer Father    Hypertension Brother     Past Surgical History:  Procedure Laterality Date   BACK SURGERY     HYDROCELE EXCISION Left 08/01/2013   Procedure: HYDROCELECTOMY ADULT;  Surgeon: Mohammad I Javaid, MD;  Location: [facility];  Service: Urology;  Laterality: Left;   I & D EXTREMITY  10/24/2012   Procedure: IRRIGATION AND DEBRIDEMENT EXTREMITY;  Surgeon: Franky JONELLE Curia, MD;  Location: [facility];  Service: Orthopedics;  Laterality: Right;   OPEN REDUCTION INTERNAL FIXATION (ORIF) DISTAL RADIAL FRACTURE Right 05/16/2015   Procedure: OPEN REDUCTION INTERNAL FIXATION (ORIF) RIGHT DISTAL RADIUS FRACTURE;  Surgeon: Donnice Robinsons, MD;  Location: [facility];  Service: Orthopedics;  Laterality: Right;   OPEN REDUCTION INTERNAL FIXATION (ORIF) HAND Right thumb   OPEN REDUCTION INTERNAL FIXATION (ORIF) METACARPAL Left 05/16/2015   Procedure: OPEN REDUCTION INTERNAL FIXATION OF MIDDLE PHALANGEAL FRACTURE, EXTENSOR TENDON REPAIR WITH ULNAR DIGITAL NERVE REPAIR MICROSCOPICALLY;  Surgeon: Donnice Robinsons, MD;  Location: [facility];  Service: Orthopedics;  Laterality: Left;   SHOULDER ARTHROSCOPY WITH ROTATOR CUFF REPAIR AND SUBACROMIAL DECOMPRESSION Left 06/08/2016   Procedure: LEFT SHOULDER ARTHROSCOPY WITH ROTATOR CUFF REPAIR, SUBACROMIAL DECOMPRESSION, DISTAL CLAVICLE EXCISION;  Surgeon: Kay CHRISTELLA Cummins, MD;  Location: [facility];  Service: Orthopedics;  Laterality: Left;   THUMB FUSION Right    Social History   Occupational History   Not on file  Tobacco Use   Smoking status: Some Days    Current packs/day: 0.50    Average packs/day: 0.5 packs/day for 51.7 years (25.8 ttl pk-yrs)    Types: Cigarettes    Start date: 01/29/1973   Smokeless tobacco:  Former    Types: Engineer, Drilling   Vaping status: Never Used  Substance and Sexual Activity   Alcohol use: Yes    Alcohol/week: 12.0 standard drinks of alcohol    Types: 12 Cans of beer per week    Comment: hx heavy etoh august 2016   Drug use: Not Currently    Types: Marijuana, Cocaine, Other-see comments    Comment: only a time or two since august 2016   Sexual activity: Yes    Birth control/protection: None        "

## 2024-10-04 NOTE — Telephone Encounter (Signed)
 Called patient to discuss surgery date.  He has not been scheduled for the scan yet.  He will call once he has an appointment with imaging.  Patient has my name and direct number for scheduling.

## 2024-10-05 ENCOUNTER — Encounter: Payer: Self-pay | Admitting: Orthopedic Surgery

## 2024-10-09 ENCOUNTER — Ambulatory Visit (HOSPITAL_COMMUNITY)
Admission: RE | Admit: 2024-10-09 | Discharge: 2024-10-09 | Disposition: A | Discharge status: Home / Self Care | Source: Ambulatory Visit | Attending: Physician Assistant | Admitting: Physician Assistant

## 2024-10-09 DIAGNOSIS — R918 Other nonspecific abnormal finding of lung field: Secondary | ICD-10-CM | POA: Insufficient documentation

## 2024-10-29 ENCOUNTER — Ambulatory Visit
Admission: RE | Admit: 2024-10-29 | Discharge: 2024-10-29 | Disposition: A | Source: Ambulatory Visit | Attending: Orthopedic Surgery | Admitting: Orthopedic Surgery

## 2024-10-29 DIAGNOSIS — G8929 Other chronic pain: Secondary | ICD-10-CM

## 2024-11-06 ENCOUNTER — Ambulatory Visit: Payer: Self-pay | Admitting: Orthopedic Surgery

## 2024-11-06 NOTE — Progress Notes (Signed)
 Hi Debbie.  Scan done can you call and see if we can get him scheduled.  Thanks

## 2024-12-24 ENCOUNTER — Ambulatory Visit (HOSPITAL_COMMUNITY): Admit: 2024-12-24 | Admitting: Orthopedic Surgery

## 2025-01-08 ENCOUNTER — Encounter: Admitting: Orthopedic Surgery
